# Patient Record
Sex: Female | Born: 1940 | Race: White | Hispanic: No | Marital: Married | Smoking: Former smoker
Health system: Southern US, Community
[De-identification: ages and names within clinical notes are randomized; demographics above are authoritative.]

## PROBLEM LIST (undated history)

## (undated) DIAGNOSIS — I701 Atherosclerosis of renal artery: Secondary | ICD-10-CM

## (undated) DIAGNOSIS — Z923 Personal history of irradiation: Secondary | ICD-10-CM

## (undated) DIAGNOSIS — J9611 Chronic respiratory failure with hypoxia: Secondary | ICD-10-CM

## (undated) DIAGNOSIS — R569 Unspecified convulsions: Secondary | ICD-10-CM

## (undated) DIAGNOSIS — N950 Postmenopausal bleeding: Secondary | ICD-10-CM

## (undated) DIAGNOSIS — Z9981 Dependence on supplemental oxygen: Secondary | ICD-10-CM

## (undated) DIAGNOSIS — Z720 Tobacco use: Secondary | ICD-10-CM

## (undated) DIAGNOSIS — C50919 Malignant neoplasm of unspecified site of unspecified female breast: Secondary | ICD-10-CM

## (undated) DIAGNOSIS — I700 Atherosclerosis of aorta: Secondary | ICD-10-CM

## (undated) DIAGNOSIS — R64 Cachexia: Secondary | ICD-10-CM

## (undated) DIAGNOSIS — I2510 Atherosclerotic heart disease of native coronary artery without angina pectoris: Secondary | ICD-10-CM

## (undated) DIAGNOSIS — J449 Chronic obstructive pulmonary disease, unspecified: Secondary | ICD-10-CM

## (undated) HISTORY — DX: Chronic obstructive pulmonary disease, unspecified: J44.9

## (undated) HISTORY — DX: Dependence on supplemental oxygen: Z99.81

## (undated) HISTORY — DX: Atherosclerosis of aorta: I70.0

## (undated) HISTORY — PX: APPENDECTOMY: SHX54

## (undated) HISTORY — PX: BREAST BIOPSY: SHX20

## (undated) HISTORY — PX: OVARIAN CYST SURGERY: SHX726

## (undated) HISTORY — PX: BREAST LUMPECTOMY: SHX2

## (undated) HISTORY — PX: OTHER SURGICAL HISTORY: SHX169

---

## 2002-09-27 DIAGNOSIS — C50919 Malignant neoplasm of unspecified site of unspecified female breast: Secondary | ICD-10-CM

## 2002-09-27 HISTORY — PX: BREAST BIOPSY: SHX20

## 2002-09-27 HISTORY — DX: Malignant neoplasm of unspecified site of unspecified female breast: C50.919

## 2004-06-27 ENCOUNTER — Ambulatory Visit: Payer: Self-pay | Admitting: Internal Medicine

## 2004-10-02 ENCOUNTER — Ambulatory Visit: Payer: Self-pay | Admitting: Internal Medicine

## 2004-12-22 ENCOUNTER — Ambulatory Visit: Payer: Self-pay | Admitting: Internal Medicine

## 2004-12-26 ENCOUNTER — Ambulatory Visit: Payer: Self-pay | Admitting: Internal Medicine

## 2005-03-01 ENCOUNTER — Ambulatory Visit: Payer: Self-pay | Admitting: Family Medicine

## 2005-06-23 ENCOUNTER — Ambulatory Visit: Payer: Self-pay | Admitting: Internal Medicine

## 2005-06-27 ENCOUNTER — Ambulatory Visit: Payer: Self-pay | Admitting: Internal Medicine

## 2005-07-28 ENCOUNTER — Ambulatory Visit: Payer: Self-pay | Admitting: Internal Medicine

## 2005-08-27 ENCOUNTER — Ambulatory Visit: Payer: Self-pay | Admitting: Internal Medicine

## 2005-09-27 ENCOUNTER — Ambulatory Visit: Payer: Self-pay | Admitting: Internal Medicine

## 2005-10-28 ENCOUNTER — Ambulatory Visit: Payer: Self-pay | Admitting: Internal Medicine

## 2005-11-25 ENCOUNTER — Ambulatory Visit: Payer: Self-pay | Admitting: Internal Medicine

## 2005-12-26 ENCOUNTER — Ambulatory Visit: Payer: Self-pay | Admitting: Internal Medicine

## 2006-03-08 ENCOUNTER — Ambulatory Visit: Payer: Self-pay | Admitting: Internal Medicine

## 2006-03-27 ENCOUNTER — Ambulatory Visit: Payer: Self-pay | Admitting: Internal Medicine

## 2006-09-07 ENCOUNTER — Ambulatory Visit: Payer: Self-pay | Admitting: Internal Medicine

## 2006-09-27 ENCOUNTER — Ambulatory Visit: Payer: Self-pay | Admitting: Internal Medicine

## 2006-12-21 ENCOUNTER — Ambulatory Visit: Payer: Self-pay | Admitting: Internal Medicine

## 2006-12-26 ENCOUNTER — Ambulatory Visit: Payer: Self-pay | Admitting: Internal Medicine

## 2007-02-26 ENCOUNTER — Ambulatory Visit: Payer: Self-pay | Admitting: Internal Medicine

## 2007-03-22 ENCOUNTER — Ambulatory Visit: Payer: Self-pay | Admitting: Internal Medicine

## 2007-03-28 ENCOUNTER — Ambulatory Visit: Payer: Self-pay | Admitting: Internal Medicine

## 2007-07-18 ENCOUNTER — Ambulatory Visit: Payer: Self-pay | Admitting: Family Medicine

## 2007-07-26 ENCOUNTER — Ambulatory Visit: Payer: Self-pay | Admitting: General Surgery

## 2007-09-28 ENCOUNTER — Ambulatory Visit: Payer: Self-pay | Admitting: Internal Medicine

## 2007-10-04 ENCOUNTER — Ambulatory Visit: Payer: Self-pay | Admitting: Internal Medicine

## 2007-10-29 ENCOUNTER — Ambulatory Visit: Payer: Self-pay | Admitting: Internal Medicine

## 2007-11-26 ENCOUNTER — Ambulatory Visit: Payer: Self-pay | Admitting: Internal Medicine

## 2008-01-26 ENCOUNTER — Ambulatory Visit: Payer: Self-pay | Admitting: Internal Medicine

## 2008-01-31 ENCOUNTER — Ambulatory Visit: Payer: Self-pay | Admitting: General Surgery

## 2008-03-27 ENCOUNTER — Ambulatory Visit: Payer: Self-pay | Admitting: Internal Medicine

## 2008-04-02 ENCOUNTER — Ambulatory Visit: Payer: Self-pay | Admitting: Internal Medicine

## 2008-04-27 ENCOUNTER — Ambulatory Visit: Payer: Self-pay | Admitting: Internal Medicine

## 2010-12-25 ENCOUNTER — Ambulatory Visit: Payer: Self-pay | Admitting: Family Medicine

## 2011-01-19 ENCOUNTER — Ambulatory Visit: Payer: Self-pay | Admitting: Family Medicine

## 2011-01-21 ENCOUNTER — Ambulatory Visit: Payer: Self-pay | Admitting: Family Medicine

## 2011-02-01 ENCOUNTER — Ambulatory Visit: Payer: Self-pay | Admitting: Family Medicine

## 2012-02-18 ENCOUNTER — Ambulatory Visit: Payer: Self-pay | Admitting: Family Medicine

## 2012-09-29 ENCOUNTER — Ambulatory Visit: Payer: Self-pay | Admitting: Family Medicine

## 2014-02-25 ENCOUNTER — Ambulatory Visit: Payer: Self-pay | Admitting: Family Medicine

## 2014-11-15 DIAGNOSIS — Z23 Encounter for immunization: Secondary | ICD-10-CM | POA: Diagnosis not present

## 2014-11-15 DIAGNOSIS — M4850XA Collapsed vertebra, not elsewhere classified, site unspecified, initial encounter for fracture: Secondary | ICD-10-CM | POA: Diagnosis not present

## 2014-11-15 DIAGNOSIS — M41125 Adolescent idiopathic scoliosis, thoracolumbar region: Secondary | ICD-10-CM | POA: Diagnosis not present

## 2014-11-15 DIAGNOSIS — E785 Hyperlipidemia, unspecified: Secondary | ICD-10-CM | POA: Diagnosis not present

## 2014-11-15 DIAGNOSIS — J432 Centrilobular emphysema: Secondary | ICD-10-CM | POA: Diagnosis not present

## 2014-11-15 DIAGNOSIS — Z0000 Encounter for general adult medical examination without abnormal findings: Secondary | ICD-10-CM | POA: Diagnosis not present

## 2014-12-03 ENCOUNTER — Ambulatory Visit: Payer: Self-pay | Admitting: Family Medicine

## 2014-12-03 DIAGNOSIS — M810 Age-related osteoporosis without current pathological fracture: Secondary | ICD-10-CM | POA: Diagnosis not present

## 2015-05-11 ENCOUNTER — Other Ambulatory Visit: Payer: Self-pay | Admitting: Family Medicine

## 2015-05-11 DIAGNOSIS — J441 Chronic obstructive pulmonary disease with (acute) exacerbation: Secondary | ICD-10-CM

## 2015-05-12 ENCOUNTER — Other Ambulatory Visit: Payer: Self-pay

## 2015-05-12 DIAGNOSIS — J449 Chronic obstructive pulmonary disease, unspecified: Secondary | ICD-10-CM

## 2015-05-12 MED ORDER — ALBUTEROL SULFATE HFA 108 (90 BASE) MCG/ACT IN AERS: 2.0000 | INHALATION_SPRAY | Freq: Four times a day (QID) | RESPIRATORY_TRACT | Status: DC | PRN

## 2015-06-19 ENCOUNTER — Encounter: Payer: Self-pay | Admitting: Family Medicine

## 2015-06-19 ENCOUNTER — Ambulatory Visit (INDEPENDENT_AMBULATORY_CARE_PROVIDER_SITE_OTHER): Payer: Medicare Other | Admitting: Family Medicine

## 2015-06-19 VITALS — BP 100/60 | HR 98 | Ht 62.0 in | Wt 80.0 lb

## 2015-06-19 DIAGNOSIS — J449 Chronic obstructive pulmonary disease, unspecified: Secondary | ICD-10-CM

## 2015-06-19 DIAGNOSIS — R310 Gross hematuria: Secondary | ICD-10-CM | POA: Diagnosis not present

## 2015-06-19 DIAGNOSIS — N3090 Cystitis, unspecified without hematuria: Secondary | ICD-10-CM

## 2015-06-19 DIAGNOSIS — J441 Chronic obstructive pulmonary disease with (acute) exacerbation: Secondary | ICD-10-CM | POA: Diagnosis not present

## 2015-06-19 LAB — POCT URINALYSIS DIPSTICK
BILIRUBIN UA: NEGATIVE
GLUCOSE UA: NEGATIVE
KETONES UA: NEGATIVE
NITRITE UA: NEGATIVE
PH UA: 6
Protein, UA: NEGATIVE
Spec Grav, UA: 1.01
Urobilinogen, UA: 0.2

## 2015-06-19 MED ORDER — SULFAMETHOXAZOLE-TRIMETHOPRIM 800-160 MG PO TABS: 1.0000 | ORAL_TABLET | Freq: Two times a day (BID) | ORAL | Status: DC

## 2015-06-19 MED ORDER — ALBUTEROL SULFATE HFA 108 (90 BASE) MCG/ACT IN AERS: 1.0000 | INHALATION_SPRAY | RESPIRATORY_TRACT | Status: DC | PRN

## 2015-06-19 NOTE — Progress Notes (Signed)
Name: Gabriella Wiggins   MRN: [id number]    DOB: [birth year]   Date:06/19/2015       Progress Note  Subjective  Chief Complaint  Chief Complaint  Patient presents with  . Urinary Tract Infection    blood in urine- started 2 days ago    Urinary Tract Infection  Associated symptoms include frequency and hematuria. Pertinent negatives include no chills, discharge, hesitancy, nausea, possible pregnancy or urgency.  Urinary Frequency  This is a new problem. The current episode started in the past 7 days. The problem occurs intermittently. The problem has been gradually improving. Quality: nodysuria. Associated symptoms include frequency and hematuria. Pertinent negatives include no chills, discharge, hesitancy, nausea, possible pregnancy or urgency. She has tried acetaminophen for the symptoms. The treatment provided mild relief.    No problem-specific assessment & plan notes found for this encounter.   Past Medical History  Diagnosis Date  . COPD (chronic obstructive pulmonary disease)   . Cancer 2003    breast    Past Surgical History  Procedure Laterality Date  . Ovarian cyst surgery      Family History  Problem Relation Age of Onset  . Heart disease Father   . Cancer Sister   . Diabetes Brother     Social History   Social History  . Marital Status: Married    Spouse Name: N/A  . Number of Children: N/A  . Years of Education: N/A   Occupational History  . Not on file.   Social History Main Topics  . Smoking status: Current Every Day Smoker  . Smokeless tobacco: Not on file  . Alcohol Use: No  . Drug Use: No  . Sexual Activity: Yes   Other Topics Concern  . Not on file   Social History Narrative  . No narrative on file    Allergies  Allergen Reactions  . Penicillins      Review of Systems  Constitutional: Negative for fever, chills, weight loss and malaise/fatigue.  HENT: Negative for ear discharge, ear pain and sore throat.   Eyes: Negative for blurred  vision.  Respiratory: Negative for cough, sputum production, shortness of breath and wheezing.   Cardiovascular: Negative for chest pain, palpitations and leg swelling.  Gastrointestinal: Negative for heartburn, nausea, abdominal pain, diarrhea, constipation, blood in stool and melena.  Genitourinary: Positive for frequency and hematuria. Negative for dysuria, hesitancy and urgency.  Musculoskeletal: Negative for myalgias, back pain, joint pain and neck pain.  Skin: Negative for rash.  Neurological: Negative for dizziness, tingling, sensory change, focal weakness and headaches.  Endo/Heme/Allergies: Negative for environmental allergies and polydipsia. Does not bruise/bleed easily.  Psychiatric/Behavioral: Negative for depression and suicidal ideas. The patient is not nervous/anxious and does not have insomnia.      Objective  Filed Vitals:   06/19/15 1104  BP: 100/60  Pulse: 98  Height: 5\' 2"  (1.575 m)  Weight: 80 lb (36.288 kg)    Physical Exam  Constitutional: She is well-developed, well-nourished, and in no distress. No distress.  HENT:  Head: Normocephalic and atraumatic.  Right Ear: External ear normal.  Left Ear: External ear normal.  Nose: Nose normal.  Mouth/Throat: Oropharynx is clear and moist.  Eyes: Conjunctivae and EOM are normal. Pupils are equal, round, and reactive to light. Right eye exhibits no discharge. Left eye exhibits no discharge.  Neck: Normal range of motion. Neck supple. No JVD present. No thyromegaly present.  Cardiovascular: Normal rate, regular rhythm, normal heart sounds and intact distal  pulses.  Exam reveals no gallop and no friction rub.   No murmur heard. Pulmonary/Chest: Effort normal and breath sounds normal.  Abdominal: Soft. Bowel sounds are normal. She exhibits no mass. There is no tenderness. There is no guarding.  Musculoskeletal: Normal range of motion. She exhibits no edema.  Lymphadenopathy:    She has no cervical adenopathy.   Neurological: She is alert. She has normal reflexes.  Skin: Skin is warm and dry. She is not diaphoretic.  Psychiatric: Mood and affect normal.      Assessment & Plan  Problem List Items Addressed This Visit    None    Visit Diagnoses    Cystitis    -  Primary    Relevant Medications    sulfamethoxazole-trimethoprim (BACTRIM DS,SEPTRA DS) 800-160 MG per tablet    Other Relevant Orders    POCT Urinalysis Dipstick (Completed)    Gross hematuria        Relevant Medications    sulfamethoxazole-trimethoprim (BACTRIM DS,SEPTRA DS) 800-160 MG per tablet    Other Relevant Orders    POCT Urinalysis Dipstick (Completed)    Ambulatory referral to Urology    Chronic obstructive pulmonary disease, unspecified COPD, unspecified chronic bronchitis type        Relevant Medications    albuterol (PROAIR HFA) 108 (90 BASE) MCG/ACT inhaler    Chronic obstructive pulmonary disease with acute exacerbation        Relevant Medications    albuterol (PROAIR HFA) 108 (90 BASE) MCG/ACT inhaler         Dr. Deanna Jones [facility]  06/19/2015

## 2015-06-26 ENCOUNTER — Ambulatory Visit (INDEPENDENT_AMBULATORY_CARE_PROVIDER_SITE_OTHER): Payer: Medicare Other

## 2015-06-26 DIAGNOSIS — R319 Hematuria, unspecified: Secondary | ICD-10-CM

## 2015-06-26 LAB — POCT URINALYSIS DIPSTICK
Bilirubin, UA: NEGATIVE
Glucose, UA: NEGATIVE
Ketones, UA: NEGATIVE
Leukocytes, UA: NEGATIVE
NITRITE UA: NEGATIVE
PH UA: 6
PROTEIN UA: NEGATIVE
SPEC GRAV UA: 1.01
UROBILINOGEN UA: 0.2

## 2015-06-26 NOTE — Progress Notes (Signed)
Pt dropped off urine to be rechecked- will proceed with appointment with Dr Bernardo Heater

## 2015-07-24 DIAGNOSIS — R310 Gross hematuria: Secondary | ICD-10-CM | POA: Diagnosis not present

## 2015-07-24 DIAGNOSIS — R319 Hematuria, unspecified: Secondary | ICD-10-CM | POA: Diagnosis not present

## 2015-07-24 HISTORY — DX: Gross hematuria: R31.0

## 2015-08-05 DIAGNOSIS — R310 Gross hematuria: Secondary | ICD-10-CM | POA: Diagnosis not present

## 2015-08-28 DIAGNOSIS — R310 Gross hematuria: Secondary | ICD-10-CM | POA: Diagnosis not present

## 2015-10-06 ENCOUNTER — Ambulatory Visit: Payer: Medicare Other | Admitting: Family Medicine

## 2015-10-08 ENCOUNTER — Encounter: Payer: Self-pay | Admitting: Family Medicine

## 2015-10-08 ENCOUNTER — Ambulatory Visit (INDEPENDENT_AMBULATORY_CARE_PROVIDER_SITE_OTHER): Payer: PPO | Admitting: Family Medicine

## 2015-10-08 VITALS — BP 120/80 | HR 72 | Ht 62.0 in | Wt 83.0 lb

## 2015-10-08 DIAGNOSIS — J449 Chronic obstructive pulmonary disease, unspecified: Secondary | ICD-10-CM | POA: Diagnosis not present

## 2015-10-08 DIAGNOSIS — Z789 Other specified health status: Secondary | ICD-10-CM

## 2015-10-08 DIAGNOSIS — Z13220 Encounter for screening for lipoid disorders: Secondary | ICD-10-CM | POA: Diagnosis not present

## 2015-10-08 MED ORDER — ALBUTEROL SULFATE HFA 108 (90 BASE) MCG/ACT IN AERS: 2.0000 | INHALATION_SPRAY | Freq: Four times a day (QID) | RESPIRATORY_TRACT | Status: DC | PRN

## 2015-10-08 MED ORDER — FLUTICASONE-SALMETEROL 250-50 MCG/DOSE IN AEPB: 1.0000 | INHALATION_SPRAY | Freq: Two times a day (BID) | RESPIRATORY_TRACT | Status: DC

## 2015-10-08 MED ORDER — IPRATROPIUM-ALBUTEROL 20-100 MCG/ACT IN AERS: 1.0000 | INHALATION_SPRAY | Freq: Four times a day (QID) | RESPIRATORY_TRACT | Status: DC

## 2015-10-08 NOTE — Progress Notes (Signed)
Name: Gabriella Wiggins   MRN: [id number]    DOB: 1941/03/25   Date:10/08/2015       Progress Note  Subjective  Chief Complaint  Chief Complaint  Patient presents with  . COPD    Shortness of Breath Pertinent negatives include no abdominal pain, chest pain, ear pain, fever, headaches, hemoptysis, leg swelling, neck pain, rash, sore throat, sputum production or wheezing.    No problem-specific assessment & plan notes found for this encounter.   Past Medical History  Diagnosis Date  . COPD (chronic obstructive pulmonary disease) ([place])   . Cancer [facility]) 2003    breast    Past Surgical History  Procedure Laterality Date  . Ovarian cyst surgery      Family History  Problem Relation Age of Onset  . Heart disease Father   . Cancer Sister   . Diabetes Brother     Social History   Social History  . Marital Status: Married    Spouse Name: N/A  . Number of Children: N/A  . Years of Education: N/A   Occupational History  . Not on file.   Social History Main Topics  . Smoking status: Current Every Day Smoker  . Smokeless tobacco: Not on file  . Alcohol Use: No  . Drug Use: No  . Sexual Activity: Yes   Other Topics Concern  . Not on file   Social History Narrative    Allergies  Allergen Reactions  . Penicillins      Review of Systems  Constitutional: Negative for fever, chills, weight loss and malaise/fatigue.  HENT: Negative for ear discharge, ear pain and sore throat.   Eyes: Negative for blurred vision.  Respiratory: Positive for shortness of breath. Negative for cough, hemoptysis, sputum production and wheezing.   Cardiovascular: Negative for chest pain, palpitations and leg swelling.  Gastrointestinal: Negative for heartburn, nausea, abdominal pain, diarrhea, constipation, blood in stool and melena.  Genitourinary: Negative for dysuria, urgency, frequency and hematuria.  Musculoskeletal: Negative for myalgias, back pain, joint pain and neck pain.  Skin:  Negative for rash.  Neurological: Negative for dizziness, tingling, sensory change, focal weakness and headaches.  Endo/Heme/Allergies: Negative for environmental allergies and polydipsia. Does not bruise/bleed easily.  Psychiatric/Behavioral: Negative for depression and suicidal ideas. The patient is not nervous/anxious and does not have insomnia.      Objective  Filed Vitals:   10/08/15 1109  BP: 120/80  Pulse: 72  Height: 5\' 2"  (1.575 m)  Weight: 83 lb (37.649 kg)    Physical Exam  Constitutional: She is well-developed, well-nourished, and in no distress. No distress.  HENT:  Head: Normocephalic and atraumatic.  Right Ear: External ear normal.  Left Ear: External ear normal.  Nose: Nose normal.  Mouth/Throat: Oropharynx is clear and moist.  Eyes: Conjunctivae and EOM are normal. Pupils are equal, round, and reactive to light. Right eye exhibits no discharge. Left eye exhibits no discharge.  Neck: Normal range of motion. Neck supple. No JVD present. No thyromegaly present.  Cardiovascular: Normal rate, regular rhythm, normal heart sounds and intact distal pulses.  Exam reveals no gallop and no friction rub.   No murmur heard. Pulmonary/Chest: Effort normal and breath sounds normal. No respiratory distress. She has no wheezes. She has no rales. She exhibits no tenderness.  Abdominal: Soft. Bowel sounds are normal. She exhibits no mass. There is no tenderness. There is no guarding.  Musculoskeletal: Normal range of motion. She exhibits no edema.  Lymphadenopathy:    She  has no cervical adenopathy.  Neurological: She is alert. She has normal reflexes.  Skin: Skin is warm and dry. She is not diaphoretic.  Psychiatric: Mood and affect normal.  Nursing note and vitals reviewed.     Assessment & Plan  Problem List Items Addressed This Visit    None    Visit Diagnoses    Chronic obstructive pulmonary disease, unspecified COPD type ([place])    -  Primary    Relevant Medications     Ipratropium-Albuterol (COMBIVENT RESPIMAT) 20-100 MCG/ACT AERS respimat    albuterol (PROVENTIL HFA;VENTOLIN HFA) 108 (90 Base) MCG/ACT inhaler    Fluticasone-Salmeterol (ADVAIR DISKUS) 250-50 MCG/DOSE AEPB    Chronic obstructive pulmonary disease, unspecified COPD, unspecified chronic bronchitis type        Relevant Medications    Ipratropium-Albuterol (COMBIVENT RESPIMAT) 20-100 MCG/ACT AERS respimat    albuterol (PROVENTIL HFA;VENTOLIN HFA) 108 (90 Base) MCG/ACT inhaler    Fluticasone-Salmeterol (ADVAIR DISKUS) 250-50 MCG/DOSE AEPB    Lipid screening        Relevant Orders    Lipid Profile    Health maintenance alteration        Relevant Orders    Renal Function Panel    Lipid Profile         Dr. Otilio Miu [facility]  10/08/2015

## 2015-10-09 LAB — RENAL FUNCTION PANEL
ALBUMIN: 4.5 g/dL (ref 3.5–4.8)
BUN/Creatinine Ratio: 21 (ref 11–26)
BUN: 15 mg/dL (ref 8–27)
CHLORIDE: 94 mmol/L — AB (ref 96–106)
CO2: 29 mmol/L (ref 18–29)
Calcium: 10 mg/dL (ref 8.7–10.3)
Creatinine, Ser: 0.72 mg/dL (ref 0.57–1.00)
GFR, EST AFRICAN AMERICAN: 97 mL/min/{1.73_m2} (ref >59)
GFR, EST NON AFRICAN AMERICAN: 84 mL/min/{1.73_m2} (ref >59)
Glucose: 73 mg/dL (ref 65–99)
Phosphorus: 3.5 mg/dL (ref 2.5–4.5)
Potassium: 4.5 mmol/L (ref 3.5–5.2)
Sodium: 141 mmol/L (ref 134–144)

## 2015-10-09 LAB — LIPID PANEL
Chol/HDL Ratio: 3.4 ratio units (ref 0.0–4.4)
Cholesterol, Total: 244 mg/dL — ABNORMAL HIGH (ref 100–199)
HDL: 71 mg/dL (ref >39)
LDL Calculated: 153 mg/dL — ABNORMAL HIGH (ref 0–99)
TRIGLYCERIDES: 99 mg/dL (ref 0–149)
VLDL CHOLESTEROL CAL: 20 mg/dL (ref 5–40)

## 2016-04-06 DIAGNOSIS — H524 Presbyopia: Secondary | ICD-10-CM | POA: Diagnosis not present

## 2016-04-06 DIAGNOSIS — H353121 Nonexudative age-related macular degeneration, left eye, early dry stage: Secondary | ICD-10-CM | POA: Diagnosis not present

## 2016-04-06 DIAGNOSIS — H25813 Combined forms of age-related cataract, bilateral: Secondary | ICD-10-CM | POA: Diagnosis not present

## 2016-04-08 ENCOUNTER — Encounter: Payer: Self-pay | Admitting: Family Medicine

## 2016-04-08 ENCOUNTER — Ambulatory Visit (INDEPENDENT_AMBULATORY_CARE_PROVIDER_SITE_OTHER): Payer: PPO | Admitting: Family Medicine

## 2016-04-08 VITALS — BP 124/70 | HR 84 | Ht 62.0 in | Wt 85.0 lb

## 2016-04-08 DIAGNOSIS — Z1239 Encounter for other screening for malignant neoplasm of breast: Secondary | ICD-10-CM | POA: Diagnosis not present

## 2016-04-08 DIAGNOSIS — J449 Chronic obstructive pulmonary disease, unspecified: Secondary | ICD-10-CM

## 2016-04-08 DIAGNOSIS — J432 Centrilobular emphysema: Secondary | ICD-10-CM

## 2016-04-08 MED ORDER — FLUTICASONE-SALMETEROL 250-50 MCG/DOSE IN AEPB: 1.0000 | INHALATION_SPRAY | Freq: Two times a day (BID) | RESPIRATORY_TRACT | Status: DC

## 2016-04-08 MED ORDER — IPRATROPIUM-ALBUTEROL 20-100 MCG/ACT IN AERS: 1.0000 | INHALATION_SPRAY | Freq: Four times a day (QID) | RESPIRATORY_TRACT | Status: DC

## 2016-04-08 MED ORDER — ALBUTEROL SULFATE HFA 108 (90 BASE) MCG/ACT IN AERS: 2.0000 | INHALATION_SPRAY | Freq: Four times a day (QID) | RESPIRATORY_TRACT | Status: DC | PRN

## 2016-04-08 NOTE — Progress Notes (Signed)
Name: Gabriella Wiggins   MRN: [id number]    DOB: 1941/07/13   Date:04/08/2016       Progress Note  Subjective  Chief Complaint  Chief Complaint  Patient presents with  . COPD    Shortness of Breath This is a chronic problem. The current episode started more than 1 year ago. The problem occurs intermittently. The problem has been waxing and waning. Associated symptoms include coryza. Pertinent negatives include no abdominal pain, chest pain, claudication, ear pain, fever, headaches, hemoptysis, leg pain, leg swelling, neck pain, orthopnea, PND, rash, rhinorrhea, sore throat, sputum production, swollen glands, syncope, vomiting or wheezing. The symptoms are aggravated by weather changes. The patient has no known risk factors for DVT/PE. She has tried beta agonist inhalers and steroid inhalers for the symptoms. The treatment provided moderate relief. Her past medical history is significant for asthma and COPD. There is no history of allergies, aspirin allergies, bronchiolitis, CAD, chronic lung disease, DVT, a heart failure, PE, pneumonia or a recent surgery.    No problem-specific assessment & plan notes found for this encounter.   Past Medical History  Diagnosis Date  . COPD (chronic obstructive pulmonary disease) ([place])   . Cancer [facility]) 2003    breast    Past Surgical History  Procedure Laterality Date  . Ovarian cyst surgery      Family History  Problem Relation Age of Onset  . Heart disease Father   . Cancer Sister   . Diabetes Brother     Social History   Social History  . Marital Status: Married    Spouse Name: N/A  . Number of Children: N/A  . Years of Education: N/A   Occupational History  . Not on file.   Social History Main Topics  . Smoking status: Current Every Day Smoker  . Smokeless tobacco: Not on file  . Alcohol Use: No  . Drug Use: No  . Sexual Activity: Yes   Other Topics Concern  . Not on file   Social History Narrative    Allergies  Allergen  Reactions  . Penicillins      Review of Systems  Constitutional: Negative for fever, chills, weight loss and malaise/fatigue.  HENT: Negative for ear discharge, ear pain, rhinorrhea and sore throat.   Eyes: Negative for blurred vision.  Respiratory: Positive for shortness of breath. Negative for cough, hemoptysis, sputum production and wheezing.   Cardiovascular: Negative for chest pain, palpitations, orthopnea, claudication, leg swelling, syncope and PND.  Gastrointestinal: Negative for heartburn, nausea, vomiting, abdominal pain, diarrhea, constipation, blood in stool and melena.  Genitourinary: Negative for dysuria, urgency, frequency and hematuria.  Musculoskeletal: Negative for myalgias, back pain, joint pain and neck pain.  Skin: Negative for rash.  Neurological: Negative for dizziness, tingling, sensory change, focal weakness and headaches.  Endo/Heme/Allergies: Negative for environmental allergies and polydipsia. Does not bruise/bleed easily.  Psychiatric/Behavioral: Negative for depression and suicidal ideas. The patient is not nervous/anxious and does not have insomnia.      Objective  Filed Vitals:   04/08/16 1013  BP: 124/70  Pulse: 84  Height: 5\' 2"  (1.575 m)  Weight: 85 lb (38.556 kg)    Physical Exam  Constitutional: She is well-developed, well-nourished, and in no distress. No distress.  HENT:  Head: Normocephalic and atraumatic.  Right Ear: Tympanic membrane, external ear and ear canal normal.  Left Ear: Tympanic membrane, external ear and ear canal normal.  Nose: Nose normal.  Mouth/Throat: Oropharynx is clear and moist. No posterior  oropharyngeal edema or posterior oropharyngeal erythema.  Eyes: Conjunctivae and EOM are normal. Pupils are equal, round, and reactive to light. Right eye exhibits no discharge. Left eye exhibits no discharge.  Neck: Normal range of motion. Neck supple. No JVD present. No thyromegaly present.  Cardiovascular: Normal rate,  regular rhythm, normal heart sounds and intact distal pulses.  Exam reveals no gallop and no friction rub.   No murmur heard. Pulmonary/Chest: Effort normal and breath sounds normal. No respiratory distress. She has no wheezes. She has no rales. She exhibits no tenderness. Right breast exhibits no inverted nipple, no mass, no nipple discharge, no skin change and no tenderness. Left breast exhibits no inverted nipple, no mass, no nipple discharge, no skin change and no tenderness. Breasts are symmetrical.  Abdominal: Soft. Bowel sounds are normal. She exhibits no mass. There is no hepatosplenomegaly. There is no tenderness. There is no guarding.  Musculoskeletal: Normal range of motion. She exhibits no edema.  Lymphadenopathy:    She has no cervical adenopathy.  Neurological: She is alert. She has normal reflexes.  Skin: Skin is warm and dry. She is not diaphoretic.  Psychiatric: Mood and affect normal.  Nursing note and vitals reviewed.     Assessment & Plan  Problem List Items Addressed This Visit    None    Visit Diagnoses    Centrilobular emphysema (HCC)    -  Primary    Relevant Medications    albuterol (PROVENTIL HFA;VENTOLIN HFA) 108 (90 Base) MCG/ACT inhaler    Fluticasone-Salmeterol (ADVAIR DISKUS) 250-50 MCG/DOSE AEPB    Ipratropium-Albuterol (COMBIVENT RESPIMAT) 20-100 MCG/ACT AERS respimat    Chronic obstructive pulmonary disease, unspecified COPD, unspecified chronic bronchitis type        Relevant Medications    albuterol (PROVENTIL HFA;VENTOLIN HFA) 108 (90 Base) MCG/ACT inhaler    Fluticasone-Salmeterol (ADVAIR DISKUS) 250-50 MCG/DOSE AEPB    Ipratropium-Albuterol (COMBIVENT RESPIMAT) 20-100 MCG/ACT AERS respimat    Breast cancer screening        Relevant Orders    MM Digital Screening         Dr. Deanna Jones [facility]  04/08/2016

## 2016-04-16 DIAGNOSIS — H3530 Unspecified macular degeneration: Secondary | ICD-10-CM | POA: Diagnosis not present

## 2016-04-16 DIAGNOSIS — H25812 Combined forms of age-related cataract, left eye: Secondary | ICD-10-CM | POA: Diagnosis not present

## 2016-04-16 DIAGNOSIS — H25811 Combined forms of age-related cataract, right eye: Secondary | ICD-10-CM | POA: Diagnosis not present

## 2016-04-22 ENCOUNTER — Ambulatory Visit
Admission: RE | Admit: 2016-04-22 | Discharge: 2016-04-22 | Disposition: A | Discharge status: Home / Self Care | Payer: PPO | Source: Ambulatory Visit | Attending: Family Medicine | Admitting: Family Medicine

## 2016-04-22 DIAGNOSIS — Z1239 Encounter for other screening for malignant neoplasm of breast: Secondary | ICD-10-CM | POA: Diagnosis not present

## 2016-04-22 DIAGNOSIS — Z1231 Encounter for screening mammogram for malignant neoplasm of breast: Secondary | ICD-10-CM | POA: Diagnosis not present

## 2016-04-22 HISTORY — DX: Malignant neoplasm of unspecified site of unspecified female breast: C50.919

## 2016-04-29 DIAGNOSIS — H25812 Combined forms of age-related cataract, left eye: Secondary | ICD-10-CM | POA: Diagnosis not present

## 2016-04-29 DIAGNOSIS — H2512 Age-related nuclear cataract, left eye: Secondary | ICD-10-CM | POA: Diagnosis not present

## 2016-07-01 DIAGNOSIS — H25811 Combined forms of age-related cataract, right eye: Secondary | ICD-10-CM | POA: Diagnosis not present

## 2016-07-01 DIAGNOSIS — H2511 Age-related nuclear cataract, right eye: Secondary | ICD-10-CM | POA: Diagnosis not present

## 2016-07-12 DIAGNOSIS — H25811 Combined forms of age-related cataract, right eye: Secondary | ICD-10-CM | POA: Diagnosis not present

## 2016-07-12 DIAGNOSIS — Z961 Presence of intraocular lens: Secondary | ICD-10-CM | POA: Diagnosis not present

## 2016-09-09 ENCOUNTER — Ambulatory Visit (INDEPENDENT_AMBULATORY_CARE_PROVIDER_SITE_OTHER): Payer: PPO | Admitting: Family Medicine

## 2016-09-09 ENCOUNTER — Encounter: Payer: Self-pay | Admitting: Family Medicine

## 2016-09-09 DIAGNOSIS — J432 Centrilobular emphysema: Secondary | ICD-10-CM

## 2016-09-09 MED ORDER — IPRATROPIUM-ALBUTEROL 20-100 MCG/ACT IN AERS: 1.0000 | INHALATION_SPRAY | Freq: Four times a day (QID) | RESPIRATORY_TRACT | 6 refills | Status: DC

## 2016-09-09 MED ORDER — FLUTICASONE-SALMETEROL 250-50 MCG/DOSE IN AEPB: 1.0000 | INHALATION_SPRAY | Freq: Two times a day (BID) | RESPIRATORY_TRACT | 6 refills | Status: DC

## 2016-09-09 MED ORDER — ALBUTEROL SULFATE HFA 108 (90 BASE) MCG/ACT IN AERS: 2.0000 | INHALATION_SPRAY | Freq: Four times a day (QID) | RESPIRATORY_TRACT | 6 refills | Status: DC | PRN

## 2016-09-09 NOTE — Progress Notes (Signed)
Name: Gabriella Wiggins   MRN: [id number]    DOB: 05/26/1941   Date:09/09/2016       Progress Note  Subjective  Chief Complaint  Chief Complaint  Patient presents with  . COPD    Patient presents for copd evaluation and medication refill.    No problem-specific Assessment & Plan notes found for this encounter.   Past Medical History:  Diagnosis Date  . Breast cancer ([place])    left lumpectomy  . Cancer [facility]) 2003   breast  . COPD (chronic obstructive pulmonary disease) ([place])     Past Surgical History:  Procedure Laterality Date  . BREAST BIOPSY Right    milk duct removed -neg  . BREAST BIOPSY Left 2004   lumpectomy/rad  . OVARIAN CYST SURGERY      Family History  Problem Relation Age of Onset  . Heart disease Father   . Cancer Sister   . Breast cancer Sister     2 sisters  . Diabetes Brother   . Breast cancer Maternal Aunt   . Breast cancer Paternal Aunt   . Breast cancer Cousin     Social History   Social History  . Marital status: Married    Spouse name: N/A  . Number of children: N/A  . Years of education: N/A   Occupational History  . Not on file.   Social History Main Topics  . Smoking status: Current Every Day Smoker  . Smokeless tobacco: Not on file  . Alcohol use No  . Drug use: No  . Sexual activity: Yes   Other Topics Concern  . Not on file   Social History Narrative  . No narrative on file    Allergies  Allergen Reactions  . Penicillins      Review of Systems  Constitutional: Negative for chills, fever, malaise/fatigue and weight loss.  HENT: Negative for ear discharge, ear pain and sore throat.   Eyes: Negative for blurred vision.  Respiratory: Negative for cough, sputum production, shortness of breath and wheezing.   Cardiovascular: Negative for chest pain, palpitations and leg swelling.  Gastrointestinal: Negative for abdominal pain, blood in stool, constipation, diarrhea, heartburn, melena and nausea.  Genitourinary:  Negative for dysuria, frequency, hematuria and urgency.  Musculoskeletal: Negative for back pain, joint pain, myalgias and neck pain.  Skin: Negative for rash.  Neurological: Negative for dizziness, tingling, sensory change, focal weakness and headaches.  Endo/Heme/Allergies: Negative for environmental allergies and polydipsia. Does not bruise/bleed easily.  Psychiatric/Behavioral: Negative for depression and suicidal ideas. The patient is not nervous/anxious and does not have insomnia.      Objective  Vitals:   09/09/16 1004  BP: 130/86  Pulse: 88  Weight: 83 lb (37.6 kg)  Height: 5\' 2"  (1.575 m)    Physical Exam  Constitutional: She is well-developed, well-nourished, and in no distress. No distress.  HENT:  Head: Normocephalic and atraumatic.  Right Ear: External ear normal.  Left Ear: External ear normal.  Nose: Nose normal.  Mouth/Throat: Oropharynx is clear and moist.  Eyes: Conjunctivae and EOM are normal. Pupils are equal, round, and reactive to light. Right eye exhibits no discharge. Left eye exhibits no discharge.  Neck: Normal range of motion. Neck supple. No JVD present. No thyromegaly present.  Cardiovascular: Normal rate, regular rhythm, normal heart sounds and intact distal pulses.  Exam reveals no gallop and no friction rub.   No murmur heard. Pulmonary/Chest: Effort normal and breath sounds normal. No respiratory distress. She has no  wheezes. She has no rales. She exhibits no tenderness.  Abdominal: Soft. Bowel sounds are normal. She exhibits no mass. There is no tenderness. There is no guarding.  Musculoskeletal: Normal range of motion. She exhibits no edema.  Lymphadenopathy:    She has no cervical adenopathy.  Neurological: She is alert.  Skin: Skin is warm and dry. She is not diaphoretic.  Psychiatric: Mood and affect normal.  Nursing note and vitals reviewed.     Assessment & Plan  Problem List Items Addressed This Visit      Respiratory    Centrilobular emphysema (HCC)   Relevant Medications   albuterol (PROVENTIL HFA;VENTOLIN HFA) 108 (90 Base) MCG/ACT inhaler   Fluticasone-Salmeterol (ADVAIR DISKUS) 250-50 MCG/DOSE AEPB   Ipratropium-Albuterol (COMBIVENT RESPIMAT) 20-100 MCG/ACT AERS respimat        Dr. Macon [facility]  09/09/16

## 2017-03-28 ENCOUNTER — Other Ambulatory Visit: Payer: Self-pay | Admitting: Family Medicine

## 2017-03-28 DIAGNOSIS — Z1231 Encounter for screening mammogram for malignant neoplasm of breast: Secondary | ICD-10-CM

## 2017-04-04 ENCOUNTER — Ambulatory Visit
Admission: RE | Admit: 2017-04-04 | Discharge: 2017-04-04 | Disposition: A | Discharge status: Home / Self Care | Payer: PPO | Source: Ambulatory Visit | Attending: Family Medicine | Admitting: Family Medicine

## 2017-04-04 ENCOUNTER — Ambulatory Visit (INDEPENDENT_AMBULATORY_CARE_PROVIDER_SITE_OTHER): Payer: PPO | Admitting: Family Medicine

## 2017-04-04 ENCOUNTER — Encounter: Payer: Self-pay | Admitting: Family Medicine

## 2017-04-04 VITALS — BP 120/84 | HR 80 | Ht 62.0 in | Wt 85.0 lb

## 2017-04-04 DIAGNOSIS — R05 Cough: Secondary | ICD-10-CM | POA: Diagnosis not present

## 2017-04-04 DIAGNOSIS — F17210 Nicotine dependence, cigarettes, uncomplicated: Secondary | ICD-10-CM

## 2017-04-04 DIAGNOSIS — J432 Centrilobular emphysema: Secondary | ICD-10-CM | POA: Insufficient documentation

## 2017-04-04 DIAGNOSIS — W57XXXA Bitten or stung by nonvenomous insect and other nonvenomous arthropods, initial encounter: Secondary | ICD-10-CM | POA: Diagnosis not present

## 2017-04-04 DIAGNOSIS — J40 Bronchitis, not specified as acute or chronic: Secondary | ICD-10-CM | POA: Diagnosis not present

## 2017-04-04 DIAGNOSIS — Z23 Encounter for immunization: Secondary | ICD-10-CM

## 2017-04-04 DIAGNOSIS — I700 Atherosclerosis of aorta: Secondary | ICD-10-CM

## 2017-04-04 MED ORDER — BUPROPION HCL 75 MG PO TABS: 75.0000 mg | ORAL_TABLET | Freq: Two times a day (BID) | ORAL | 6 refills | Status: DC

## 2017-04-04 MED ORDER — ALBUTEROL SULFATE HFA 108 (90 BASE) MCG/ACT IN AERS: 2.0000 | INHALATION_SPRAY | Freq: Four times a day (QID) | RESPIRATORY_TRACT | 6 refills | Status: DC | PRN

## 2017-04-04 MED ORDER — IPRATROPIUM-ALBUTEROL 20-100 MCG/ACT IN AERS: 1.0000 | INHALATION_SPRAY | Freq: Four times a day (QID) | RESPIRATORY_TRACT | 6 refills | Status: DC

## 2017-04-04 NOTE — Progress Notes (Signed)
Name: Gabriella Wiggins   MRN: [id number]    DOB: Apr 24, 1941   Date:04/04/2017       Progress Note  Subjective  Chief Complaint  Chief Complaint  Patient presents with  . COPD  . Nicotine Dependence    would like to try Wellbutrin    Nicotine Dependence  Presents for initial visit. Symptoms include cravings. Symptoms are negative for insomnia and sore throat. Preferred tobacco types include cigarettes. Preferred cigarette types include filtered. Her urge triggers include company of smokers. She smokes < 1/2 a pack of cigarettes per day. She started smoking when she was 28-47 years old. Past treatments include nicotine patch. Compliance with prior treatments has been good. Gabriella Wiggins is ready to quit. Gabriella Wiggins has tried to quit 5 or more times.  Shortness of Breath  This is a chronic problem. The current episode started more than 1 year ago. The problem occurs daily. The problem has been gradually worsening. Pertinent negatives include no abdominal pain, chest pain, ear pain, fever, headaches, leg swelling, neck pain, rash, sore throat, sputum production or wheezing. Risk factors include smoking. She has tried beta agonist inhalers and steroid inhalers for the symptoms. The treatment provided moderate relief. Her past medical history is significant for COPD.  Cough  This is a chronic problem. The current episode started more than 1 month ago. The problem has been waxing and waning. Episode frequency: primarily every morning. The cough is non-productive. Associated symptoms include shortness of breath. Pertinent negatives include no chest pain, chills, ear pain, fever, headaches, heartburn, myalgias, rash, sore throat, weight loss or wheezing. She has tried a beta-agonist inhaler and steroid inhaler for the symptoms. The treatment provided mild relief. Her past medical history is significant for COPD. There is no history of environmental allergies.  Rash  This is a new problem. The current episode started in the  past 7 days. The problem is unchanged. The affected locations include the left shoulder. The rash is characterized by redness and itchiness. Associated symptoms include shortness of breath. Pertinent negatives include no cough, diarrhea, fever, joint pain or sore throat.    No problem-specific Assessment & Plan notes found for this encounter.   Past Medical History:  Diagnosis Date  . Breast cancer ([place])    left lumpectomy  . Cancer [facility]) 2003   breast  . COPD (chronic obstructive pulmonary disease) ([place])     Past Surgical History:  Procedure Laterality Date  . BREAST BIOPSY Right    milk duct removed -neg  . BREAST BIOPSY Left 2004   lumpectomy/rad  . OVARIAN CYST SURGERY      Family History  Problem Relation Age of Onset  . Heart disease Father   . Cancer Sister   . Breast cancer Sister        2 sisters  . Diabetes Brother   . Breast cancer Maternal Aunt   . Breast cancer Paternal Aunt   . Breast cancer Cousin     Social History   Social History  . Marital status: Married    Spouse name: N/A  . Number of children: N/A  . Years of education: N/A   Occupational History  . Not on file.   Social History Main Topics  . Smoking status: Current Every Day Smoker  . Smokeless tobacco: Never Used     Comment: chantix didn't help and patches- she is allergic to the adhesive  . Alcohol use No  . Drug use: No  . Sexual activity: Yes  Other Topics Concern  . Not on file   Social History Narrative  . No narrative on file    Allergies  Allergen Reactions  . Penicillins     Outpatient Medications Prior to Visit  Medication Sig Dispense Refill  . albuterol (PROVENTIL HFA;VENTOLIN HFA) 108 (90 Base) MCG/ACT inhaler Inhale 2 puffs into the lungs every 6 (six) hours as needed for wheezing or shortness of breath. 1 Inhaler 6  . Ipratropium-Albuterol (COMBIVENT RESPIMAT) 20-100 MCG/ACT AERS respimat Inhale 1 puff into the lungs every 6 (six) hours. 1 Inhaler 6  .  Fluticasone-Salmeterol (ADVAIR DISKUS) 250-50 MCG/DOSE AEPB Inhale 1 puff into the lungs 2 (two) times daily. (Patient not taking: Reported on 04/04/2017) 1 each 6   No facility-administered medications prior to visit.     Review of Systems  Constitutional: Negative for chills, fever, malaise/fatigue and weight loss.  HENT: Negative for ear discharge, ear pain and sore throat.   Eyes: Negative for blurred vision.  Respiratory: Positive for shortness of breath. Negative for cough, sputum production and wheezing.   Cardiovascular: Negative for chest pain, palpitations and leg swelling.  Gastrointestinal: Negative for abdominal pain, blood in stool, constipation, diarrhea, heartburn, melena and nausea.  Genitourinary: Negative for dysuria, frequency, hematuria and urgency.  Musculoskeletal: Negative for back pain, joint pain, myalgias and neck pain.  Skin: Negative for rash.  Neurological: Negative for dizziness, tingling, sensory change, focal weakness and headaches.  Endo/Heme/Allergies: Negative for environmental allergies and polydipsia. Does not bruise/bleed easily.  Psychiatric/Behavioral: Negative for depression and suicidal ideas. The patient is not nervous/anxious and does not have insomnia.      Objective  Vitals:   04/04/17 1103  BP: 120/84  Pulse: 80  Weight: 85 lb (38.6 kg)  Height: 5\' 2"  (1.575 m)    Physical Exam  Constitutional: She is well-developed, well-nourished, and in no distress. No distress.  HENT:  Head: Normocephalic and atraumatic.  Right Ear: External ear normal.  Left Ear: External ear normal.  Nose: Nose normal.  Mouth/Throat: Oropharynx is clear and moist.  Eyes: Conjunctivae and EOM are normal. Pupils are equal, round, and reactive to light. Right eye exhibits no discharge. Left eye exhibits no discharge.  Neck: Normal range of motion. Neck supple. No JVD present. No thyromegaly present.  Cardiovascular: Normal rate, regular rhythm, normal heart  sounds and intact distal pulses.  Exam reveals no gallop and no friction rub.   No murmur heard. Pulmonary/Chest: Effort normal. She has decreased breath sounds. She has no wheezes. She has no rhonchi. She has no rales. She exhibits no tenderness.  Abdominal: Soft. Bowel sounds are normal. She exhibits no mass. There is no tenderness. There is no guarding.  Musculoskeletal: Normal range of motion. She exhibits no edema.  Lymphadenopathy:    She has no cervical adenopathy.  Neurological: She is alert. She has normal reflexes.  Skin: Skin is warm and dry. She is not diaphoretic. There is erythema.  Psychiatric: Mood and affect normal.  Nursing note and vitals reviewed.     Assessment & Plan  Problem List Items Addressed This Visit      Respiratory   Centrilobular emphysema ([place]) - Primary   Relevant Medications   Ipratropium-Albuterol (COMBIVENT RESPIMAT) 20-100 MCG/ACT AERS respimat   albuterol (PROVENTIL HFA;VENTOLIN HFA) 108 (90 Base) MCG/ACT inhaler   Other Relevant Orders   DG Chest 2 View    Other Visit Diagnoses    Bronchitis       Relevant Orders  DG Chest 2 View   Insect bite, initial encounter       Relevant Orders   Tdap vaccine greater than or equal to 7yo IM (Completed)   Need for diphtheria-tetanus-pertussis (Tdap) vaccine       Relevant Orders   Tdap vaccine greater than or equal to 7yo IM (Completed)   Cigarette nicotine dependence without complication       Relevant Medications   buPROPion (WELLBUTRIN) 75 MG tablet      Meds ordered this encounter  Medications  . Ipratropium-Albuterol (COMBIVENT RESPIMAT) 20-100 MCG/ACT AERS respimat    Sig: Inhale 1 puff into the lungs every 6 (six) hours.    Dispense:  1 Inhaler    Refill:  6  . albuterol (PROVENTIL HFA;VENTOLIN HFA) 108 (90 Base) MCG/ACT inhaler    Sig: Inhale 2 puffs into the lungs every 6 (six) hours as needed for wheezing or shortness of breath.    Dispense:  1 Inhaler    Refill:  6    sched  appt for Sept  . buPROPion (WELLBUTRIN) 75 MG tablet    Sig: Take 1 tablet (75 mg total) by mouth 2 (two) times daily.    Dispense:  60 tablet    Refill:  6      Dr. Otilio Miu [facility]  04/04/17

## 2017-04-08 ENCOUNTER — Ambulatory Visit (INDEPENDENT_AMBULATORY_CARE_PROVIDER_SITE_OTHER): Payer: PPO | Admitting: Family Medicine

## 2017-04-08 ENCOUNTER — Ambulatory Visit
Admission: RE | Admit: 2017-04-08 | Discharge: 2017-04-08 | Disposition: A | Discharge status: Home / Self Care | Payer: PPO | Source: Ambulatory Visit | Attending: Family Medicine | Admitting: Family Medicine

## 2017-04-08 ENCOUNTER — Encounter: Payer: Self-pay | Admitting: Family Medicine

## 2017-04-08 VITALS — BP 120/70 | HR 100 | Ht 62.0 in | Wt 85.0 lb

## 2017-04-08 DIAGNOSIS — G5621 Lesion of ulnar nerve, right upper limb: Secondary | ICD-10-CM | POA: Insufficient documentation

## 2017-04-08 DIAGNOSIS — M5032 Other cervical disc degeneration, mid-cervical region, unspecified level: Secondary | ICD-10-CM | POA: Diagnosis not present

## 2017-04-08 DIAGNOSIS — M5030 Other cervical disc degeneration, unspecified cervical region: Secondary | ICD-10-CM | POA: Diagnosis not present

## 2017-04-08 DIAGNOSIS — M129 Arthropathy, unspecified: Secondary | ICD-10-CM | POA: Insufficient documentation

## 2017-04-08 NOTE — Progress Notes (Signed)
Name: Gabriella Wiggins   MRN: [id number]    DOB: December 20, 1940   Date:04/08/2017       Progress Note  Subjective  Chief Complaint  Chief Complaint  Patient presents with  . arm numbness    R) arm x 2 days- forearm/ can feel it when she pinches on one side but not as good as on the other    Neurologic Problem  The patient's primary symptoms include focal sensory loss. The patient's pertinent negatives include no altered mental status, clumsiness, focal weakness, loss of balance, memory loss, near-syncope, slurred speech, syncope, visual change or weakness. This is a new problem. The current episode started yesterday. The neurological problem developed suddenly. The problem is unchanged. There was right-sided and upper extremity (ulna nerve) focality noted. Pertinent negatives include no abdominal pain, auditory change, aura, back pain, bladder incontinence, bowel incontinence, chest pain, confusion, diaphoresis, dizziness, fatigue, fever, headaches, light-headedness, nausea, neck pain, palpitations, shortness of breath, vertigo or vomiting. Past treatments include nothing. The treatment provided moderate relief. There is no history of a CVA, dementia, head trauma or mood changes.    No problem-specific Assessment & Plan notes found for this encounter.   Past Medical History:  Diagnosis Date  . Breast cancer ([place])    left lumpectomy  . Cancer [facility]) 2003   breast  . COPD (chronic obstructive pulmonary disease) ([place])     Past Surgical History:  Procedure Laterality Date  . BREAST BIOPSY Right    milk duct removed -neg  . BREAST BIOPSY Left 2004   lumpectomy/rad  . OVARIAN CYST SURGERY      Family History  Problem Relation Age of Onset  . Heart disease Father   . Cancer Sister   . Breast cancer Sister        2 sisters  . Diabetes Brother   . Breast cancer Maternal Aunt   . Breast cancer Paternal Aunt   . Breast cancer Cousin     Social History   Social History  . Marital status:  Married    Spouse name: N/A  . Number of children: N/A  . Years of education: N/A   Occupational History  . Not on file.   Social History Main Topics  . Smoking status: Current Every Day Smoker  . Smokeless tobacco: Never Used     Comment: chantix didn't help and patches- she is allergic to the adhesive  . Alcohol use No  . Drug use: No  . Sexual activity: Yes   Other Topics Concern  . Not on file   Social History Narrative  . No narrative on file    Allergies  Allergen Reactions  . Penicillins     Outpatient Medications Prior to Visit  Medication Sig Dispense Refill  . albuterol (PROVENTIL HFA;VENTOLIN HFA) 108 (90 Base) MCG/ACT inhaler Inhale 2 puffs into the lungs every 6 (six) hours as needed for wheezing or shortness of breath. 1 Inhaler 6  . buPROPion (WELLBUTRIN) 75 MG tablet Take 1 tablet (75 mg total) by mouth 2 (two) times daily. 60 tablet 6  . Ipratropium-Albuterol (COMBIVENT RESPIMAT) 20-100 MCG/ACT AERS respimat Inhale 1 puff into the lungs every 6 (six) hours. 1 Inhaler 6  . Fluticasone-Salmeterol (ADVAIR DISKUS) 250-50 MCG/DOSE AEPB Inhale 1 puff into the lungs 2 (two) times daily. (Patient not taking: Reported on 04/04/2017) 1 each 6   No facility-administered medications prior to visit.     Review of Systems  Constitutional: Negative for chills, diaphoresis, fatigue, fever,  malaise/fatigue and weight loss.  HENT: Negative for ear discharge, ear pain and sore throat.   Eyes: Negative for blurred vision.  Respiratory: Negative for cough, sputum production, shortness of breath and wheezing.   Cardiovascular: Negative for chest pain, palpitations, leg swelling and near-syncope.  Gastrointestinal: Negative for abdominal pain, blood in stool, bowel incontinence, constipation, diarrhea, heartburn, melena, nausea and vomiting.  Genitourinary: Negative for bladder incontinence, dysuria, frequency, hematuria and urgency.  Musculoskeletal: Negative for back pain,  joint pain, myalgias and neck pain.  Skin: Negative for rash.  Neurological: Negative for dizziness, vertigo, tingling, sensory change, focal weakness, syncope, weakness, light-headedness, headaches and loss of balance.  Endo/Heme/Allergies: Negative for environmental allergies and polydipsia. Does not bruise/bleed easily.  Psychiatric/Behavioral: Negative for confusion, depression, memory loss and suicidal ideas. The patient is not nervous/anxious and does not have insomnia.      Objective  Vitals:   04/08/17 1336  BP: 120/70  Pulse: 100  Weight: 85 lb (38.6 kg)  Height: 5\' 2"  (1.575 m)    Physical Exam  Constitutional: She is well-developed, well-nourished, and in no distress. No distress.  HENT:  Head: Normocephalic and atraumatic.  Right Ear: External ear normal.  Left Ear: External ear normal.  Nose: Nose normal.  Mouth/Throat: Oropharynx is clear and moist.  Eyes: Pupils are equal, round, and reactive to light. Conjunctivae and EOM are normal. Right eye exhibits no discharge. Left eye exhibits no discharge.  Neck: Normal range of motion. Neck supple. No JVD present. No thyromegaly present.  Cardiovascular: Normal rate, regular rhythm, normal heart sounds and intact distal pulses.  Exam reveals no gallop and no friction rub.   No murmur heard. Pulmonary/Chest: Effort normal and breath sounds normal. She has no wheezes. She has no rales.  Abdominal: Soft. Bowel sounds are normal. She exhibits no mass. There is no tenderness. There is no guarding.  Musculoskeletal: Normal range of motion. She exhibits no edema.  Lymphadenopathy:    She has no cervical adenopathy.  Neurological: She is alert. She has normal strength, normal reflexes and intact cranial nerves. A sensory deficit is present.  Decrease right ulna distribution  Skin: Skin is warm and dry. She is not diaphoretic.  Psychiatric: Mood and affect normal.  Nursing note and vitals reviewed.     Assessment &  Plan  Problem List Items Addressed This Visit    None    Visit Diagnoses    Ulnar neuropathy of right upper extremity    -  Primary   pyridoxine 50mg  bid   Relevant Orders   DG Cervical Spine Complete      No orders of the defined types were placed in this encounter.     Dr. [facility]  04/08/17

## 2017-04-12 ENCOUNTER — Other Ambulatory Visit: Payer: Self-pay

## 2017-05-02 ENCOUNTER — Other Ambulatory Visit: Payer: Self-pay

## 2017-05-05 ENCOUNTER — Ambulatory Visit
Admission: RE | Admit: 2017-05-05 | Discharge: 2017-05-05 | Disposition: A | Discharge status: Home / Self Care | Payer: PPO | Source: Ambulatory Visit | Attending: Family Medicine | Admitting: Family Medicine

## 2017-05-05 DIAGNOSIS — Z1231 Encounter for screening mammogram for malignant neoplasm of breast: Secondary | ICD-10-CM

## 2017-05-05 HISTORY — DX: Personal history of irradiation: Z92.3

## 2017-05-11 ENCOUNTER — Ambulatory Visit (INDEPENDENT_AMBULATORY_CARE_PROVIDER_SITE_OTHER): Payer: PPO | Admitting: Family Medicine

## 2017-05-11 ENCOUNTER — Encounter: Payer: Self-pay | Admitting: Family Medicine

## 2017-05-11 VITALS — BP 130/64 | HR 86 | Ht 62.0 in | Wt 83.0 lb

## 2017-05-11 DIAGNOSIS — R634 Abnormal weight loss: Secondary | ICD-10-CM | POA: Diagnosis not present

## 2017-05-11 DIAGNOSIS — N3000 Acute cystitis without hematuria: Secondary | ICD-10-CM

## 2017-05-11 DIAGNOSIS — G5621 Lesion of ulnar nerve, right upper limb: Secondary | ICD-10-CM

## 2017-05-11 DIAGNOSIS — R195 Other fecal abnormalities: Secondary | ICD-10-CM | POA: Diagnosis not present

## 2017-05-11 DIAGNOSIS — Z1211 Encounter for screening for malignant neoplasm of colon: Secondary | ICD-10-CM | POA: Diagnosis not present

## 2017-05-11 DIAGNOSIS — J432 Centrilobular emphysema: Secondary | ICD-10-CM | POA: Diagnosis not present

## 2017-05-11 DIAGNOSIS — N950 Postmenopausal bleeding: Secondary | ICD-10-CM

## 2017-05-11 DIAGNOSIS — N3090 Cystitis, unspecified without hematuria: Secondary | ICD-10-CM

## 2017-05-11 DIAGNOSIS — M5090 Cervical disc disorder, unspecified, unspecified cervical region: Secondary | ICD-10-CM | POA: Diagnosis not present

## 2017-05-11 LAB — POCT URINALYSIS DIPSTICK
Bilirubin, UA: NEGATIVE
Glucose, UA: NEGATIVE
KETONES UA: NEGATIVE
Nitrite, UA: NEGATIVE
PH UA: 5 (ref 5.0–8.0)
PROTEIN UA: NEGATIVE
Spec Grav, UA: 1.01 (ref 1.010–1.025)
Urobilinogen, UA: 0.2 E.U./dL

## 2017-05-11 LAB — HEMOCCULT GUIAC POC 1CARD (OFFICE): Fecal Occult Blood, POC: POSITIVE — AB

## 2017-05-11 MED ORDER — SULFAMETHOXAZOLE-TRIMETHOPRIM 800-160 MG PO TABS: 1.0000 | ORAL_TABLET | Freq: Two times a day (BID) | ORAL | 0 refills | Status: DC

## 2017-05-11 NOTE — Addendum Note (Signed)
Addended by: Otilio Miu C on: 05/11/2017 11:11 AM   Modules accepted: Orders

## 2017-05-11 NOTE — Progress Notes (Signed)
Name: Gabriella Wiggins   MRN: [id number]    DOB: [birth year]   Date:05/11/2017       Progress Note  Subjective  Chief Complaint  Chief Complaint  Patient presents with  . Follow-up    numbness in R) forearm-"seems to be getting a little better, I think"    Neurologic Problem  The patient's pertinent negatives include no altered mental status, clumsiness, focal sensory loss, focal weakness, loss of balance, memory loss, near-syncope, slurred speech, syncope, visual change or weakness. Primary symptoms comment: resolution of paresthesia. The neurological problem developed gradually. The problem has been gradually improving since onset. Pertinent negatives include no abdominal pain, auditory change, aura, back pain, bladder incontinence, chest pain, confusion, diaphoresis, dizziness, fatigue, fever, headaches, light-headedness, nausea, neck pain, palpitations, shortness of breath or vertigo. There is no history of a bleeding disorder, a clotting disorder, a CVA, dementia, head trauma, mood changes or seizures. (Partial oophorectomy)  Vaginal Bleeding  The patient's primary symptoms include vaginal bleeding. The patient's pertinent negatives include no genital itching, genital lesions, genital odor, genital rash, missed menses, pelvic pain or vaginal discharge. Primary symptoms comment: resolution of paresthesia. This is a new problem. The current episode started in the past 7 days. The problem has been waxing and waning. The patient is experiencing no pain. Pertinent negatives include no abdominal pain, back pain, chills, constipation, diarrhea, dysuria, fever, frequency, headaches, hematuria, joint pain, nausea, rash, sore throat or urgency. Vaginal discharge characteristics: spotting. She has not been passing clots. She has not been passing tissue. Nothing aggravates the symptoms. She has tried nothing for the symptoms. The treatment provided no relief. She uses tubal ligation for contraception. Her past  medical history is significant for a gynecological surgery. There is no history of an abdominal surgery, endometriosis, menorrhagia, metrorrhagia or PID. (Partial oophorectomy)    No problem-specific Assessment & Plan notes found for this encounter.   Past Medical History:  Diagnosis Date  . Breast cancer ([place]) 2004   left lumpectomy  . Cancer [facility]) 2003   breast  . COPD (chronic obstructive pulmonary disease) ([place])   . Personal history of radiation therapy     Past Surgical History:  Procedure Laterality Date  . BREAST BIOPSY Right    milk duct removed -neg  . BREAST BIOPSY Left 2004   lumpectomy/rad  . BREAST LUMPECTOMY Left   . OVARIAN CYST SURGERY      Family History  Problem Relation Age of Onset  . Heart disease Father   . Cancer Sister   . Breast cancer Sister        2 sisters  . Diabetes Brother   . Breast cancer Maternal Aunt   . Breast cancer Paternal Aunt   . Breast cancer Cousin        cousins-both sides    Social History   Social History  . Marital status: Married    Spouse name: N/A  . Number of children: N/A  . Years of education: N/A   Occupational History  . Not on file.   Social History Main Topics  . Smoking status: Current Every Day Smoker  . Smokeless tobacco: Never Used     Comment: chantix didn't help and patches- she is allergic to the adhesive  . Alcohol use No  . Drug use: No  . Sexual activity: Yes   Other Topics Concern  . Not on file   Social History Narrative  . No narrative on file    Allergies  Allergen  Reactions  . Penicillins     Outpatient Medications Prior to Visit  Medication Sig Dispense Refill  . albuterol (PROVENTIL HFA;VENTOLIN HFA) 108 (90 Base) MCG/ACT inhaler Inhale 2 puffs into the lungs every 6 (six) hours as needed for wheezing or shortness of breath. 1 Inhaler 6  . buPROPion (WELLBUTRIN) 75 MG tablet Take 1 tablet (75 mg total) by mouth 2 (two) times daily. 60 tablet 6  . Ipratropium-Albuterol  (COMBIVENT RESPIMAT) 20-100 MCG/ACT AERS respimat Inhale 1 puff into the lungs every 6 (six) hours. 1 Inhaler 6   No facility-administered medications prior to visit.     Review of Systems  Constitutional: Positive for weight loss. Negative for chills, diaphoresis, fatigue, fever and malaise/fatigue.  HENT: Negative for ear discharge, ear pain and sore throat.   Eyes: Negative for blurred vision.  Respiratory: Negative for cough, sputum production, shortness of breath and wheezing.   Cardiovascular: Negative for chest pain, palpitations, leg swelling and near-syncope.  Gastrointestinal: Negative for abdominal pain, blood in stool, constipation, diarrhea, heartburn, melena and nausea.  Genitourinary: Positive for vaginal bleeding. Negative for bladder incontinence, dysuria, frequency, hematuria, menorrhagia, missed menses, pelvic pain, urgency and vaginal discharge.       Vag spotting x 7 days  Musculoskeletal: Negative for back pain, joint pain, myalgias and neck pain.  Skin: Negative for rash.  Neurological: Negative for dizziness, vertigo, tingling, sensory change, focal weakness, syncope, weakness, light-headedness, headaches and loss of balance.  Endo/Heme/Allergies: Negative for environmental allergies and polydipsia. Does not bruise/bleed easily.  Psychiatric/Behavioral: Negative for confusion, depression, memory loss and suicidal ideas. The patient is not nervous/anxious and does not have insomnia.      Objective  Vitals:   05/11/17 0928  BP: 130/64  Pulse: 86  Weight: 83 lb (37.6 kg)  Height: 5\' 2"  (1.575 m)    Physical Exam  Constitutional: She is well-developed, well-nourished, and in no distress. No distress.  HENT:  Head: Normocephalic and atraumatic.  Right Ear: External ear normal.  Left Ear: External ear normal.  Nose: Nose normal.  Mouth/Throat: Oropharynx is clear and moist.  Eyes: Pupils are equal, round, and reactive to light. Conjunctivae and EOM are  normal. Right eye exhibits no discharge. Left eye exhibits no discharge.  Neck: Normal range of motion. Neck supple. No JVD present. No thyromegaly present.  Cardiovascular: Normal rate, regular rhythm, normal heart sounds and intact distal pulses.  Exam reveals no gallop and no friction rub.   No murmur heard. Pulmonary/Chest: Effort normal and breath sounds normal. She has no wheezes. She has no rales.  Abdominal: Soft. Normal aorta and bowel sounds are normal. She exhibits mass. She exhibits no distension and no pulsatile liver. There is no hepatosplenomegaly. There is no tenderness. There is no guarding and no CVA tenderness.  Genitourinary: Rectal exam shows external hemorrhoid and guaiac positive stool. Rectal exam shows no mass.  Musculoskeletal: Normal range of motion. She exhibits no edema.  Lymphadenopathy:    She has no cervical adenopathy.  Neurological: She is alert. She has normal sensation, normal strength, normal reflexes and intact cranial nerves.  Skin: Skin is warm and dry. She is not diaphoretic.  Psychiatric: Mood and affect normal.  Nursing note and vitals reviewed.     Assessment & Plan  Problem List Items Addressed This Visit      Respiratory   Centrilobular emphysema ([place])    Other Visit Diagnoses    Ulnar neuropathy at elbow of right upper extremity    -  Primary   resolved   Weight loss       Relevant Orders   POCT occult blood stool (Completed)   CBC with Differential/Platelet   Renal Function Panel   Hepatic Function Panel (6)   US Abdomen Complete   US Transvaginal Non-OB   Postmenopausal bleeding       Relevant Orders   POCT occult blood stool (Completed)   POCT urinalysis dipstick (Completed)   CBC with Differential/Platelet   Ambulatory referral to Gynecology   US Abdomen Complete   US Transvaginal Non-OB   Encounter for Hemoccult screening       Relevant Orders   CBC with Differential/Platelet   Cervical disc disease       Cystitis        Occult blood in stools       Relevant Orders   US Abdomen Complete   US Transvaginal Non-OB      No orders of the defined types were placed in this encounter.     Dr. [facility]  05/11/17

## 2017-05-12 ENCOUNTER — Other Ambulatory Visit: Payer: Self-pay | Admitting: Family Medicine

## 2017-05-12 DIAGNOSIS — R195 Other fecal abnormalities: Secondary | ICD-10-CM

## 2017-05-12 DIAGNOSIS — N950 Postmenopausal bleeding: Secondary | ICD-10-CM

## 2017-05-12 DIAGNOSIS — R634 Abnormal weight loss: Secondary | ICD-10-CM

## 2017-05-12 LAB — RENAL FUNCTION PANEL
Albumin: 5 g/dL — ABNORMAL HIGH (ref 3.5–4.8)
BUN / CREAT RATIO: 21 (ref 12–28)
BUN: 17 mg/dL (ref 8–27)
CALCIUM: 10.2 mg/dL (ref 8.7–10.3)
CO2: 29 mmol/L (ref 20–29)
CREATININE: 0.8 mg/dL (ref 0.57–1.00)
Chloride: 95 mmol/L — ABNORMAL LOW (ref 96–106)
GFR calc Af Amer: 84 mL/min/{1.73_m2} (ref >59)
GFR calc non Af Amer: 73 mL/min/{1.73_m2} (ref >59)
GLUCOSE: 79 mg/dL (ref 65–99)
PHOSPHORUS: 3.2 mg/dL (ref 2.5–4.5)
POTASSIUM: 4.4 mmol/L (ref 3.5–5.2)
SODIUM: 140 mmol/L (ref 134–144)

## 2017-05-12 LAB — CBC WITH DIFFERENTIAL/PLATELET
BASOS ABS: 0.1 10*3/uL (ref 0.0–0.2)
BASOS: 1 %
EOS (ABSOLUTE): 0.1 10*3/uL (ref 0.0–0.4)
Eos: 2 %
Hematocrit: 46.9 % — ABNORMAL HIGH (ref 34.0–46.6)
Hemoglobin: 15.8 g/dL (ref 11.1–15.9)
IMMATURE GRANS (ABS): 0 10*3/uL (ref 0.0–0.1)
IMMATURE GRANULOCYTES: 0 %
LYMPHS: 25 %
Lymphocytes Absolute: 1.4 10*3/uL (ref 0.7–3.1)
MCH: 33.5 pg — ABNORMAL HIGH (ref 26.6–33.0)
MCHC: 33.7 g/dL (ref 31.5–35.7)
MCV: 99 fL — ABNORMAL HIGH (ref 79–97)
MONOS ABS: 0.4 10*3/uL (ref 0.1–0.9)
Monocytes: 8 %
NEUTROS PCT: 64 %
Neutrophils Absolute: 3.6 10*3/uL (ref 1.4–7.0)
PLATELETS: 276 10*3/uL (ref 150–379)
RBC: 4.72 x10E6/uL (ref 3.77–5.28)
RDW: 12.9 % (ref 12.3–15.4)
WBC: 5.7 10*3/uL (ref 3.4–10.8)

## 2017-05-12 LAB — HEPATIC FUNCTION PANEL (6)
ALT: 19 IU/L (ref 0–32)
AST: 30 IU/L (ref 0–40)
Alkaline Phosphatase: 139 IU/L — ABNORMAL HIGH (ref 39–117)
BILIRUBIN, DIRECT: 0.17 mg/dL (ref 0.00–0.40)
Bilirubin Total: 0.8 mg/dL (ref 0.0–1.2)

## 2017-05-13 ENCOUNTER — Ambulatory Visit
Admission: RE | Admit: 2017-05-13 | Discharge: 2017-05-13 | Disposition: A | Discharge status: Home / Self Care | Payer: PPO | Source: Ambulatory Visit | Attending: Family Medicine | Admitting: Family Medicine

## 2017-05-13 DIAGNOSIS — Z124 Encounter for screening for malignant neoplasm of cervix: Secondary | ICD-10-CM | POA: Diagnosis not present

## 2017-05-13 DIAGNOSIS — R195 Other fecal abnormalities: Secondary | ICD-10-CM | POA: Diagnosis present

## 2017-05-13 DIAGNOSIS — N950 Postmenopausal bleeding: Secondary | ICD-10-CM | POA: Diagnosis not present

## 2017-05-13 DIAGNOSIS — R634 Abnormal weight loss: Secondary | ICD-10-CM | POA: Diagnosis present

## 2017-05-13 DIAGNOSIS — N83201 Unspecified ovarian cyst, right side: Secondary | ICD-10-CM | POA: Diagnosis not present

## 2017-05-13 DIAGNOSIS — N858 Other specified noninflammatory disorders of uterus: Secondary | ICD-10-CM | POA: Diagnosis not present

## 2017-05-16 ENCOUNTER — Ambulatory Visit
Admission: RE | Admit: 2017-05-16 | Discharge: 2017-05-16 | Disposition: A | Discharge status: Home / Self Care | Payer: PPO | Source: Ambulatory Visit | Attending: Family Medicine | Admitting: Family Medicine

## 2017-05-16 ENCOUNTER — Other Ambulatory Visit: Payer: Self-pay

## 2017-05-16 DIAGNOSIS — N950 Postmenopausal bleeding: Secondary | ICD-10-CM | POA: Diagnosis not present

## 2017-05-16 DIAGNOSIS — R634 Abnormal weight loss: Secondary | ICD-10-CM

## 2017-05-16 DIAGNOSIS — R195 Other fecal abnormalities: Secondary | ICD-10-CM | POA: Insufficient documentation

## 2017-05-16 DIAGNOSIS — R938 Abnormal findings on diagnostic imaging of other specified body structures: Secondary | ICD-10-CM | POA: Diagnosis not present

## 2017-05-17 DIAGNOSIS — N950 Postmenopausal bleeding: Secondary | ICD-10-CM | POA: Diagnosis not present

## 2017-05-17 DIAGNOSIS — Z803 Family history of malignant neoplasm of breast: Secondary | ICD-10-CM | POA: Diagnosis not present

## 2017-05-23 ENCOUNTER — Ambulatory Visit: Payer: PPO

## 2017-05-25 ENCOUNTER — Ambulatory Visit: Payer: PPO | Admitting: Family Medicine

## 2017-05-31 ENCOUNTER — Other Ambulatory Visit: Payer: Self-pay | Admitting: Family Medicine

## 2017-05-31 DIAGNOSIS — J432 Centrilobular emphysema: Secondary | ICD-10-CM

## 2017-06-08 DIAGNOSIS — Z1379 Encounter for other screening for genetic and chromosomal anomalies: Secondary | ICD-10-CM | POA: Insufficient documentation

## 2017-06-08 HISTORY — DX: Encounter for other screening for genetic and chromosomal anomalies: Z13.79

## 2017-06-08 NOTE — Progress Notes (Signed)
[place]  [phone] [phone]) [phone]  ID: Lynelle Doctor OB: 1941/05/11  MR#: [id number]  ZDG#:[id number]  Patient Care Team: Juline Patch, MD as PCP - General (Family Medicine)  CHIEF COMPLAINT: Genetic counseling and testing.  INTERVAL HISTORY: Patient is a 76 year old female with a personal history of breast cancer at the age of 71. She also has 2 sisters both diagnosed with breast cancer as well as a maternal aunt and maternal cousin with breast cancer. Finally, she reports one of her sisters was tested positive for mutation, but she does not know which one. She does not report any malignancy in her mother. Currently, she is anxious but otherwise feels well. No neurologic complaints. She denies any recent fevers or illnesses. She has a good appetite and denies weight loss. She has no chest pain or shortness of breath. She denies any nausea, vomiting, constipation, or diarrhea. She has no urinary complaints. Patient feels at her baseline and offers no further specific complaints today.  REVIEW OF SYSTEMS:   Review of Systems  Constitutional: Negative.  Negative for fever, malaise/fatigue and weight loss.  Respiratory: Negative.  Negative for cough and shortness of breath.   Cardiovascular: Negative.  Negative for chest pain and leg swelling.  Gastrointestinal: Negative.  Negative for abdominal pain.  Genitourinary: Negative.   Musculoskeletal: Negative.   Skin: Negative.  Negative for rash.  Neurological: Negative.  Negative for weakness.  Psychiatric/Behavioral: The patient is nervous/anxious.     As per HPI. Otherwise, a complete review of systems is negative.  PAST MEDICAL HISTORY: Past Medical History:  Diagnosis Date  . Breast cancer ([place]) 2004   left lumpectomy  . COPD (chronic obstructive pulmonary disease) ([place])   . Personal history of radiation therapy   . Post-menopause bleeding     PAST SURGICAL HISTORY: Past Surgical History:   Procedure Laterality Date  . APPENDECTOMY    . bilateral tubal    . BREAST BIOPSY Right    milk duct removed -neg  . BREAST BIOPSY Left 2004   lumpectomy/rad  . BREAST LUMPECTOMY Left   . OVARIAN CYST SURGERY      FAMILY HISTORY: Family History  Problem Relation Age of Onset  . Heart disease Father   . Breast cancer Sister   . Diabetes Brother   . Breast cancer Maternal Aunt   . Breast cancer Paternal Aunt   . Breast cancer Cousin        cousins-both sides  . Cirrhosis Mother   . Breast cancer Sister   . Diabetes Brother   . Parkinson's disease Brother     ADVANCED DIRECTIVES (Y/N):  N  HEALTH MAINTENANCE: Social History  Substance Use Topics  . Smoking status: Current Every Day Smoker    Packs/day: 0.50  . Smokeless tobacco: Never Used     Comment: chantix didn't help and patches- she is allergic to the adhesive  . Alcohol use No     Colonoscopy:  PAP:  Bone density:  Lipid panel:  Allergies  Allergen Reactions  . Penicillins Hives    Has patient had a PCN reaction causing immediate rash, facial/tongue/throat swelling, SOB or lightheadedness with hypotension: No Has patient had a PCN reaction causing severe rash involving mucus membranes or skin necrosis: No Has patient had a PCN reaction that required hospitalization: No Has patient had a PCN reaction occurring within the last 10 years: No If all of the above answers are "NO", then may proceed with Cephalosporin use.   Marland Kitchen  Shrimp [Shellfish Allergy] Swelling    Current Outpatient Prescriptions  Medication Sig Dispense Refill  . albuterol (PROVENTIL HFA;VENTOLIN HFA) 108 (90 Base) MCG/ACT inhaler Inhale 2 puffs into the lungs every 6 (six) hours as needed for wheezing or shortness of breath.    Marland Kitchen buPROPion (WELLBUTRIN) 75 MG tablet Take 1 tablet (75 mg total) by mouth 2 (two) times daily. 60 tablet 6  . carboxymethylcellulose (REFRESH PLUS) 0.5 % SOLN Place 1 drop into both eyes 2 (two) times daily as  needed (dry eyes).    . cholecalciferol (VITAMIN D) 1000 units tablet Take 1,000 Units by mouth daily.    . Ipratropium-Albuterol (COMBIVENT RESPIMAT) 20-100 MCG/ACT AERS respimat Inhale 1 puff into the lungs every 6 (six) hours. (Patient taking differently: Inhale 1 puff into the lungs 2 (two) times daily as needed for wheezing or shortness of breath. ) 1 Inhaler 6  . pyridOXINE (VITAMIN B-6) 100 MG tablet Take 100 mg by mouth daily.     No current facility-administered medications for this visit.     OBJECTIVE: Vitals:   06/10/17 1112  BP: (!) 159/85  Pulse: 94  Resp: 18  Temp: (!) 97.4 F (36.3 C)     Body mass index is 15.65 kg/m.    ECOG FS:0 - Asymptomatic  General: Well-developed, well-nourished, no acute distress. Eyes: Pink conjunctiva, anicteric sclera. Musculoskeletal: No edema, cyanosis, or clubbing. Neuro: Alert, answering all questions appropriately. Cranial nerves grossly intact. Skin: No rashes or petechiae noted. Psych: Normal affect.  LAB RESULTS:  Lab Results  Component Value Date   NA 138 06/09/2017   K 3.9 06/09/2017   CL 99 (L) 06/09/2017   CO2 29 06/09/2017   GLUCOSE 76 06/09/2017   BUN 19 06/09/2017   CREATININE 0.58 06/09/2017   CALCIUM 9.7 06/09/2017   ALBUMIN 5.0 (H) 05/11/2017   AST 30 05/11/2017   ALT 19 05/11/2017   ALKPHOS 139 (H) 05/11/2017   BILITOT 0.8 05/11/2017   GFRNONAA >60 06/09/2017   GFRAA >60 06/09/2017    Lab Results  Component Value Date   WBC 4.6 06/09/2017   NEUTROABS 3.6 05/11/2017   HGB 15.3 06/09/2017   HCT 44.8 06/09/2017   MCV 100.0 06/09/2017   PLT 234 06/09/2017     STUDIES: US Abdomen Complete  Result Date: 05/16/2017 CLINICAL DATA:  Weight loss, occult blood in stools. History of breast malignancy. Postmenopausal bleeding. EXAM: ABDOMEN ULTRASOUND COMPLETE COMPARISON:  Abdominal ultrasound of July 06, 2005 FINDINGS: Gallbladder: No gallstones or wall thickening visualized. No sonographic Murphy sign  noted by sonographer. Common bile duct: Diameter: 1.6 mm Liver: No focal lesion identified. Within normal limits in parenchymal echogenicity. Portal vein is patent on color Doppler imaging with normal direction of blood flow towards the liver. IVC: No abnormality visualized. Pancreas: Visualized portion unremarkable. Spleen: Size and appearance within normal limits. Right Kidney: Length: 11.1 cm. The renal cortical echotexture is mildly increased and approximately equal to that of the adjacent liver. No mass or hydronephrosis is visualized. Left Kidney: Length: 10.9 cm. The renal cortical echotexture is mildly increased similar to that on the right. No mass or hydronephrosis is visualized. Abdominal aorta: There is no aneurysm.  Mural plaque is observed. Other findings: No ascites is demonstrated. IMPRESSION: No gallstones or sonographic evidence of acute cholecystitis. If there are clinical concerns of gallbladder dysfunction, a nuclear medicine hepatobiliary scan with gallbladder ejection fraction determination may be useful. Normal appearance of the liver and spleen and visualized portions of the  pancreas. Mildly increased renal cortical echotexture suggests medical renal disease. Electronically Signed   By: David  Martinique M.D.   On: 05/16/2017 10:44   US Transvaginal Non-ob  Result Date: 05/13/2017 CLINICAL DATA:  Post menopausal bleeding for 2 weeks. Weight loss. Occult blood in stools. Patient reports endometrial biopsy 4 hours prior. EXAM: TRANSABDOMINAL AND TRANSVAGINAL ULTRASOUND OF PELVIS TECHNIQUE: Both transabdominal and transvaginal ultrasound examinations of the pelvis were performed. Transabdominal technique was performed for global imaging of the pelvis including uterus, ovaries, adnexal regions, and pelvic cul-de-sac. It was necessary to proceed with endovaginal exam following the transabdominal exam to visualize the uterus, endometrium, ovaries and adnexa. COMPARISON:  None FINDINGS: Uterus  Measurements: 4.9 x 2.5 x 3.7 cm. No fibroids or other mass visualized. Endometrium Thickness: 5 mm containing fluid in the endometrial canal. Endometrial thickness without fluid measures approximately 2 mm. No discrete endometrial polyp or mass. Right ovary Measurements: 2.0 x 1.3 x 0.9 cm. Simple cyst measures 1.3 x 1.1 x 1.1 cm. No internal septations or nodules. Blood flow is noted to the ovary. No adnexal mass. Left ovary Not visualized due to bowel gas.  No evidence of adnexal mass. Other findings No abnormal free fluid. IMPRESSION: 1. Fluid in the endometrial canal which is likely sequela of biopsy earlier this day. Endometrial thickness is normal excluding the intervening fluid. 2. Right ovarian cyst measuring 1.3 cm. This is almost certainly benign, but follow up ultrasound is recommended in 1 year according to the Society of Radiologists in [place] Statement (D Clovis Riley et al. Management of Asymptomatic Ovarian and Other Adnexal Cysts Imaged at [place]: Society of Radiologists in [place] Statement 2010. Radiology 256 (Sept 2010): 943-954.). 3. Left ovary not visualized.  No adnexal masses. Electronically Signed   By: Jeb Levering M.D.   On: 05/13/2017 16:51   US Pelvis Complete  Result Date: 05/13/2017 CLINICAL DATA:  Post menopausal bleeding for 2 weeks. Weight loss. Occult blood in stools. Patient reports endometrial biopsy 4 hours prior. EXAM: TRANSABDOMINAL AND TRANSVAGINAL ULTRASOUND OF PELVIS TECHNIQUE: Both transabdominal and transvaginal ultrasound examinations of the pelvis were performed. Transabdominal technique was performed for global imaging of the pelvis including uterus, ovaries, adnexal regions, and pelvic cul-de-sac. It was necessary to proceed with endovaginal exam following the transabdominal exam to visualize the uterus, endometrium, ovaries and adnexa. COMPARISON:  None FINDINGS: Uterus Measurements: 4.9 x 2.5 x 3.7 cm. No fibroids  or other mass visualized. Endometrium Thickness: 5 mm containing fluid in the endometrial canal. Endometrial thickness without fluid measures approximately 2 mm. No discrete endometrial polyp or mass. Right ovary Measurements: 2.0 x 1.3 x 0.9 cm. Simple cyst measures 1.3 x 1.1 x 1.1 cm. No internal septations or nodules. Blood flow is noted to the ovary. No adnexal mass. Left ovary Not visualized due to bowel gas.  No evidence of adnexal mass. Other findings No abnormal free fluid. IMPRESSION: 1. Fluid in the endometrial canal which is likely sequela of biopsy earlier this day. Endometrial thickness is normal excluding the intervening fluid. 2. Right ovarian cyst measuring 1.3 cm. This is almost certainly benign, but follow up ultrasound is recommended in 1 year according to the Society of Radiologists in [place] Statement (D Clovis Riley et al. Management of Asymptomatic Ovarian and Other Adnexal Cysts Imaged at [place]: Society of Radiologists in [place] Statement 2010. Radiology 256 (Sept 2010): 943-954.). 3. Left ovary not visualized.  No adnexal masses. Electronically Signed   By: Threasa Beards  Ehinger M.D.   On: 05/13/2017 16:51    ASSESSMENT: Genetic counseling and testing  PLAN:    1. Genetic counseling and testing: Given patient's personal and family history of breast cancer, she would definitely qualify for genetic testing today. She also reports her sister tested positive for a mutation, but she is unclear which one. She is attempting to obtain these results and will forward them to the clinic in the near future. If genetic testing is negative, no further follow-up is necessary and patient was educated on preventative measures she can take which includes continuing her yearly mammograms. If positive, patient will return to clinic to discuss the results. She has 1 son and 1 daughter that would also need to be tested if she returns positive.  Approximately 60  minutes was spent in discussion of which greater than 50% was consultation.  Patient expressed understanding and was in agreement with this plan. She also understands that She can call clinic at any time with any questions, concerns, or complaints.    Lloyd Huger, MD   06/10/2017 4:26 PM

## 2017-06-08 NOTE — H&P (Addendum)
Ms. Ports is a 76 y.o. female here for fractional d+c and hysteroscopy , possible myosure . H/O PMB   Pt with endometrial biopsy  Neg  Recent  u/sshows 2 small endometrial polyps . Size 4.6mm + 4.8 mm   Past Medical History:  has a past medical history of Breast cancer , unspecified (CMS-HCC); Breast cancer , unspecified (CMS-HCC); Breast cancer in situ; and COPD (chronic obstructive pulmonary disease) , unspecified (CMS-HCC).  Past Surgical History:  has a past surgical history that includes Breast surgery (Left); ovarian cyst surgery; abdominal ovarian cystectomies; and Tubal ligation. Family History: family history includes Breast cancer in her maternal aunt, maternal aunt, sister, and sister; Diabetes in her brother; Heart disease in her father. Social History:  reports that she has been smoking.  She has been smoking about 0.25 packs per day. She has never used smokeless tobacco. She reports that she does not drink alcohol. OB/GYN History:          OB History    Gravida Para Term Preterm AB Living   2 2       2    SAB TAB Ectopic Molar Multiple Live Births             2      Allergies: is allergic to penicillin. Medications:  Current Outpatient Prescriptions:  .  albuterol sulfate 90 mcg/actuation AePB, Inhale into the lungs., Disp: , Rfl:  .  buPROPion (WELLBUTRIN) 75 MG tablet, Take 75 mg by mouth 2 (two) times daily., Disp: , Rfl:   Review of Systems: General:                      No fatigue or weight loss Eyes:                           No vision changes Ears:                            No hearing difficulty Respiratory:                No cough or shortness of breath Pulmonary:                  No asthma or shortness of breath Cardiovascular:           No chest pain, palpitations, dyspnea on exertion Gastrointestinal:          No abdominal bloating, chronic diarrhea, constipations, masses, pain or hematochezia Genitourinary:             No hematuria, dysuria,  abnormal vaginal discharge, pelvic pain, Menometrorrhagia Lymphatic:                   No swollen lymph nodes Musculoskeletal:         No muscle weakness Neurologic:                  No extremity weakness, syncope, seizure disorder Psychiatric:                  No history of depression, delusions or suicidal/homicidal ideation    Exam:      Vitals:   06/09/17  1535  BP: 125/63  Pulse: 106    Body mass index is 15 kg/m.  WDWN white/  female in NAD   Lungs: CTA  CV : RRR without murmur    Pvic :  vag - no blood  Cx : no lesions  UTX : NSSC  Adnexa no mass   Impression:   The primary encounter diagnosis was PMB (postmenopausal bleeding). A diagnosis of Family history of breast cancer was also pertinent to this visit.    Plan:   Fractional D+C  And hysteroscopic evaluation and possible myosure removal .

## 2017-06-09 ENCOUNTER — Ambulatory Visit
Admission: RE | Admit: 2017-06-09 | Discharge: 2017-06-09 | Disposition: A | Discharge status: Home / Self Care | Payer: PPO | Source: Ambulatory Visit | Attending: Obstetrics and Gynecology | Admitting: Obstetrics and Gynecology

## 2017-06-09 ENCOUNTER — Other Ambulatory Visit: Payer: Self-pay | Admitting: Family Medicine

## 2017-06-09 DIAGNOSIS — Z01812 Encounter for preprocedural laboratory examination: Secondary | ICD-10-CM | POA: Diagnosis not present

## 2017-06-09 DIAGNOSIS — J449 Chronic obstructive pulmonary disease, unspecified: Secondary | ICD-10-CM | POA: Diagnosis not present

## 2017-06-09 DIAGNOSIS — R9431 Abnormal electrocardiogram [ECG] [EKG]: Secondary | ICD-10-CM | POA: Insufficient documentation

## 2017-06-09 DIAGNOSIS — Z01810 Encounter for preprocedural cardiovascular examination: Secondary | ICD-10-CM | POA: Diagnosis not present

## 2017-06-09 HISTORY — DX: Postmenopausal bleeding: N95.0

## 2017-06-09 LAB — CBC
HCT: 44.8 % (ref 35.0–47.0)
Hemoglobin: 15.3 g/dL (ref 12.0–16.0)
MCH: 34.1 pg — ABNORMAL HIGH (ref 26.0–34.0)
MCHC: 34.1 g/dL (ref 32.0–36.0)
MCV: 100 fL (ref 80.0–100.0)
PLATELETS: 234 10*3/uL (ref 150–440)
RBC: 4.48 MIL/uL (ref 3.80–5.20)
RDW: 13.1 % (ref 11.5–14.5)
WBC: 4.6 10*3/uL (ref 3.6–11.0)

## 2017-06-09 LAB — BASIC METABOLIC PANEL
Anion gap: 10 (ref 5–15)
BUN: 19 mg/dL (ref 6–20)
CO2: 29 mmol/L (ref 22–32)
CREATININE: 0.58 mg/dL (ref 0.44–1.00)
Calcium: 9.7 mg/dL (ref 8.9–10.3)
Chloride: 99 mmol/L — ABNORMAL LOW (ref 101–111)
GFR calc Af Amer: >60 mL/min (ref >60)
Glucose, Bld: 76 mg/dL (ref 65–99)
Potassium: 3.9 mmol/L (ref 3.5–5.1)
SODIUM: 138 mmol/L (ref 135–145)

## 2017-06-09 LAB — TYPE AND SCREEN
ABO/RH(D): B NEG
ANTIBODY SCREEN: NEGATIVE

## 2017-06-09 NOTE — Pre-Procedure Instructions (Signed)
Instructed in incentive spirometry.

## 2017-06-09 NOTE — Patient Instructions (Signed)
Your procedure is scheduled on: 06/20/17 Mon Report to Same Day Surgery 2nd floor medical mall [facility] Entrance-take elevator on left to 2nd floor.  Check in with surgery information desk.) To find out your arrival time please call [phone] between 1PM - 3PM on 06/17/17 Fri  Remember: Instructions that are not followed completely may result in serious medical risk, up to and including death, or upon the discretion of your surgeon and anesthesiologist your surgery may need to be rescheduled.    _x___ 1. Do not eat food after midnight the night before your procedure. You may drink clear liquids up to 2 hours before you are scheduled to arrive at the hospital for your procedure.  Do not drink clear liquids within 2 hours of your scheduled arrival to the hospital.  Clear liquids include  --Water or Apple juice without pulp  --Clear carbohydrate beverage such as ClearFast or Gatorade  --Black Coffee or Clear Tea (No milk, no creamers, do not add anything to                  the coffee or Tea Type 1 and type 2 diabetics should only drink water.  No gum chewing or hard candies.     __x__ 2. No Alcohol for 24 hours before or after surgery.   __x__3. No Smoking for 24 prior to surgery.   ____  4. Bring all medications with you on the day of surgery if instructed.    __x__ 5. Notify your doctor if there is any change in your medical condition     (cold, fever, infections).     Do not wear jewelry, make-up, hairpins, clips or nail polish.  Do not wear lotions, powders, or perfumes. You may wear deodorant.  Do not shave 48 hours prior to surgery. Men may shave face and neck.  Do not bring valuables to the hospital.    [facility] is not responsible for any belongings or valuables.               Contacts, dentures or bridgework may not be worn into surgery.  Leave your suitcase in the car. After surgery it may be brought to your room.  For patients admitted to the hospital, discharge  time is determined by your                       treatment team.   Patients discharged the day of surgery will not be allowed to drive home.  You will need someone to drive you home and stay with you the night of your procedure.    Please read over the following fact sheets that you were given:   [facility] Preparing for Surgery and or MRSA Information   _x___ Take anti-hypertensive listed below, cardiac, seizure, asthma,     anti-reflux and psychiatric medicines. These include:  1. albuterol (PROVENTIL   2.Ipratropium-Albuterol (COMBIVENT RESPIMAT  3.  4.  5.  6.  ____Fleets enema or Magnesium Citrate as directed.   _x___ Use CHG Soap or sage wipes as directed on instruction sheet   ____ Use inhalers on the day of surgery and bring to hospital day of surgery  ____ Stop Metformin and Janumet 2 days prior to surgery.    ____ Take 1/2 of usual insulin dose the night before surgery and none on the morning     surgery.   _x___ Follow recommendations from Cardiologist, Pulmonologist or PCP regarding  stopping Aspirin, Coumadin, Plavix ,Eliquis, Effient, or Pradaxa, and Pletal.  X____Stop Anti-inflammatories such as Advil, Aleve, Ibuprofen, Motrin, Naproxen, Naprosyn, Goodies powders or aspirin products. OK to take Tylenol and                          Celebrex.   _x___ Stop supplements until after surgery.  But may continue Vitamin D, Vitamin B,       and multivitamin.   ____ Bring C-Pap to the hospital.

## 2017-06-09 NOTE — Pre-Procedure Instructions (Signed)
EKG/REQUEST FOR CLEARANCE BY DR Modena Jansky AND FAXED TO DR Kendal Hymen. SPOKE WITH DAISY ALSO FAXED TO DR Ouida Sills. SPOKE WITH ANGIE

## 2017-06-10 ENCOUNTER — Inpatient Hospital Stay: Payer: PPO | Attending: Oncology | Admitting: Oncology

## 2017-06-10 ENCOUNTER — Encounter: Payer: Self-pay | Admitting: Oncology

## 2017-06-10 DIAGNOSIS — Z853 Personal history of malignant neoplasm of breast: Secondary | ICD-10-CM | POA: Diagnosis not present

## 2017-06-10 DIAGNOSIS — R634 Abnormal weight loss: Secondary | ICD-10-CM | POA: Insufficient documentation

## 2017-06-10 DIAGNOSIS — F17210 Nicotine dependence, cigarettes, uncomplicated: Secondary | ICD-10-CM | POA: Diagnosis not present

## 2017-06-10 DIAGNOSIS — Z803 Family history of malignant neoplasm of breast: Secondary | ICD-10-CM | POA: Diagnosis not present

## 2017-06-10 DIAGNOSIS — Z1379 Encounter for other screening for genetic and chromosomal anomalies: Secondary | ICD-10-CM | POA: Diagnosis not present

## 2017-06-10 DIAGNOSIS — Z923 Personal history of irradiation: Secondary | ICD-10-CM | POA: Diagnosis not present

## 2017-06-10 DIAGNOSIS — N83201 Unspecified ovarian cyst, right side: Secondary | ICD-10-CM | POA: Insufficient documentation

## 2017-06-10 DIAGNOSIS — Z79899 Other long term (current) drug therapy: Secondary | ICD-10-CM | POA: Diagnosis not present

## 2017-06-10 DIAGNOSIS — N950 Postmenopausal bleeding: Secondary | ICD-10-CM

## 2017-06-10 DIAGNOSIS — F419 Anxiety disorder, unspecified: Secondary | ICD-10-CM | POA: Insufficient documentation

## 2017-06-10 DIAGNOSIS — J449 Chronic obstructive pulmonary disease, unspecified: Secondary | ICD-10-CM | POA: Diagnosis not present

## 2017-06-17 ENCOUNTER — Other Ambulatory Visit: Payer: Self-pay

## 2017-06-17 NOTE — Pre-Procedure Instructions (Signed)
Refaxed clearance request  

## 2017-06-17 NOTE — Pre-Procedure Instructions (Signed)
Called Dr Schermerhorn's office regarding attempts to obtain medical clearance from Dr Keturah Barre. Ronnald Ramp.  "Let me see what I can do." per office staff.

## 2017-06-17 NOTE — Pre-Procedure Instructions (Signed)
Called Dr Ronnald Ramp office regarding clearance, left message with nurse line.

## 2017-06-19 MED ORDER — CEFOXITIN SODIUM-DEXTROSE 2-2.2 GM-% IV SOLR (PREMIX)
2.0000 g | INTRAVENOUS | Status: AC
  Administered 2017-06-20: 2 g via INTRAVENOUS

## 2017-06-20 ENCOUNTER — Ambulatory Visit: Payer: PPO | Admitting: Certified Registered"

## 2017-06-20 ENCOUNTER — Ambulatory Visit
Admission: RE | Admit: 2017-06-20 | Discharge: 2017-06-20 | Disposition: A | Discharge status: Home / Self Care | Payer: PPO | Source: Ambulatory Visit | Attending: Obstetrics and Gynecology | Admitting: Obstetrics and Gynecology

## 2017-06-20 ENCOUNTER — Encounter: Payer: Self-pay | Admitting: Anesthesiology

## 2017-06-20 ENCOUNTER — Encounter
Admission: RE | Disposition: A | Discharge status: Home / Self Care | Payer: Self-pay | Source: Ambulatory Visit | Attending: Obstetrics and Gynecology

## 2017-06-20 DIAGNOSIS — N840 Polyp of corpus uteri: Secondary | ICD-10-CM | POA: Insufficient documentation

## 2017-06-20 DIAGNOSIS — Z8249 Family history of ischemic heart disease and other diseases of the circulatory system: Secondary | ICD-10-CM | POA: Insufficient documentation

## 2017-06-20 DIAGNOSIS — F17200 Nicotine dependence, unspecified, uncomplicated: Secondary | ICD-10-CM | POA: Insufficient documentation

## 2017-06-20 DIAGNOSIS — N72 Inflammatory disease of cervix uteri: Secondary | ICD-10-CM | POA: Diagnosis not present

## 2017-06-20 DIAGNOSIS — Z853 Personal history of malignant neoplasm of breast: Secondary | ICD-10-CM | POA: Diagnosis not present

## 2017-06-20 DIAGNOSIS — J449 Chronic obstructive pulmonary disease, unspecified: Secondary | ICD-10-CM | POA: Diagnosis not present

## 2017-06-20 DIAGNOSIS — N841 Polyp of cervix uteri: Secondary | ICD-10-CM | POA: Insufficient documentation

## 2017-06-20 DIAGNOSIS — Z833 Family history of diabetes mellitus: Secondary | ICD-10-CM | POA: Insufficient documentation

## 2017-06-20 DIAGNOSIS — N950 Postmenopausal bleeding: Secondary | ICD-10-CM | POA: Insufficient documentation

## 2017-06-20 DIAGNOSIS — Z803 Family history of malignant neoplasm of breast: Secondary | ICD-10-CM | POA: Diagnosis not present

## 2017-06-20 DIAGNOSIS — Z880 Allergy status to penicillin: Secondary | ICD-10-CM | POA: Diagnosis not present

## 2017-06-20 DIAGNOSIS — Z923 Personal history of irradiation: Secondary | ICD-10-CM | POA: Diagnosis not present

## 2017-06-20 DIAGNOSIS — D250 Submucous leiomyoma of uterus: Secondary | ICD-10-CM | POA: Diagnosis not present

## 2017-06-20 HISTORY — PX: DILATATION & CURETTAGE/HYSTEROSCOPY WITH MYOSURE: SHX6511

## 2017-06-20 HISTORY — PX: HYSTEROSCOPY WITH D & C: SHX1775

## 2017-06-20 LAB — ABO/RH: ABO/RH(D): B NEG

## 2017-06-20 SURGERY — DILATATION AND CURETTAGE /HYSTEROSCOPY
Anesthesia: General

## 2017-06-20 MED ORDER — OXYCODONE HCL 5 MG/5ML PO SOLN: 5.0000 mg | Freq: Once | ORAL | Status: DC | PRN

## 2017-06-20 MED ORDER — OXYCODONE HCL 5 MG PO TABS: 5.0000 mg | ORAL_TABLET | Freq: Once | ORAL | Status: DC | PRN

## 2017-06-20 MED ORDER — PROPOFOL 10 MG/ML IV BOLUS
INTRAVENOUS | Status: DC | PRN
  Administered 2017-06-20: 100 mg via INTRAVENOUS

## 2017-06-20 MED ORDER — ONDANSETRON HCL 4 MG/2ML IJ SOLN
INTRAMUSCULAR | Status: AC
  Filled 2017-06-20: qty 2

## 2017-06-20 MED ORDER — CEFOXITIN SODIUM-DEXTROSE 2-2.2 GM-% IV SOLR (PREMIX)
INTRAVENOUS | Status: AC
  Filled 2017-06-20: qty 50

## 2017-06-20 MED ORDER — LACTATED RINGERS IV SOLN: INTRAVENOUS | Status: DC

## 2017-06-20 MED ORDER — FAMOTIDINE 20 MG PO TABS
ORAL_TABLET | ORAL | Status: AC
  Administered 2017-06-20: 20 mg via ORAL
  Filled 2017-06-20: qty 1

## 2017-06-20 MED ORDER — DEXAMETHASONE SODIUM PHOSPHATE 10 MG/ML IJ SOLN
INTRAMUSCULAR | Status: AC
  Filled 2017-06-20: qty 1

## 2017-06-20 MED ORDER — GLYCOPYRROLATE 0.2 MG/ML IJ SOLN
INTRAMUSCULAR | Status: DC | PRN
  Administered 2017-06-20: 0.2 mg via INTRAVENOUS

## 2017-06-20 MED ORDER — FAMOTIDINE 20 MG PO TABS
20.0000 mg | ORAL_TABLET | Freq: Once | ORAL | Status: AC
  Administered 2017-06-20: 20 mg via ORAL

## 2017-06-20 MED ORDER — GLYCOPYRROLATE 0.2 MG/ML IJ SOLN
INTRAMUSCULAR | Status: AC
  Filled 2017-06-20: qty 1

## 2017-06-20 MED ORDER — ONDANSETRON HCL 4 MG/2ML IJ SOLN
INTRAMUSCULAR | Status: DC | PRN
  Administered 2017-06-20: 4 mg via INTRAVENOUS

## 2017-06-20 MED ORDER — PROMETHAZINE HCL 25 MG/ML IJ SOLN: 6.2500 mg | INTRAMUSCULAR | Status: DC | PRN

## 2017-06-20 MED ORDER — LACTATED RINGERS IV SOLN
INTRAVENOUS | Status: DC
  Administered 2017-06-20: 13:00:00 via INTRAVENOUS

## 2017-06-20 MED ORDER — FENTANYL CITRATE (PF) 100 MCG/2ML IJ SOLN
INTRAMUSCULAR | Status: DC | PRN
  Administered 2017-06-20 (×2): 50 ug via INTRAVENOUS

## 2017-06-20 MED ORDER — FENTANYL CITRATE (PF) 100 MCG/2ML IJ SOLN
INTRAMUSCULAR | Status: AC
  Filled 2017-06-20: qty 2

## 2017-06-20 MED ORDER — DEXAMETHASONE SODIUM PHOSPHATE 10 MG/ML IJ SOLN
INTRAMUSCULAR | Status: DC | PRN
  Administered 2017-06-20: 10 mg via INTRAVENOUS

## 2017-06-20 MED ORDER — FENTANYL CITRATE (PF) 100 MCG/2ML IJ SOLN: 25.0000 ug | INTRAMUSCULAR | Status: DC | PRN

## 2017-06-20 SURGICAL SUPPLY — 21 items
ABLATOR ENDOMETRIAL MYOSURE (ABLATOR) IMPLANT
CANISTER SUC SOCK COL 7IN (MISCELLANEOUS) ×3 IMPLANT
CANISTER SUCT 3000ML PPV (MISCELLANEOUS) ×3 IMPLANT
CATH ROBINSON RED A/P 16FR (CATHETERS) ×3 IMPLANT
DEVICE MYOSURE LITE (MISCELLANEOUS) ×3 IMPLANT
GLOVE BIO SURGEON STRL SZ8 (GLOVE) ×3 IMPLANT
GOWN STRL REUS W/ TWL LRG LVL3 (GOWN DISPOSABLE) ×1 IMPLANT
GOWN STRL REUS W/ TWL XL LVL3 (GOWN DISPOSABLE) ×1 IMPLANT
GOWN STRL REUS W/TWL LRG LVL3 (GOWN DISPOSABLE) ×2
GOWN STRL REUS W/TWL XL LVL3 (GOWN DISPOSABLE) ×2
IV LACTATED RINGERS 1000ML (IV SOLUTION) ×3 IMPLANT
KIT RM TURNOVER CYSTO AR (KITS) ×3 IMPLANT
PACK DNC HYST (MISCELLANEOUS) ×3 IMPLANT
PAD OB MATERNITY 4.3X12.25 (PERSONAL CARE ITEMS) ×3 IMPLANT
PAD PREP 24X41 OB/GYN DISP (PERSONAL CARE ITEMS) ×3 IMPLANT
SOL .9 NS 3000ML IRR  AL (IV SOLUTION)
SOL .9 NS 3000ML IRR UROMATIC (IV SOLUTION) IMPLANT
TOWEL OR 17X26 4PK STRL BLUE (TOWEL DISPOSABLE) ×3 IMPLANT
TUBING CONNECTING 10 (TUBING) ×2 IMPLANT
TUBING CONNECTING 10' (TUBING) ×1
TUBING HYSTEROSCOPY DOLPHIN (MISCELLANEOUS) IMPLANT

## 2017-06-20 NOTE — Discharge Instructions (Addendum)
AMBULATORY SURGERY  °DISCHARGE INSTRUCTIONS ° ° °1) The drugs that you were given will stay in your system until tomorrow so for the next 24 hours you should not: ° °A) Drive an automobile °B) Make any legal decisions °C) Drink any alcoholic beverage ° ° °2) You may resume regular meals tomorrow.  Today it is better to start with liquids and gradually work up to solid foods. ° °You may eat anything you prefer, but it is better to start with liquids, then soup and crackers, and gradually work up to solid foods. ° ° °3) Please notify your doctor immediately if you have any unusual bleeding, trouble breathing, redness and pain at the surgery site, drainage, fever, or pain not relieved by medication. ° ° ° °4) Additional Instructions: ° ° ° ° ° ° ° °Please contact your physician with any problems or Same Day Surgery at [phone], Monday through Friday 6 am to 4 pm, or [facility] Main number at [phone]. °Dilation and Curettage or Vacuum Curettage, Care After °These instructions give you information about caring for yourself after your procedure. Your doctor may also give you more specific instructions. Call your doctor if you have any problems or questions after your procedure. °Follow these instructions at home: °Activity °· Do not drive or use heavy machinery while taking prescription pain medicine. °· For 24 hours after your procedure, avoid driving. °· Take short walks often, followed by rest periods. Ask your doctor what activities are safe for you. After one or two days, you may be able to return to your normal activities. °· Do not lift anything that is heavier than 10 lb (4.5 kg) until your doctor approves. °· For at least 2 weeks, or as long as told by your doctor: °? Do not douche. °? Do not use tampons. °? Do not have sex. °General instructions °· Take over-the-counter and prescription medicines only as told by your doctor. This is very important if you take blood thinning medicine. °· Do  not take baths, swim, or use a hot tub until your doctor approves. Take showers instead of baths. °· Wear compression stockings as told by your doctor. °· It is up to you to get the results of your procedure. Ask your doctor when your results will be ready. °· Keep all follow-up visits as told by your doctor. This is important. °Contact a doctor if: °· You have very bad cramps that get worse or do not get better with medicine. °· You have very bad pain in your belly (abdomen). °· You cannot drink fluids without throwing up (vomiting). °· You get pain in a different part of the area between your belly and thighs (pelvis). °· You have bad-smelling discharge from your vagina. °· You have a rash. °Get help right away if: °· You are bleeding a lot from your vagina. A lot of bleeding means soaking more than one sanitary pad in an hour, for 2 hours in a row. °· You have clumps of blood (blood clots) coming from your vagina. °· You have a fever or chills. °· Your belly feels very tender or hard. °· You have chest pain. °· You have trouble breathing. °· You cough up blood. °· You feel dizzy. °· You feel light-headed. °· You pass out (faint). °· You have pain in your neck or shoulder area. °Summary °· Take short walks often, followed by rest periods. Ask your doctor what activities are safe for you. After one or two days, you   may be able to return to your normal activities. °· Do not lift anything that is heavier than 10 lb (4.5 kg) until your doctor approves. °· Do not take baths, swim, or use a hot tub until your doctor approves. Take showers instead of baths. °· Contact your doctor if you have any symptoms of infection, like bad-smelling discharge from your vagina. °This information is not intended to replace advice given to you by your health care provider. Make sure you discuss any questions you have with your health care provider. °Document Released: 06/22/2008 Document Revised: 05/31/2016 Document Reviewed:  05/31/2016 °Elsevier Interactive Patient Education © 2017 [employer] ° °

## 2017-06-20 NOTE — Progress Notes (Signed)
Ready for surgery . All questions answered . LAbs nl NPO . Proceed

## 2017-06-20 NOTE — Anesthesia Preprocedure Evaluation (Addendum)
Anesthesia Evaluation  Patient identified by MRN, date of birth, ID band Patient awake    Reviewed: Allergy & Precautions, H&P , NPO status , Patient's Chart, lab work & pertinent test results  History of Anesthesia Complications (+) PONV and history of anesthetic complications  Airway Mallampati: III  TM Distance: <3 FB Neck ROM: limited    Dental  (+) Poor Dentition, Chipped, Missing, Caps   Pulmonary shortness of breath and with exertion, COPD,  COPD inhaler, Current Smoker,           Cardiovascular Exercise Tolerance: Good (-) angina(-) Past MI negative cardio ROS       Neuro/Psych negative neurological ROS  negative psych ROS   GI/Hepatic negative GI ROS, Neg liver ROS, neg GERD  ,  Endo/Other  negative endocrine ROS  Renal/GU      Musculoskeletal   Abdominal   Peds  Hematology negative hematology ROS (+)   Anesthesia Other Findings Past Medical History: 2004: Breast cancer ([place])     Comment:  left lumpectomy No date: COPD (chronic obstructive pulmonary disease) ([facility]) No date: Personal history of radiation therapy No date: Post-menopause bleeding  Past Surgical History: No date: APPENDECTOMY No date: bilateral tubal No date: BREAST BIOPSY; Right     Comment:  milk duct removed -neg 2004: BREAST BIOPSY; Left     Comment:  lumpectomy/rad No date: BREAST LUMPECTOMY; Left No date: OVARIAN CYST SURGERY  BMI    Body Mass Index:  15.55 kg/m      Reproductive/Obstetrics negative OB ROS                            Anesthesia Physical Anesthesia Plan  ASA: III  Anesthesia Plan: General LMA   Post-op Pain Management:    Induction: Intravenous  PONV Risk Score and Plan: 4 or greater and Ondansetron, Dexamethasone and Treatment may vary due to age or medical condition  Airway Management Planned: LMA  Additional Equipment:   Intra-op Plan:   Post-operative Plan:  Extubation in OR  Informed Consent: I have reviewed the patients History and Physical, chart, labs and discussed the procedure including the risks, benefits and alternatives for the proposed anesthesia with the patient or authorized representative who has indicated his/her understanding and acceptance.   Dental Advisory Given  Plan Discussed with: Anesthesiologist, CRNA and Surgeon  Anesthesia Plan Comments: (Patient consented for risks of anesthesia including but not limited to:  - adverse reactions to medications - damage to teeth, lips or other oral mucosa - sore throat or hoarseness - Damage to heart, brain, lungs or loss of life  Patient voiced understanding.)        Anesthesia Quick Evaluation

## 2017-06-20 NOTE — Anesthesia Procedure Notes (Signed)
Procedure Name: LMA Insertion Performed by: Aliviah Spain Pre-anesthesia Checklist: Patient identified, Patient being monitored, Timeout performed, Emergency Drugs available and Suction available Patient Re-evaluated:Patient Re-evaluated prior to induction Oxygen Delivery Method: Circle system utilized Preoxygenation: Pre-oxygenation with 100% oxygen Induction Type: IV induction Ventilation: Mask ventilation without difficulty LMA: LMA inserted LMA Size: 3.5 Tube type: Oral Number of attempts: 1 Placement Confirmation: positive ETCO2 and breath sounds checked- equal and bilateral Tube secured with: Tape Dental Injury: Teeth and Oropharynx as per pre-operative assessment        

## 2017-06-20 NOTE — Anesthesia Post-op Follow-up Note (Signed)
Anesthesia QCDR form completed.        

## 2017-06-20 NOTE — Transfer of Care (Signed)
Immediate Anesthesia Transfer of Care Note  Patient: Gabriella Wiggins  Procedure(s) Performed: Procedure(s): DILATATION AND CURETTAGE /HYSTEROSCOPY (N/A) DILATATION & CURETTAGE/HYSTEROSCOPY WITH MYOSURE (N/A)  Patient Location: PACU  Anesthesia Type:General  Level of Consciousness: sedated  Airway & Oxygen Therapy: Patient Spontanous Breathing and Patient connected to face mask oxygen  Post-op Assessment: Report given to RN and Post -op Vital signs reviewed and stable  Post vital signs: Reviewed  Last Vitals:  Vitals:   06/20/17 1645 06/20/17 1657  BP: (!) 99/57 (!) 99/57  Pulse: 88 88  Resp: 11 11  Temp: 36.4 C   SpO2: 100% 100%    Last Pain:  Vitals:   06/20/17 1310  TempSrc: Oral         Complications: No apparent anesthesia complications

## 2017-06-20 NOTE — Brief Op Note (Signed)
06/20/2017  4:47 PM  PATIENT:  Gabriella Wiggins Doctor  76 y.o. female  PRE-OPERATIVE DIAGNOSIS:  Post menopausal bleeding  + Endometrial Polyp  POST-OPERATIVE DIAGNOSIS:  Cervical polyp  , postmenopausal bleeding   Endometrial Polyp  PROCEDURE:  Procedure(s): DILATATION AND CURETTAGE /HYSTEROSCOPY (N/A) DILATATION & CURETTAGE/HYSTEROSCOPY WITH MYOSURE (N/A) Resection of endometrial polyp  SURGEON:  Surgeon(s) and Role:    * Wesam Gearhart, Gwen Her, MD - Primary  PHYSICIAN ASSISTANT: Kyra Leyland, pa student   ASSISTANTS: none   ANESTHESIA:   general  EBL:  Total I/O In: 500 [I.V.:500] Out: 80 [Urine:75; Blood:5]  BLOOD ADMINISTERED:none  DRAINS: none   LOCAL MEDICATIONS USED:  NONE  SPECIMEN:  Source of Specimen:  ecc, endometrial curettings , endometrial polyp , cervical polyp  DISPOSITION OF SPECIMEN:  PATHOLOGY  COUNTS:  YES  TOURNIQUET:  * No tourniquets in log *  DICTATION: .Other Dictation: Dictation Number verbal  PLAN OF CARE: Discharge to home after PACU  PATIENT DISPOSITION:  PACU - hemodynamically stable.   Delay start of Pharmacological VTE agent (>24hrs) due to surgical blood loss or risk of bleeding: not applicable

## 2017-06-20 NOTE — Anesthesia Postprocedure Evaluation (Signed)
Anesthesia Post Note  Patient: Gabriella Wiggins  Procedure(s) Performed: Procedure(s) (LRB): DILATATION AND CURETTAGE /HYSTEROSCOPY (N/A) DILATATION & CURETTAGE/HYSTEROSCOPY WITH MYOSURE (N/A)  Patient location during evaluation: PACU Anesthesia Type: General Level of consciousness: awake and alert Pain management: pain level controlled Vital Signs Assessment: post-procedure vital signs reviewed and stable Respiratory status: spontaneous breathing, nonlabored ventilation, respiratory function stable and patient connected to nasal cannula oxygen Cardiovascular status: blood pressure returned to baseline and stable Postop Assessment: no apparent nausea or vomiting Anesthetic complications: no     Last Vitals:  Vitals:   06/20/17 1704 06/20/17 1727  BP:  120/63  Pulse: 81 85  Resp: 10 18  Temp:  36.7 C  SpO2: 100% 94%    Last Pain:  Vitals:   06/20/17 1727  TempSrc:   PainSc: 0-No pain                 Precious Haws Shlomo Seres

## 2017-06-21 ENCOUNTER — Encounter: Payer: Self-pay | Admitting: Obstetrics and Gynecology

## 2017-06-21 NOTE — Op Note (Signed)
NAMESHARLISA, Gabriella Wiggins                 ACCOUNT NO.:  [id number]  MEDICAL RECORD NO.:  [id number]  LOCATION:                                 FACILITY:  PHYSICIAN:  Laverta Baltimore, MD     DATE OF BIRTH:  DATE OF PROCEDURE:  06/20/2017 DATE OF DISCHARGE:                              OPERATIVE REPORT   PREOPERATIVE DIAGNOSIS: 1. Postmenopausal bleeding. 2. Endometrial polyp.  POSTOPERATIVE DIAGNOSIS: 1. Postmenopausal bleeding. 2. Endometrial polyp. 3. Cervical nodule.  PROCEDURE PERFORMED: 1. Fractional dilation and curettage. 2. Cervical biopsy. 3. MyoSure resection of endometrial polyp.  SURGEON:  Laverta Baltimore, MD  ANESTHESIA:  General endotracheal anesthesia.  FIRST ASSISTANT:  Kyra Leyland, PA student.  INDICATION:  A 76 year old gravida 2, para 2, with postmenopausal bleeding.  The patient was known to have 2 separate endometrial polyps on saline infusion, sonohysterography measuring 4 x 4 mm each.  DESCRIPTION OF PROCEDURE:  After adequate general endotracheal anesthesia, the patient was placed in the dorsal supine position, legs in the Cheswick stirrups.  Abdomen, perineum, and vagina were prepped and draped in normal sterile fashion.  Time-out was performed.  The patient did receive 2 g IV cefoxitin prior to commencement of the case.  A weighted speculum was placed in the posterior vaginal vault.  The bladder was drained with a Red Robinson catheter yielding 75 mL clear urine.  Cervix was grasped with a single-tooth tenaculum and an endocervical curettage was performed.  Uterus sounded to 7 cm.  The uterus was then sounded to #16 Hanks dilator.  Hysteroscope was advanced into the endometrial cavity with lactated Ringer's as distending medium. Initial evaluation showed atrophic uterus.  There was redundant tissue obstructing the view of the right ostia of the fallopian tube, thought to be potential polyp.  MyoSure was brought up and this area was dissected.   Good hemostasis was noted.  Endometrial curettage was then performed with scant additional tissue.  There was noted to be a 7 x 5 mm cervical nodule on the anterior cervical lip, which was biopsied and silver nitrate was used as paste to control bleeding.  The procedure was terminated.  There were no complications.  ESTIMATED BLOOD LOSS:  Minimal.  INTRAOPERATIVE FLUIDS:  500 mL.  URINE OUTPUT:  75 mL.  The patient was taken to recovery room in good condition.    ______________________________ Laverta Baltimore, MD   ______________________________ Laverta Baltimore, MD    TS/MEDQ  D:  06/20/2017  T:  06/20/2017  Job:  [id number]

## 2017-06-22 LAB — SURGICAL PATHOLOGY

## 2017-06-27 ENCOUNTER — Ambulatory Visit (INDEPENDENT_AMBULATORY_CARE_PROVIDER_SITE_OTHER): Payer: PPO | Admitting: Gastroenterology

## 2017-06-27 ENCOUNTER — Encounter: Payer: Self-pay | Admitting: Gastroenterology

## 2017-06-27 ENCOUNTER — Other Ambulatory Visit: Payer: Self-pay | Admitting: Family Medicine

## 2017-06-27 VITALS — BP 132/65 | HR 88 | Temp 98.4°F | Ht 62.0 in | Wt 85.0 lb

## 2017-06-27 DIAGNOSIS — R195 Other fecal abnormalities: Secondary | ICD-10-CM

## 2017-06-27 DIAGNOSIS — N950 Postmenopausal bleeding: Secondary | ICD-10-CM

## 2017-06-27 DIAGNOSIS — R634 Abnormal weight loss: Secondary | ICD-10-CM

## 2017-06-27 DIAGNOSIS — J432 Centrilobular emphysema: Secondary | ICD-10-CM

## 2017-06-27 HISTORY — DX: Postmenopausal bleeding: N95.0

## 2017-06-27 NOTE — Progress Notes (Signed)
Gastroenterology Consultation  Referring Provider:     Juline Patch, MD Primary Care Physician:  Juline Patch, MD Primary Gastroenterologist:  Dr. Allen Norris     Reason for Consultation:     Heme positive stools and weight loss        HPI:   Gabriella Wiggins is a 76 y.o. y/o female referred for consultation & management of Heme positive stools and weight loss by Dr. Ronnald Ramp, Iven Finn, MD.  This patient comes in today with a report of blood in her stools and weight loss.  The patient has not seen the blood but had a Hemoccult-positive test as reported by the patient.  The patient also states that she was approximately 94 pounds last year and now she states she is 85 pounds.  The patient has never had a colonoscopy or upper endoscopy.  She states that when she lived in [place] she had friends who died as a result of findings during colonoscopy.  The patient recently went through a D&C and states that she is recovering from that at this time.  There is no report of any dysphagia or early satiety or gross passing of blood per rectum.  The patient states that her father had colon cancer but she thinks it was outside the colon in the appendix.  Past Medical History:  Diagnosis Date  . Breast cancer ([facility]) 2004   left lumpectomy  . COPD (chronic obstructive pulmonary disease) ([place])   . Personal history of radiation therapy   . Post-menopause bleeding     Past Surgical History:  Procedure Laterality Date  . APPENDECTOMY    . bilateral tubal    . BREAST BIOPSY Right    milk duct removed -neg  . BREAST BIOPSY Left 2004   lumpectomy/rad  . BREAST LUMPECTOMY Left   . DILATATION & CURETTAGE/HYSTEROSCOPY WITH MYOSURE N/A 06/20/2017   Procedure: DILATATION & CURETTAGE/HYSTEROSCOPY WITH MYOSURE;  Surgeon: Schermerhorn, Gwen Her, MD;  Location: [facility] ORS;  Service: Gynecology;  Laterality: N/A;  . HYSTEROSCOPY W/D&C N/A 06/20/2017   Procedure: DILATATION AND CURETTAGE /HYSTEROSCOPY;  Surgeon: Schermerhorn,  Gwen Her, MD;  Location: [facility] ORS;  Service: Gynecology;  Laterality: N/A;  . OVARIAN CYST SURGERY      Prior to Admission medications   Medication Sig Start Date End Date Taking? Authorizing Provider  albuterol (PROVENTIL HFA;VENTOLIN HFA) 108 (90 Base) MCG/ACT inhaler Inhale 2 puffs into the lungs every 6 (six) hours as needed for wheezing or shortness of breath.   Yes [provider]  albuterol (PROVENTIL HFA;VENTOLIN HFA) 108 (90 Base) MCG/ACT inhaler 1-2 puffs q 6 hr prn  06/27/17  Yes Juline Patch, MD  buPROPion (WELLBUTRIN) 75 MG tablet Take 1 tablet (75 mg total) by mouth 2 (two) times daily. 04/04/17  Yes Juline Patch, MD  carboxymethylcellulose (REFRESH PLUS) 0.5 % SOLN Place 1 drop into both eyes 2 (two) times daily as needed (dry eyes).   Yes [provider]  cholecalciferol (VITAMIN D) 1000 units tablet Take 1,000 Units by mouth daily.   Yes [provider]  Ipratropium-Albuterol (COMBIVENT RESPIMAT) 20-100 MCG/ACT AERS respimat Inhale 1 puff into the lungs every 6 (six) hours. Patient taking differently: Inhale 1 puff into the lungs 2 (two) times daily as needed for wheezing or shortness of breath.  04/04/17  Yes Juline Patch, MD  pyridOXINE (VITAMIN B-6) 100 MG tablet Take 100 mg by mouth daily.   Yes [provider]  Family History  Problem Relation Age of Onset  . Heart disease Father   . Breast cancer Sister   . Diabetes Brother   . Breast cancer Maternal Aunt   . Breast cancer Paternal Aunt   . Breast cancer Cousin        cousins-both sides  . Cirrhosis Mother   . Breast cancer Sister   . Diabetes Brother   . Parkinson's disease Brother      Social History  Substance Use Topics  . Smoking status: Current Every Day Smoker    Packs/day: 0.50  . Smokeless tobacco: Never Used     Comment: chantix didn't help and patches- she is allergic to the adhesive  . Alcohol use No    Allergies as of 06/27/2017 - Review Complete  06/27/2017  Allergen Reaction Noted  . Penicillins Hives 06/19/2015  . Shrimp [shellfish allergy] Swelling 06/07/2017    Review of Systems:    All systems reviewed and negative except where noted in HPI.   Physical Exam:  BP 132/65   Pulse 88   Temp 98.4 F (36.9 C) (Oral)   Ht 5\' 2"  (1.575 m)   Wt 85 lb (38.6 kg)   BMI 15.55 kg/m  No LMP recorded. Patient is postmenopausal. Psych:  Alert and cooperative. Normal mood and affect. General:   Alert,  Well-developed, Emaciated, pleasant and cooperative in NAD Head:  Normocephalic and atraumatic. Eyes:  Sclera clear, no icterus.   Conjunctiva pink. Ears:  Normal auditory acuity. Nose:  No deformity, discharge, or lesions. Mouth:  No deformity or lesions,oropharynx pink & moist. Neck:  Supple; no masses or thyromegaly. Lungs:  Respirations even and unlabored.  Clear throughout to auscultation.   No wheezes, crackles, or rhonchi. No acute distress. Heart:  Regular rate and rhythm; no murmurs, clicks, rubs, or gallops. Abdomen:  Normal bowel sounds.  No bruits.  Soft, non-tender and non-distended without masses, hepatosplenomegaly or hernias noted.  No guarding or rebound tenderness.  Negative Carnett sign.   Rectal:  Deferred.  Msk:  Symmetrical without gross deformities.  Good, equal movement & strength bilaterally. Pulses:  Normal pulses noted. Extremities:  No clubbing or edema.  No cyanosis. Neurologic:  Alert and oriented x3;  grossly normal neurologically. Skin:  Intact without significant lesions or rashes.  No jaundice. Lymph Nodes:  No significant cervical adenopathy. Psych:  Alert and cooperative. Normal mood and affect.  Imaging Studies: No results found.  Assessment and Plan:   [name] is a 76 y.o. y/o female who is sent to me for evaluation due to heme positive stools and weight loss.  The patient is apprehensive to undergo any procedures this time because of her recent D&C. The patient also has never had a  colonoscopy due to fear of having a colonoscopy.  I have explained to the patient about the concern for GI malignancy with her weight loss and heme positive stools.  I have recommended the patient undergo an EGD and colonoscopy.  The patient states that she does not want to do anything until she recovers and until she gets back from a trip to [place].  She states that she will contact our office in December to set this up if she decides to have the procedures done.  Again I have explained to the patient the risk of colon cancer and further weight loss.  The patient states she understands the consequences of not doing the procedure but insists on calling us back in December.  Nthony Lefferts  Allen Norris, MD. Marval Regal   Note: This dictation was prepared with Dragon dictation along with smaller phrase technology. Any transcriptional errors that result from this process are unintentional.

## 2017-07-06 ENCOUNTER — Telehealth: Payer: Self-pay | Admitting: Family Medicine

## 2017-07-06 NOTE — Telephone Encounter (Signed)
Called pt to sched for Annual Wellness Visit with Nurse Health Advisor. C/b #  [phone]  Kathryn Brown ° °

## 2017-07-20 ENCOUNTER — Ambulatory Visit: Payer: PPO | Admitting: Family Medicine

## 2017-07-21 ENCOUNTER — Ambulatory Visit (INDEPENDENT_AMBULATORY_CARE_PROVIDER_SITE_OTHER): Payer: PPO | Admitting: Family Medicine

## 2017-07-21 ENCOUNTER — Encounter: Payer: Self-pay | Admitting: Family Medicine

## 2017-07-21 VITALS — BP 110/70 | HR 80 | Ht 62.0 in | Wt 85.0 lb

## 2017-07-21 DIAGNOSIS — N3090 Cystitis, unspecified without hematuria: Secondary | ICD-10-CM

## 2017-07-21 DIAGNOSIS — Z23 Encounter for immunization: Secondary | ICD-10-CM | POA: Diagnosis not present

## 2017-07-21 DIAGNOSIS — Z1211 Encounter for screening for malignant neoplasm of colon: Secondary | ICD-10-CM | POA: Diagnosis not present

## 2017-07-21 DIAGNOSIS — J432 Centrilobular emphysema: Secondary | ICD-10-CM | POA: Diagnosis not present

## 2017-07-21 LAB — POCT URINALYSIS DIPSTICK
Bilirubin, UA: NEGATIVE
GLUCOSE UA: NEGATIVE
Ketones, UA: NEGATIVE
NITRITE UA: NEGATIVE
PROTEIN UA: NEGATIVE
Spec Grav, UA: <=1.005 — AB (ref 1.010–1.025)
UROBILINOGEN UA: 0.2 U/dL
pH, UA: 6 (ref 5.0–8.0)

## 2017-07-21 LAB — HEMOCCULT GUIAC POC 1CARD (OFFICE): FECAL OCCULT BLD: NEGATIVE

## 2017-07-21 MED ORDER — TIOTROPIUM BROMIDE-OLODATEROL 2.5-2.5 MCG/ACT IN AERS: 1.0000 | INHALATION_SPRAY | Freq: Two times a day (BID) | RESPIRATORY_TRACT | 11 refills | Status: DC

## 2017-07-21 MED ORDER — PROAIR HFA 108 (90 BASE) MCG/ACT IN AERS: 1.0000 | INHALATION_SPRAY | Freq: Four times a day (QID) | RESPIRATORY_TRACT | 11 refills | Status: DC | PRN

## 2017-07-21 MED ORDER — SULFAMETHOXAZOLE-TRIMETHOPRIM 800-160 MG PO TABS: 1.0000 | ORAL_TABLET | Freq: Two times a day (BID) | ORAL | 0 refills | Status: DC

## 2017-07-21 NOTE — Progress Notes (Signed)
Name: Gabriella Wiggins   MRN: [id number]    DOB: 09-25-1941   Date:07/21/2017       Progress Note  Subjective  Chief Complaint  Chief Complaint  Patient presents with  . COPD  . recheck urine    check to make sure blood is not showing up still    Breathing Problem  She complains of cough, difficulty breathing, shortness of breath and wheezing. There is no chest tightness, frequent throat clearing, hemoptysis, hoarse voice or sputum production. This is a chronic problem. The current episode started more than 1 year ago. The problem occurs daily. The problem has been waxing and waning. The cough is productive of sputum. Associated symptoms include dyspnea on exertion. Pertinent negatives include no appetite change, chest pain, ear congestion, ear pain, fever, headaches, heartburn, malaise/fatigue, myalgias, nasal congestion, orthopnea, sneezing, sore throat, sweats or weight loss. Her symptoms are alleviated by beta-agonist and ipratropium. She reports moderate improvement on treatment. Her symptoms are not alleviated by beta-agonist and ipratropium. Her past medical history is significant for COPD. There is no history of asthma, bronchiectasis, bronchitis, emphysema or pneumonia.    No problem-specific Assessment & Plan notes found for this encounter.   Past Medical History:  Diagnosis Date  . Breast cancer ([place]) 2004   left lumpectomy  . COPD (chronic obstructive pulmonary disease) ([place])   . Personal history of radiation therapy   . Post-menopause bleeding     Past Surgical History:  Procedure Laterality Date  . APPENDECTOMY    . bilateral tubal    . BREAST BIOPSY Right    milk duct removed -neg  . BREAST BIOPSY Left 2004   lumpectomy/rad  . BREAST LUMPECTOMY Left   . DILATATION & CURETTAGE/HYSTEROSCOPY WITH MYOSURE N/A 06/20/2017   Procedure: DILATATION & CURETTAGE/HYSTEROSCOPY WITH MYOSURE;  Surgeon: Schermerhorn, Gwen Her, MD;  Location: [facility] ORS;  Service: Gynecology;   Laterality: N/A;  . HYSTEROSCOPY W/D&C N/A 06/20/2017   Procedure: DILATATION AND CURETTAGE /HYSTEROSCOPY;  Surgeon: Schermerhorn, Gwen Her, MD;  Location: [facility] OR[facility];  Service: Gynecology;  Laterality: N/A;  . OVARIAN CYST SURGERY      Family History  Problem Relation Age of Onset  . Heart disease Father   . Breast cancer Sister   . Diabetes Brother   . Breast cancer Maternal Aunt   . Breast cancer Paternal Aunt   . Breast cancer Cousin        cousins-both sides  . Cirrhosis Mother   . Breast cancer Sister   . Diabetes Brother   . Parkinson's disease Brother     Social History   Social History  . Marital status: Married    Spouse name: N/A  . Number of children: N/A  . Years of education: N/A   Occupational History  . Not on file.   Social History Main Topics  . Smoking status: Current Every Day Smoker    Packs/day: 0.50  . Smokeless tobacco: Never Used     Comment: chantix didn't help and patches- she is allergic to the adhesive  . Alcohol use No  . Drug use: No  . Sexual activity: Yes   Other Topics Concern  . Not on file   Social History Narrative  . No narrative on file    Allergies  Allergen Reactions  . Penicillins Hives    Has patient had a PCN reaction causing immediate rash, facial/tongue/throat swelling, SOB or lightheadedness with hypotension: No Has patient had a PCN reaction causing severe rash involving  mucus membranes or skin necrosis: No Has patient had a PCN reaction that required hospitalization: No Has patient had a PCN reaction occurring within the last 10 years: No If all of the above answers are "NO", then may proceed with Cephalosporin use.   Marland Kitchen Shrimp [Shellfish Allergy] Swelling    Outpatient Medications Prior to Visit  Medication Sig Dispense Refill  . buPROPion (WELLBUTRIN) 75 MG tablet Take 1 tablet (75 mg total) by mouth 2 (two) times daily. 60 tablet 6  . carboxymethylcellulose (REFRESH PLUS) 0.5 % SOLN Place 1 drop into both  eyes 2 (two) times daily as needed (dry eyes).    . cholecalciferol (VITAMIN D) 1000 units tablet Take 1,000 Units by mouth daily.    Marland Kitchen pyridOXINE (VITAMIN B-6) 100 MG tablet Take 100 mg by mouth daily.    Marland Kitchen albuterol (PROVENTIL HFA;VENTOLIN HFA) 108 (90 Base) MCG/ACT inhaler Inhale 2 puffs into the lungs every 6 (six) hours as needed for wheezing or shortness of breath.    Marland Kitchen albuterol (PROVENTIL HFA;VENTOLIN HFA) 108 (90 Base) MCG/ACT inhaler 1-2 puffs q 6 hr prn  1 Inhaler 0  . Ipratropium-Albuterol (COMBIVENT RESPIMAT) 20-100 MCG/ACT AERS respimat Inhale 1 puff into the lungs every 6 (six) hours. (Patient taking differently: Inhale 1 puff into the lungs 2 (two) times daily as needed for wheezing or shortness of breath. ) 1 Inhaler 6   No facility-administered medications prior to visit.     Review of Systems  Constitutional: Negative for appetite change, chills, fever, malaise/fatigue and weight loss.  HENT: Negative for ear discharge, ear pain, hoarse voice, sneezing and sore throat.   Eyes: Negative for blurred vision.  Respiratory: Positive for cough, shortness of breath and wheezing. Negative for hemoptysis and sputum production.   Cardiovascular: Positive for dyspnea on exertion. Negative for chest pain, palpitations and leg swelling.  Gastrointestinal: Negative for abdominal pain, blood in stool, constipation, diarrhea, heartburn, melena and nausea.  Genitourinary: Negative for dysuria, frequency, hematuria and urgency.  Musculoskeletal: Negative for back pain, joint pain, myalgias and neck pain.  Skin: Negative for rash.  Neurological: Negative for dizziness, tingling, sensory change, focal weakness and headaches.  Endo/Heme/Allergies: Negative for environmental allergies and polydipsia. Does not bruise/bleed easily.  Psychiatric/Behavioral: Negative for depression and suicidal ideas. The patient is not nervous/anxious and does not have insomnia.      Objective  Vitals:    07/21/17 1110  BP: 110/70  Pulse: 80  Weight: 85 lb (38.6 kg)  Height: 5\' 2"  (1.575 m)    Physical Exam  Constitutional: She is well-developed, well-nourished, and in no distress. No distress.  HENT:  Head: Normocephalic and atraumatic.  Right Ear: External ear normal.  Left Ear: External ear normal.  Nose: Nose normal.  Mouth/Throat: Oropharynx is clear and moist.  Eyes: Pupils are equal, round, and reactive to light. Conjunctivae and EOM are normal. Right eye exhibits no discharge. Left eye exhibits no discharge.  Neck: Normal range of motion. Neck supple. No JVD present. No thyromegaly present.  Cardiovascular: Normal rate, regular rhythm, normal heart sounds and intact distal pulses.  Exam reveals no gallop and no friction rub.   No murmur heard. Pulmonary/Chest: Effort normal and breath sounds normal. She has no wheezes. She has no rales.  Abdominal: Soft. Bowel sounds are normal. She exhibits no mass. There is no tenderness. There is no guarding.  Genitourinary: Rectal exam shows external hemorrhoid. Rectal exam shows no mass, no tenderness and guaiac negative stool.  Musculoskeletal: Normal  range of motion. She exhibits no edema.  Lymphadenopathy:    She has no cervical adenopathy.  Neurological: She is alert. She has normal reflexes.  Skin: Skin is warm and dry. She is not diaphoretic.  Psychiatric: Mood and affect normal.  Nursing note and vitals reviewed.     Assessment & Plan  Problem List Items Addressed This Visit      Respiratory   Centrilobular emphysema ([place]) - Primary   Relevant Medications   Tiotropium Bromide-Olodaterol (STIOLTO RESPIMAT) 2.5-2.5 MCG/ACT AERS   PROAIR HFA 108 (90 Base) MCG/ACT inhaler    Other Visit Diagnoses    Cystitis       Relevant Medications   sulfamethoxazole-trimethoprim (BACTRIM DS,SEPTRA DS) 800-160 MG tablet   Other Relevant Orders   POCT urinalysis dipstick (Completed)   Need for vaccination against Streptococcus  pneumoniae using pneumococcal conjugate vaccine 13       Relevant Orders   Pneumococcal conjugate vaccine 13-valent IM (Completed)   Colon cancer screening       Relevant Orders   POCT occult blood stool (Completed)      Meds ordered this encounter  Medications  . sulfamethoxazole-trimethoprim (BACTRIM DS,SEPTRA DS) 800-160 MG tablet    Sig: Take 1 tablet by mouth 2 (two) times daily.    Dispense:  20 tablet    Refill:  0  . Tiotropium Bromide-Olodaterol (STIOLTO RESPIMAT) 2.5-2.5 MCG/ACT AERS    Sig: Inhale 1 Inhaler into the lungs 2 (two) times daily.    Dispense:  1 Inhaler    Refill:  11  . PROAIR HFA 108 (90 Base) MCG/ACT inhaler    Sig: Inhale 1-2 puffs into the lungs every 6 (six) hours as needed.    Dispense:  1 Inhaler    Refill:  11      Dr. Mac[facility]  07/21/17

## 2017-09-01 ENCOUNTER — Ambulatory Visit: Payer: PPO

## 2017-09-01 VITALS — Ht 62.0 in | Wt 87.8 lb

## 2017-09-01 DIAGNOSIS — Z0000 Encounter for general adult medical examination without abnormal findings: Secondary | ICD-10-CM

## 2017-09-01 NOTE — Patient Instructions (Signed)
Gabriella Wiggins , Thank you for taking time to come for your Medicare Wellness Visit. I appreciate your ongoing commitment to your health goals. Please review the following plan we discussed and let me know if I can assist you in the future.   Screening recommendations/referrals: Colonoscopy: You are due for colonoscopy. You recently saw Dr. Allen Norris for bleeding in you stool. He strongly recommended a colonoscopy. Please call his office to schedule this appointment. Mammogram: Completed 05/05/17. Repeat mammogram in one year. Bone Density: Completed 12/03/14. Osteoporotic screenings no longer required Recommended yearly ophthalmology/optometry visit for glaucoma screening and checkup Recommended yearly dental visit for hygiene and checkup  Vaccinations: Influenza vaccine: Declined. Does not intend to receive this vaccine now or any time in the near future. Pneumococcal vaccine: Completed PCV13 07/21/17 and PPSV 10/28/12 Tdap vaccine: Completed 04/04/17 Shingles vaccine: Declined. Please call your insurance company to determine your out of pocket expense.  Advanced directives: Please bring a copy of your POA (Power of Attorney) and/or Living Will to your next appointment.   Conditions/risks identified: Recommend to drink at least 6-8 8oz glasses of water per day  Next appointment: Please schedule a follow up appointment with Dr. Ronnald Ramp.  Please schedule your annual wellness exam with your Nurse Health Advisor in one year.  Preventive Care 13 Years and Older, Female Preventive care refers to lifestyle choices and visits with your health care provider that can promote health and wellness. What does preventive care include?  A yearly physical exam. This is also called an annual well check.  Dental exams once or twice a year.  Routine eye exams. Ask your health care provider how often you should have your eyes checked.  Personal lifestyle choices, including:  Daily care of your teeth and  gums.  Regular physical activity.  Eating a healthy diet.  Avoiding tobacco and drug use.  Limiting alcohol use.  Practicing safe sex.  Taking low-dose aspirin every day.  Taking vitamin and mineral supplements as recommended by your health care provider. What happens during an annual well check? The services and screenings done by your health care provider during your annual well check will depend on your age, overall health, lifestyle risk factors, and family history of disease. Counseling  Your health care provider may ask you questions about your:  Alcohol use.  Tobacco use.  Drug use.  Emotional well-being.  Home and relationship well-being.  Sexual activity.  Eating habits.  History of falls.  Memory and ability to understand (cognition).  Work and work Statistician.  Reproductive health. Screening  You may have the following tests or measurements:  Height, weight, and BMI.  Blood pressure.  Lipid and cholesterol levels. These may be checked every 5 years, or more frequently if you are over 52 years old.  Skin check.  Lung cancer screening. You may have this screening every year starting at age 11 if you have a 30-pack-year history of smoking and currently smoke or have quit within the past 15 years.  Fecal occult blood test (FOBT) of the stool. You may have this test every year starting at age 48.  Flexible sigmoidoscopy or colonoscopy. You may have a sigmoidoscopy every 5 years or a colonoscopy every 10 years starting at age 10.  Hepatitis C blood test.  Hepatitis B blood test.  Sexually transmitted disease (STD) testing.  Diabetes screening. This is done by checking your blood sugar (glucose) after you have not eaten for a while (fasting). You may have this done every  1-3 years.  Bone density scan. This is done to screen for osteoporosis. You may have this done starting at age 84.  Mammogram. This may be done every 1-2 years. Talk to your  health care provider about how often you should have regular mammograms. Talk with your health care provider about your test results, treatment options, and if necessary, the need for more tests. Vaccines  Your health care provider may recommend certain vaccines, such as:  Influenza vaccine. This is recommended every year.  Tetanus, diphtheria, and acellular pertussis (Tdap, Td) vaccine. You may need a Td booster every 10 years.  Zoster vaccine. You may need this after age 68.  Pneumococcal 13-valent conjugate (PCV13) vaccine. One dose is recommended after age 23.  Pneumococcal polysaccharide (PPSV23) vaccine. One dose is recommended after age 12. Talk to your health care provider about which screenings and vaccines you need and how often you need them. This information is not intended to replace advice given to you by your health care provider. Make sure you discuss any questions you have with your health care provider. Document Released: 10/10/2015 Document Revised: 06/02/2016 Document Reviewed: 07/15/2015 Elsevier Interactive Patient Education  2017 [place] Prevention in the Home Falls can cause injuries. They can happen to people of all ages. There are many things you can do to make your home safe and to help prevent falls. What can I do on the outside of my home?  Regularly fix the edges of walkways and driveways and fix any cracks.  Remove anything that might make you trip as you walk through a door, such as a raised step or threshold.  Trim any bushes or trees on the path to your home.  Use bright outdoor lighting.  Clear any walking paths of anything that might make someone trip, such as rocks or tools.  Regularly check to see if handrails are loose or broken. Make sure that both sides of any steps have handrails.  Any raised decks and porches should have guardrails on the edges.  Have any leaves, snow, or ice cleared regularly.  Use sand or salt on walking  paths during winter.  Clean up any spills in your garage right away. This includes oil or grease spills. What can I do in the bathroom?  Use night lights.  Install grab bars by the toilet and in the tub and shower. Do not use towel bars as grab bars.  Use non-skid mats or decals in the tub or shower.  If you need to sit down in the shower, use a plastic, non-slip stool.  Keep the floor dry. Clean up any water that spills on the floor as soon as it happens.  Remove soap buildup in the tub or shower regularly.  Attach bath mats securely with double-sided non-slip rug tape.  Do not have throw rugs and other things on the floor that can make you trip. What can I do in the bedroom?  Use night lights.  Make sure that you have a light by your bed that is easy to reach.  Do not use any sheets or blankets that are too big for your bed. They should not hang down onto the floor.  Have a firm chair that has side arms. You can use this for support while you get dressed.  Do not have throw rugs and other things on the floor that can make you trip. What can I do in the kitchen?  Clean up any spills right away.  Avoid walking on wet floors.  Keep items that you use a lot in easy-to-reach places.  If you need to reach something above you, use a strong step stool that has a grab bar.  Keep electrical cords out of the way.  Do not use floor polish or wax that makes floors slippery. If you must use wax, use non-skid floor wax.  Do not have throw rugs and other things on the floor that can make you trip. What can I do with my stairs?  Do not leave any items on the stairs.  Make sure that there are handrails on both sides of the stairs and use them. Fix handrails that are broken or loose. Make sure that handrails are as long as the stairways.  Check any carpeting to make sure that it is firmly attached to the stairs. Fix any carpet that is loose or worn.  Avoid having throw rugs at  the top or bottom of the stairs. If you do have throw rugs, attach them to the floor with carpet tape.  Make sure that you have a light switch at the top of the stairs and the bottom of the stairs. If you do not have them, ask someone to add them for you. What else can I do to help prevent falls?  Wear shoes that:  Do not have high heels.  Have rubber bottoms.  Are comfortable and fit you well.  Are closed at the toe. Do not wear sandals.  If you use a stepladder:  Make sure that it is fully opened. Do not climb a closed stepladder.  Make sure that both sides of the stepladder are locked into place.  Ask someone to hold it for you, if possible.  Clearly mark and make sure that you can see:  Any grab bars or handrails.  First and last steps.  Where the edge of each step is.  Use tools that help you move around (mobility aids) if they are needed. These include:  Canes.  Walkers.  Scooters.  Crutches.  Turn on the lights when you go into a dark area. Replace any light bulbs as soon as they burn out.  Set up your furniture so you have a clear path. Avoid moving your furniture around.  If any of your floors are uneven, fix them.  If there are any pets around you, be aware of where they are.  Review your medicines with your doctor. Some medicines can make you feel dizzy. This can increase your chance of falling. Ask your doctor what other things that you can do to help prevent falls. This information is not intended to replace advice given to you by your health care provider. Make sure you discuss any questions you have with your health care provider. Document Released: 07/10/2009 Document Revised: 02/19/2016 Document Reviewed: 10/18/2014 Elsevier Interactive Patient Education  2017 [employer].

## 2017-09-01 NOTE — Progress Notes (Signed)
Subjective:   Gabriella Wiggins is a 76 y.o. female who presents for Medicare Annual (Subsequent) preventive examination.  Review of Systems:  N/A Cardiac Risk Factors include: advanced age (>33men, >44 women);dyslipidemia;sedentary lifestyle;smoking/ tobacco exposure     Objective:     Vitals: Ht 5\' 2"  (1.575 m)   Wt 87 lb 12.8 oz (39.8 kg)   BMI 16.06 kg/m   Body mass index is 16.06 kg/m.  Advanced Directives 09/01/2017 06/20/2017 06/10/2017 06/09/2017 06/19/2015  Does Patient Have a Medical Advance Directive? Yes Yes Yes Yes Yes  Type of Paramedic of [place];Living will [place];Living will Living will;Healthcare Power of [place];Living will [place];Living will  Does patient want to make changes to medical advance directive? - No - Patient declined - - No - Patient declined  Copy of [place] in Chart? No - copy requested - - - -    Tobacco Social History   Tobacco Use  Smoking Status Current Every Day Smoker  . Packs/day: 0.25  . Years: 50.00  . Pack years: 12.50  . Types: Cigarettes  Smokeless Tobacco Never Used  Tobacco Comment   Wellbutrin to stop smoking     Ready to quit: Yes Counseling given: Yes Comment: Wellbutrin to stop smoking   Clinical Intake:  Pre-visit preparation completed: Yes  Pain : No/denies pain     BMI - recorded: 16.06 Nutritional Status: BMI <19  Underweight Nutritional Risks: None Diabetes: No  Activities of Daily Living: Independent Ambulation: Independent with device- listed below Home Assistive Devices/Equipment: Eyeglasses Medication Administration: Independent Home Management: Independent  Barriers to Care Management & Learning: None  Do you feel unsafe in your current relationship?: No(married) Do you feel physically threatened by others?: No Anyone hurting you at home, work, or school?: No  How often do you  need to have someone help you when you read instructions, pamphlets, or other written materials from your doctor or pharmacy?: 1 - Never What is the last grade level you completed in school?: 12th grade with some college  Interpreter Needed?: No  Information entered by :: Idell Pickles, LPN  Past Medical History:  Diagnosis Date  . Breast cancer ([place]) 2004   left lumpectomy  . COPD (chronic obstructive pulmonary disease) ([place])   . Personal history of radiation therapy   . Post-menopause bleeding    Past Surgical History:  Procedure Laterality Date  . APPENDECTOMY    . bilateral tubal    . BREAST BIOPSY Right    milk duct removed -neg  . BREAST BIOPSY Left 2004   lumpectomy/rad  . BREAST LUMPECTOMY Left   . DILATATION & CURETTAGE/HYSTEROSCOPY WITH MYOSURE N/A 06/20/2017   Procedure: DILATATION & CURETTAGE/HYSTEROSCOPY WITH MYOSURE;  Surgeon: Schermerhorn, Gwen Her, MD;  Location: [facility] ORS;  Service: Gynecology;  Laterality: N/A;  . HYSTEROSCOPY W/D&C N/A 06/20/2017   Procedure: DILATATION AND CURETTAGE /HYSTEROSCOPY;  Surgeon: Schermerhorn, Gwen Her, MD;  Location: [facility] ORS;  Service: Gynecology;  Laterality: N/A;  . OVARIAN CYST SURGERY     Family History  Problem Relation Age of Onset  . Heart disease Father   . Breast cancer Sister   . Diabetes Brother   . Breast cancer Maternal Aunt   . Breast cancer Paternal Aunt   . Breast cancer Cousin        cousins-both sides  . Cirrhosis Mother   . Bladder Cancer Sister   . Diabetes Brother   .  Parkinson's disease Brother   . Healthy Sister   . Heart disease Sister   . Dementia Sister   . Arthritis Sister   . Breast cancer Sister   . Healthy Brother   . Healthy Brother   . Arthritis Brother   . Colon cancer Cousin    Social History   Socioeconomic History  . Marital status: Married    Spouse name: None  . Number of children: 2  . Years of education: None  . Highest education level: None  Social Needs  . Financial  resource strain: Not hard at all  . Food insecurity - worry: Never true  . Food insecurity - inability: Never true  . Transportation needs - medical: No  . Transportation needs - non-medical: No  Occupational History  . Occupation: retired  Tobacco Use  . Smoking status: Current Every Day Smoker    Packs/day: 0.25    Years: 50.00    Pack years: 12.50    Types: Cigarettes  . Smokeless tobacco: Never Used  . Tobacco comment: Wellbutrin to stop smoking  Substance and Sexual Activity  . Alcohol use: No    Alcohol/week: 0.0 oz  . Drug use: No  . Sexual activity: No  Other Topics Concern  . None  Social History Narrative  . None    Outpatient Encounter Medications as of 09/01/2017  Medication Sig  . aspirin EC 81 MG tablet Take 81 mg by mouth daily.  Marland Kitchen buPROPion (WELLBUTRIN) 75 MG tablet Take 1 tablet (75 mg total) by mouth 2 (two) times daily.  . carboxymethylcellulose (REFRESH PLUS) 0.5 % SOLN Place 1 drop into both eyes 2 (two) times daily as needed (dry eyes).  . cholecalciferol (VITAMIN D) 1000 units tablet Take 1,000 Units by mouth daily.  Marland Kitchen PROAIR HFA 108 (90 Base) MCG/ACT inhaler Inhale 1-2 puffs into the lungs every 6 (six) hours as needed.  . pyridOXINE (VITAMIN B-6) 100 MG tablet Take 100 mg by mouth daily.  . Tiotropium Bromide-Olodaterol (STIOLTO RESPIMAT) 2.5-2.5 MCG/ACT AERS Inhale 1 Inhaler into the lungs 2 (two) times daily.  Marland Kitchen sulfamethoxazole-trimethoprim (BACTRIM DS,SEPTRA DS) 800-160 MG tablet Take 1 tablet by mouth 2 (two) times daily. (Patient not taking: Reported on 09/01/2017)   No facility-administered encounter medications on file as of 09/01/2017.     Activities of Daily Living In your present state of health, do you have any difficulty performing the following activities: 09/01/2017 06/09/2017  Hearing? N N  Vision? N N  Difficulty concentrating or making decisions? N N  Walking or climbing stairs? N N  Dressing or bathing? N N  Doing errands,  shopping? N N  Preparing Food and eating ? N -  Using the Toilet? N -  In the past six months, have you accidently leaked urine? N -  Do you have problems with loss of bowel control? N -  Managing your Medications? N -  Managing your Finances? N -  Housekeeping or managing your Housekeeping? N -  Some recent data might be hidden    Timed Get Up and Go performed: Yes. 10 sec. No intervention required  Patient Care Team: Juline Patch, MD as PCP - General (Family Medicine)    Assessment:   Exercise Activities and Dietary recommendations Current Exercise Habits: The patient does not participate in regular exercise at present, Exercise limited by: None identified  Goals    None     Fall Risk Fall Risk  09/01/2017 04/08/2017 04/08/2016 10/08/2015 06/19/2015  Falls in the past year? No No No No No   Is the patient's home free of loose throw rugs in walkways, pet beds, electrical cords, etc?   yes      Grab bars in the bathroom? No      Handrails on the stairs?   yes      Adequate lighting?   yes  Depression Screen PHQ 2/9 Scores 09/01/2017 07/21/2017 04/08/2017 04/08/2016  PHQ - 2 Score 0 0 0 0  PHQ- 9 Score 0 0 - -     Cognitive Function     6CIT Screen 09/01/2017  What Year? 0 points  What month? 0 points  What time? 0 points  Count back from 20 0 points  Months in reverse 0 points  Repeat phrase 0 points  Total Score 0    Immunization History  Administered Date(s) Administered  . Pneumococcal Conjugate-13 07/21/2017  . Pneumococcal Polysaccharide-23 10/28/2012  . Tdap 04/04/2017   Screening Tests Health Maintenance  Topic Date Due  . INFLUENZA VACCINE  07/21/2018 (Originally 04/27/2017)  . MAMMOGRAM  05/05/2018  . COLONOSCOPY  10/07/2025  . TETANUS/TDAP  04/05/2027  . DEXA SCAN  Completed  . PNA vac Low Risk Adult  Completed   Cancer Screenings: Breast: Up to date on Mammogram? Yes  Completed 05/05/17. Repeat mammogram in one year. Up to date of Bone  Density/Dexa? Yes  Completed 12/03/14. Osteoporotic screenings no longer required Colorectal: Due for colonoscopy. Pt had c/o heme + stools. Dr. Allen Norris strongly recommended colonoscopy but pt declined. Pt states she would like to proceed with her colonoscopy but not until after January 2019. States she will call Dr. Dorothey Baseman office to schedule an appointment.   Additional Screenings: HIV Screening: does not qualify Hepatitis C Screening: does not qualify     Plan:    I have personally reviewed and addressed the Medicare Annual Wellness questionnaire and have noted the following in the patient's chart:  A. Medical and social history B. Use of alcohol, tobacco or illicit drugs  C. Current medications and supplements D. Functional ability and status E.  Nutritional status F.  Physical activity G. Advance directives H. List of other physicians I.  Hospitalizations, surgeries, and ER visits in previous 12 months J.  [place] such as hearing and vision if needed, cognitive and depression L. Referrals and appointments - none  In addition, I have reviewed and discussed with patient certain preventive protocols, quality metrics, and best practice recommendations. A written personalized care plan for preventive services as well as general preventive health recommendations were provided to patient.  See attached scanned questionnaire for additional information.   Signed,  Aleatha Borer, LPN Nurse Health Advisor

## 2017-09-22 ENCOUNTER — Ambulatory Visit
Admission: RE | Admit: 2017-09-22 | Discharge: 2017-09-22 | Disposition: A | Discharge status: Home / Self Care | Payer: PPO | Source: Ambulatory Visit | Attending: Family Medicine | Admitting: Family Medicine

## 2017-09-22 ENCOUNTER — Ambulatory Visit: Payer: PPO | Admitting: Family Medicine

## 2017-09-22 ENCOUNTER — Encounter: Payer: Self-pay | Admitting: Family Medicine

## 2017-09-22 VITALS — BP 120/80 | HR 100 | Ht 62.0 in | Wt 84.0 lb

## 2017-09-22 DIAGNOSIS — M160 Bilateral primary osteoarthritis of hip: Secondary | ICD-10-CM | POA: Diagnosis not present

## 2017-09-22 DIAGNOSIS — M25551 Pain in right hip: Secondary | ICD-10-CM | POA: Diagnosis not present

## 2017-09-22 DIAGNOSIS — S299XXA Unspecified injury of thorax, initial encounter: Secondary | ICD-10-CM | POA: Diagnosis not present

## 2017-09-22 DIAGNOSIS — M545 Low back pain, unspecified: Secondary | ICD-10-CM

## 2017-09-22 DIAGNOSIS — S2241XA Multiple fractures of ribs, right side, initial encounter for closed fracture: Secondary | ICD-10-CM | POA: Insufficient documentation

## 2017-09-22 DIAGNOSIS — M549 Dorsalgia, unspecified: Secondary | ICD-10-CM | POA: Diagnosis not present

## 2017-09-22 DIAGNOSIS — M546 Pain in thoracic spine: Secondary | ICD-10-CM | POA: Diagnosis not present

## 2017-09-22 DIAGNOSIS — R0602 Shortness of breath: Secondary | ICD-10-CM | POA: Insufficient documentation

## 2017-09-22 DIAGNOSIS — J432 Centrilobular emphysema: Secondary | ICD-10-CM

## 2017-09-22 DIAGNOSIS — W19XXXA Unspecified fall, initial encounter: Secondary | ICD-10-CM

## 2017-09-22 DIAGNOSIS — R079 Chest pain, unspecified: Secondary | ICD-10-CM | POA: Diagnosis not present

## 2017-09-22 DIAGNOSIS — M419 Scoliosis, unspecified: Secondary | ICD-10-CM | POA: Insufficient documentation

## 2017-09-22 DIAGNOSIS — S79911A Unspecified injury of right hip, initial encounter: Secondary | ICD-10-CM | POA: Diagnosis not present

## 2017-09-22 NOTE — Progress Notes (Signed)
Name: Gabriella Wiggins   MRN: [id number]    DOB: 1940/10/28   Date:09/22/2017       Progress Note  Subjective  Chief Complaint  Chief Complaint  Patient presents with  . Fall    fell off stool 1 week ago- fell on back, hurting in mid back as well as R) hip and R) flank pain. Hurts to roll over in bed or to get up or stand up. Hasn't tried anything for pain    Fall  The accident occurred 5 to 7 days ago. The fall occurred from a stool. She fell from a height of 3 to 5 ft. She landed on hard floor. The point of impact was the right hip (back/ right chest wall). The pain is present in the back and right hip (right chest wall). The pain is at a severity of 5/10. The pain is moderate. The symptoms are aggravated by ambulation. Pertinent negatives include no abdominal pain, bowel incontinence, fever, headaches, hearing loss, hematuria, loss of consciousness, nausea, numbness, tingling, visual change or vomiting. She has tried NSAID for the symptoms. The treatment provided mild relief.  Shortness of Breath  This is a chronic (worsen since fall) problem. The current episode started in the past 7 days. The problem has been gradually worsening. Pertinent negatives include no abdominal pain, chest pain, claudication, coryza, ear pain, fever, headaches, hemoptysis, leg pain, leg swelling, neck pain, PND, rash, rhinorrhea, sore throat, sputum production, swollen glands, syncope, vomiting or wheezing. Exacerbated by: s/p fall. Risk factors include smoking. She has tried ipratropium inhalers and beta agonist inhalers for the symptoms. The treatment provided mild relief. Her past medical history is significant for COPD.    No problem-specific Assessment & Plan notes found for this encounter.   Past Medical History:  Diagnosis Date  . Breast cancer ([place]) 2004   left lumpectomy  . COPD (chronic obstructive pulmonary disease) ([place])   . Personal history of radiation therapy   . Post-menopause bleeding     Past  Surgical History:  Procedure Laterality Date  . APPENDECTOMY    . bilateral tubal    . BREAST BIOPSY Right    milk duct removed -neg  . BREAST BIOPSY Left 2004   lumpectomy/rad  . BREAST LUMPECTOMY Left   . DILATATION & CURETTAGE/HYSTEROSCOPY WITH MYOSURE N/A 06/20/2017   Procedure: DILATATION & CURETTAGE/HYSTEROSCOPY WITH MYOSURE;  Surgeon: Schermerhorn, Gwen Her, MD;  Location: [facility] ORS;  Service: Gynecology;  Laterality: N/A;  . HYSTEROSCOPY W/D&C N/A 06/20/2017   Procedure: DILATATION AND CURETTAGE /HYSTEROSCOPY;  Surgeon: Schermerhorn, Gwen Her, MD;  Location: [facility] ORS;  Service: Gynecology;  Laterality: N/A;  . OVARIAN CYST SURGERY      Family History  Problem Relation Age of Onset  . Heart disease Father   . Breast cancer Sister   . Diabetes Brother   . Breast cancer Maternal Aunt   . Breast cancer Paternal Aunt   . Breast cancer Cousin        cousins-both sides  . Cirrhosis Mother   . Bladder Cancer Sister   . Diabetes Brother   . Parkinson's disease Brother   . Healthy Sister   . Heart disease Sister   . Dementia Sister   . Arthritis Sister   . Breast cancer Sister   . Healthy Brother   . Healthy Brother   . Arthritis Brother   . Colon cancer Cousin     Social History   Socioeconomic History  . Marital status: Married  Spouse name: Not on file  . Number of children: 2  . Years of education: Not on file  . Highest education level: Not on file  Social Needs  . Financial resource strain: Not hard at all  . Food insecurity - worry: Never true  . Food insecurity - inability: Never true  . Transportation needs - medical: No  . Transportation needs - non-medical: No  Occupational History  . Occupation: retired  Tobacco Use  . Smoking status: Current Every Day Smoker    Packs/day: 0.25    Years: 50.00    Pack years: 12.50    Types: Cigarettes  . Smokeless tobacco: Never Used  . Tobacco comment: Wellbutrin to stop smoking  Substance and Sexual Activity   . Alcohol use: No    Alcohol/week: 0.0 oz  . Drug use: No  . Sexual activity: No  Other Topics Concern  . Not on file  Social History Narrative  . Not on file    Allergies  Allergen Reactions  . Penicillins Hives    Has patient had a PCN reaction causing immediate rash, facial/tongue/throat swelling, SOB or lightheadedness with hypotension: No Has patient had a PCN reaction causing severe rash involving mucus membranes or skin necrosis: No Has patient had a PCN reaction that required hospitalization: No Has patient had a PCN reaction occurring within the last 10 years: No If all of the above answers are "NO", then may proceed with Cephalosporin use.   Marland Kitchen Shrimp [Shellfish Allergy] Swelling    Outpatient Medications Prior to Visit  Medication Sig Dispense Refill  . aspirin EC 81 MG tablet Take 81 mg by mouth daily.    Marland Kitchen buPROPion (WELLBUTRIN) 75 MG tablet Take 1 tablet (75 mg total) by mouth 2 (two) times daily. 60 tablet 6  . carboxymethylcellulose (REFRESH PLUS) 0.5 % SOLN Place 1 drop into both eyes 2 (two) times daily as needed (dry eyes).    . cholecalciferol (VITAMIN D) 1000 units tablet Take 1,000 Units by mouth daily.    Marland Kitchen PROAIR HFA 108 (90 Base) MCG/ACT inhaler Inhale 1-2 puffs into the lungs every 6 (six) hours as needed. 1 Inhaler 11  . pyridOXINE (VITAMIN B-6) 100 MG tablet Take 100 mg by mouth daily.    . Tiotropium Bromide-Olodaterol (STIOLTO RESPIMAT) 2.5-2.5 MCG/ACT AERS Inhale 1 Inhaler into the lungs 2 (two) times daily. 1 Inhaler 11  . sulfamethoxazole-trimethoprim (BACTRIM DS,SEPTRA DS) 800-160 MG tablet Take 1 tablet by mouth 2 (two) times daily. (Patient not taking: Reported on 09/01/2017) 20 tablet 0   No facility-administered medications prior to visit.     Review of Systems  Constitutional: Negative for chills, fever, malaise/fatigue and weight loss.  HENT: Negative for ear discharge, ear pain, rhinorrhea and sore throat.   Eyes: Negative for blurred  vision.  Respiratory: Negative for cough, hemoptysis, sputum production, shortness of breath and wheezing.   Cardiovascular: Negative for chest pain, palpitations, claudication, leg swelling, syncope and PND.  Gastrointestinal: Negative for abdominal pain, blood in stool, bowel incontinence, constipation, diarrhea, heartburn, melena, nausea and vomiting.  Genitourinary: Negative for dysuria, frequency, hematuria and urgency.  Musculoskeletal: Negative for back pain, joint pain, myalgias and neck pain.  Skin: Negative for rash.  Neurological: Negative for dizziness, tingling, sensory change, focal weakness, loss of consciousness, numbness and headaches.  Endo/Heme/Allergies: Negative for environmental allergies and polydipsia. Does not bruise/bleed easily.  Psychiatric/Behavioral: Negative for depression and suicidal ideas. The patient is not nervous/anxious and does not have insomnia.  Objective  Vitals:   09/22/17 1014 09/22/17 1015  BP: 120/80   Pulse: 100   SpO2:  94%  Weight: 84 lb (38.1 kg)   Height: 5\' 2"  (1.575 m)     Physical Exam  Constitutional: She is well-developed, well-nourished, and in no distress. No distress.  HENT:  Head: Normocephalic and atraumatic.  Right Ear: External ear normal.  Left Ear: External ear normal.  Nose: Nose normal.  Mouth/Throat: Oropharynx is clear and moist.  Eyes: Conjunctivae and EOM are normal. Pupils are equal, round, and reactive to light. Right eye exhibits no discharge. Left eye exhibits no discharge.  Neck: Normal range of motion. Neck supple. No JVD present. No thyromegaly present.  Cardiovascular: Normal rate, regular rhythm, normal heart sounds and intact distal pulses. Exam reveals no gallop and no friction rub.  No murmur heard. Pulmonary/Chest: Breath sounds normal. She is in respiratory distress. She has no wheezes. She has no rales.  Abdominal: Soft. Bowel sounds are normal. She exhibits no mass. There is no tenderness.  There is no guarding.  Musculoskeletal: Normal range of motion. She exhibits no edema.       Right hip: She exhibits tenderness.       Thoracic back: She exhibits bony tenderness.  Tender trochanteric area  Lymphadenopathy:    She has no cervical adenopathy.  Neurological: She is alert. She has normal reflexes.  Skin: Skin is warm and dry. She is not diaphoretic.  Psychiatric: Mood and affect normal.  Nursing note and vitals reviewed.     Assessment & Plan  Problem List Items Addressed This Visit      Respiratory   Centrilobular emphysema ([place]) - Primary   Relevant Orders   DG Chest 2 View    Other Visit Diagnoses    Fall, initial encounter       Relevant Orders   DG Lumbar Spine Complete   DG Thoracic Spine W/Swimmers   DG HIP UNILAT WITH PELVIS 2-3 VIEWS RIGHT   Thoracolumbar back pain       Relevant Orders   DG Lumbar Spine Complete   DG Thoracic Spine W/Swimmers   Pain of right hip joint       Relevant Orders   DG HIP UNILAT WITH PELVIS 2-3 VIEWS RIGHT   Shortness of breath       Relevant Orders   DG Chest 2 View      No orders of the defined types were placed in this encounter.     Dr. Mac[facility]  09/22/17

## 2017-09-26 ENCOUNTER — Encounter: Payer: Self-pay | Admitting: Family Medicine

## 2017-09-26 ENCOUNTER — Other Ambulatory Visit: Payer: Self-pay

## 2017-09-26 ENCOUNTER — Ambulatory Visit: Payer: PPO | Admitting: Family Medicine

## 2017-09-26 VITALS — BP 124/78 | HR 68 | Ht 62.0 in | Wt 85.8 lb

## 2017-09-26 DIAGNOSIS — W19XXXD Unspecified fall, subsequent encounter: Secondary | ICD-10-CM | POA: Diagnosis not present

## 2017-09-26 DIAGNOSIS — E559 Vitamin D deficiency, unspecified: Secondary | ICD-10-CM

## 2017-09-26 DIAGNOSIS — M810 Age-related osteoporosis without current pathological fracture: Secondary | ICD-10-CM

## 2017-09-26 DIAGNOSIS — M5136 Other intervertebral disc degeneration, lumbar region: Secondary | ICD-10-CM

## 2017-09-26 MED ORDER — VITAMIN D 1000 UNITS PO TABS: 1000.0000 [IU] | ORAL_TABLET | Freq: Every day | ORAL | 3 refills | Status: DC

## 2017-09-26 NOTE — Progress Notes (Signed)
Name: Gabriella Wiggins   MRN: [id number]    DOB: 01-Oct-1940   Date:09/26/2017       Progress Note  Subjective  Chief Complaint  Chief Complaint  Patient presents with  . Fall    Follow up from fall. [place] about 2 weeks ago- fell when husband was outside on the floor. When fell hit back bone. Still very sore in center back, and between both shoulder blades.     Fall  The accident occurred more than 1 week ago. The fall occurred from a stool. She fell from a height of 3 to 5 ft. Point of impact: back. The pain is present in the back. The pain is moderate (improved). The symptoms are aggravated by movement. Pertinent negatives include no abdominal pain, fever, headaches, hematuria, nausea or tingling. She has tried heat for the symptoms. The treatment provided mild relief.    No problem-specific Assessment & Plan notes found for this encounter.   Past Medical History:  Diagnosis Date  . Aortic atherosclerosis ([place])   . Breast cancer ([place]) 2004   left lumpectomy  . COPD (chronic obstructive pulmonary disease) ([place])   . Personal history of radiation therapy   . Post-menopause bleeding     Past Surgical History:  Procedure Laterality Date  . APPENDECTOMY    . bilateral tubal    . BREAST BIOPSY Right    milk duct removed -neg  . BREAST BIOPSY Left 2004   lumpectomy/rad  . BREAST LUMPECTOMY Left   . DILATATION & CURETTAGE/HYSTEROSCOPY WITH MYOSURE N/A 06/20/2017   Procedure: DILATATION & CURETTAGE/HYSTEROSCOPY WITH MYOSURE;  Surgeon: Schermerhorn, Gwen Her, MD;  Location: [facility] ORS;  Service: Gynecology;  Laterality: N/A;  . HYSTEROSCOPY W/D&C N/A 06/20/2017   Procedure: DILATATION AND CURETTAGE /HYSTEROSCOPY;  Surgeon: Schermerhorn, Gwen Her, MD;  Location: [facility] ORS;  Service: Gynecology;  Laterality: N/A;  . OVARIAN CYST SURGERY      Family History  Problem Relation Age of Onset  . Heart disease Father   . Breast cancer Sister   . Diabetes Brother   . Breast cancer Maternal Aunt    . Breast cancer Paternal Aunt   . Breast cancer Cousin        cousins-both sides  . Cirrhosis Mother   . Bladder Cancer Sister   . Diabetes Brother   . Parkinson's disease Brother   . Healthy Sister   . Heart disease Sister   . Dementia Sister   . Arthritis Sister   . Breast cancer Sister   . Healthy Brother   . Healthy Brother   . Arthritis Brother   . Colon cancer Cousin     Social History   Socioeconomic History  . Marital status: Married    Spouse name: Not on file  . Number of children: 2  . Years of education: Not on file  . Highest education level: Not on file  Social Needs  . Financial resource strain: Not hard at all  . Food insecurity - worry: Never true  . Food insecurity - inability: Never true  . Transportation needs - medical: No  . Transportation needs - non-medical: No  Occupational History  . Occupation: retired  Tobacco Use  . Smoking status: Current Every Day Smoker    Packs/day: 0.25    Years: 50.00    Pack years: 12.50    Types: Cigarettes  . Smokeless tobacco: Never Used  . Tobacco comment: Wellbutrin to stop smoking  Substance and Sexual Activity  . Alcohol  use: No    Alcohol/week: 0.0 oz  . Drug use: No  . Sexual activity: No  Other Topics Concern  . Not on file  Social History Narrative  . Not on file    Allergies  Allergen Reactions  . Penicillin G Other (See Comments)  . Penicillins Hives    Has patient had a PCN reaction causing immediate rash, facial/tongue/throat swelling, SOB or lightheadedness with hypotension: No Has patient had a PCN reaction causing severe rash involving mucus membranes or skin necrosis: No Has patient had a PCN reaction that required hospitalization: No Has patient had a PCN reaction occurring within the last 10 years: No If all of the above answers are "NO", then may proceed with Cephalosporin use.   Marland Kitchen Shrimp [Shellfish Allergy] Swelling    Outpatient Medications Prior to Visit  Medication Sig  Dispense Refill  . aspirin EC 81 MG tablet Take 81 mg by mouth daily.    Marland Kitchen buPROPion (WELLBUTRIN) 75 MG tablet Take 1 tablet (75 mg total) by mouth 2 (two) times daily. 60 tablet 6  . carboxymethylcellulose (REFRESH PLUS) 0.5 % SOLN Place 1 drop into both eyes 2 (two) times daily as needed (dry eyes).    Marland Kitchen PROAIR HFA 108 (90 Base) MCG/ACT inhaler Inhale 1-2 puffs into the lungs every 6 (six) hours as needed. 1 Inhaler 11  . pyridOXINE (VITAMIN B-6) 100 MG tablet Take 100 mg by mouth daily.    . Tiotropium Bromide-Olodaterol (STIOLTO RESPIMAT) 2.5-2.5 MCG/ACT AERS Inhale 1 Inhaler into the lungs 2 (two) times daily. 1 Inhaler 11  . cholecalciferol (VITAMIN D) 1000 units tablet Take 1,000 Units by mouth daily.     No facility-administered medications prior to visit.     Review of Systems  Constitutional: Negative for chills, fever, malaise/fatigue and weight loss.  HENT: Negative for ear discharge, ear pain and sore throat.   Eyes: Negative for blurred vision.  Respiratory: Negative for cough, sputum production, shortness of breath and wheezing.   Cardiovascular: Negative for chest pain, palpitations and leg swelling.  Gastrointestinal: Negative for abdominal pain, blood in stool, constipation, diarrhea, heartburn, melena and nausea.  Genitourinary: Negative for dysuria, frequency, hematuria and urgency.  Musculoskeletal: Positive for back pain and myalgias. Negative for joint pain and neck pain.  Skin: Negative for rash.  Neurological: Negative for dizziness, tingling, sensory change, focal weakness and headaches.  Endo/Heme/Allergies: Negative for environmental allergies and polydipsia. Does not bruise/bleed easily.  Psychiatric/Behavioral: Negative for depression and suicidal ideas. The patient is not nervous/anxious and does not have insomnia.      Objective  Vitals:   09/26/17 0922  BP: 124/78  Pulse: 68  Weight: 85 lb 12.8 oz (38.9 kg)  Height: 5\' 2"  (1.575 m)    Physical  Exam  Constitutional: She is well-developed, well-nourished, and in no distress. No distress.  HENT:  Head: Normocephalic and atraumatic.  Right Ear: External ear normal.  Left Ear: External ear normal.  Nose: Nose normal.  Mouth/Throat: Oropharynx is clear and moist.  Eyes: Conjunctivae and EOM are normal. Pupils are equal, round, and reactive to light. Right eye exhibits no discharge. Left eye exhibits no discharge.  Neck: Normal range of motion. Neck supple. No JVD present. No thyromegaly present.  Cardiovascular: Normal rate, regular rhythm, normal heart sounds and intact distal pulses. Exam reveals no gallop and no friction rub.  No murmur heard. Pulmonary/Chest: Effort normal and breath sounds normal. She has no wheezes. She has no rales.  Abdominal:  Soft. Bowel sounds are normal. She exhibits no mass. There is no tenderness. There is no guarding.  Musculoskeletal: Normal range of motion. She exhibits no edema.       Thoracic back: She exhibits spasm.       Lumbar back: She exhibits deformity and spasm.  scoliosis  Lymphadenopathy:    She has no cervical adenopathy.  Neurological: She is alert. She has normal reflexes.  Skin: Skin is warm and dry. She is not diaphoretic.  Psychiatric: Mood and affect normal.  Nursing note and vitals reviewed.     Assessment & Plan  Problem List Items Addressed This Visit    None    Visit Diagnoses    Degenerative disc disease, lumbar    -  Primary   Fall, subsequent encounter       Age-related osteoporosis without current pathological fracture       cont vit D   Relevant Medications   cholecalciferol (VITAMIN D) 1000 units tablet   Vitamin D deficiency       Relevant Medications   cholecalciferol (VITAMIN D) 1000 units tablet      Meds ordered this encounter  Medications  . cholecalciferol (VITAMIN D) 1000 units tablet    Sig: Take 1 tablet (1,000 Units total) by mouth daily.    Dispense:  100 tablet    Refill:  3       Dr. Otilio Miu [facility] [place] [facility]  09/26/17

## 2017-11-02 ENCOUNTER — Other Ambulatory Visit: Payer: Self-pay | Admitting: Family Medicine

## 2017-11-02 DIAGNOSIS — F17210 Nicotine dependence, cigarettes, uncomplicated: Secondary | ICD-10-CM

## 2017-12-30 ENCOUNTER — Other Ambulatory Visit: Payer: Self-pay | Admitting: Family Medicine

## 2017-12-30 DIAGNOSIS — F17210 Nicotine dependence, cigarettes, uncomplicated: Secondary | ICD-10-CM

## 2018-01-21 ENCOUNTER — Other Ambulatory Visit: Payer: Self-pay | Admitting: Family Medicine

## 2018-01-21 DIAGNOSIS — J432 Centrilobular emphysema: Secondary | ICD-10-CM

## 2018-04-27 ENCOUNTER — Other Ambulatory Visit: Payer: Self-pay | Admitting: Family Medicine

## 2018-04-27 DIAGNOSIS — F17210 Nicotine dependence, cigarettes, uncomplicated: Secondary | ICD-10-CM

## 2018-05-31 ENCOUNTER — Other Ambulatory Visit: Payer: Self-pay | Admitting: Family Medicine

## 2018-05-31 DIAGNOSIS — J432 Centrilobular emphysema: Secondary | ICD-10-CM

## 2018-06-06 ENCOUNTER — Ambulatory Visit
Admission: RE | Admit: 2018-06-06 | Discharge: 2018-06-06 | Disposition: A | Discharge status: Home / Self Care | Payer: PPO | Source: Ambulatory Visit | Attending: Family Medicine | Admitting: Family Medicine

## 2018-06-06 ENCOUNTER — Encounter: Payer: Self-pay | Admitting: Family Medicine

## 2018-06-06 ENCOUNTER — Ambulatory Visit (INDEPENDENT_AMBULATORY_CARE_PROVIDER_SITE_OTHER): Payer: PPO | Admitting: Family Medicine

## 2018-06-06 VITALS — BP 100/62 | HR 100 | Ht 62.0 in | Wt 79.0 lb

## 2018-06-06 DIAGNOSIS — Z1231 Encounter for screening mammogram for malignant neoplasm of breast: Secondary | ICD-10-CM | POA: Diagnosis not present

## 2018-06-06 DIAGNOSIS — J432 Centrilobular emphysema: Secondary | ICD-10-CM | POA: Insufficient documentation

## 2018-06-06 DIAGNOSIS — R634 Abnormal weight loss: Secondary | ICD-10-CM | POA: Insufficient documentation

## 2018-06-06 DIAGNOSIS — I700 Atherosclerosis of aorta: Secondary | ICD-10-CM

## 2018-06-06 DIAGNOSIS — R0602 Shortness of breath: Secondary | ICD-10-CM | POA: Diagnosis not present

## 2018-06-06 DIAGNOSIS — R0600 Dyspnea, unspecified: Secondary | ICD-10-CM | POA: Diagnosis present

## 2018-06-06 DIAGNOSIS — F17210 Nicotine dependence, cigarettes, uncomplicated: Secondary | ICD-10-CM

## 2018-06-06 DIAGNOSIS — M810 Age-related osteoporosis without current pathological fracture: Secondary | ICD-10-CM

## 2018-06-06 DIAGNOSIS — E559 Vitamin D deficiency, unspecified: Secondary | ICD-10-CM

## 2018-06-06 HISTORY — DX: Atherosclerosis of aorta: I70.0

## 2018-06-06 MED ORDER — VITAMIN D 1000 UNITS PO TABS: 1000.0000 [IU] | ORAL_TABLET | Freq: Every day | ORAL | 3 refills | Status: DC

## 2018-06-06 MED ORDER — PROAIR HFA 108 (90 BASE) MCG/ACT IN AERS: 1.0000 | INHALATION_SPRAY | Freq: Four times a day (QID) | RESPIRATORY_TRACT | 11 refills | Status: DC | PRN

## 2018-06-06 MED ORDER — IPRATROPIUM-ALBUTEROL 20-100 MCG/ACT IN AERS: 1.0000 | INHALATION_SPRAY | Freq: Two times a day (BID) | RESPIRATORY_TRACT | 11 refills | Status: DC | PRN

## 2018-06-06 MED ORDER — TIOTROPIUM BROMIDE-OLODATEROL 2.5-2.5 MCG/ACT IN AERS: 1.0000 | INHALATION_SPRAY | Freq: Two times a day (BID) | RESPIRATORY_TRACT | 11 refills | Status: DC

## 2018-06-06 MED ORDER — BUPROPION HCL 75 MG PO TABS: 75.0000 mg | ORAL_TABLET | Freq: Two times a day (BID) | ORAL | 11 refills | Status: DC

## 2018-06-06 NOTE — Assessment & Plan Note (Addendum)
Chronic Persistent. Continue combivent

## 2018-06-06 NOTE — Assessment & Plan Note (Signed)
Chronic. 

## 2018-06-06 NOTE — Progress Notes (Signed)
Name: Gabriella Wiggins   MRN: [id number]    DOB: 07/16/1941   Date:06/06/2018       Progress Note  Subjective  Chief Complaint  Chief Complaint  Patient presents with  . COPD    Shortness of Breath  This is a chronic (for copd) problem. The current episode started more than 1 year ago. The problem occurs daily. The problem has been waxing and waning. Pertinent negatives include no abdominal pain, chest pain, claudication, coryza, ear pain, fever, headaches, hemoptysis, leg pain, leg swelling, neck pain, orthopnea, rash, rhinorrhea, sore throat, sputum production, swollen glands, syncope or wheezing. She has tried beta agonist inhalers, ipratropium inhalers and steroid inhalers for the symptoms. The treatment provided moderate relief. Her past medical history is significant for COPD. There is no history of allergies, asthma, CAD, chronic lung disease, DVT, PE or pneumonia.  Thyroid Problem  Presents for follow-up visit. Symptoms include tremors and weight loss. Patient reports no anxiety, cold intolerance, constipation, depressed mood, diaphoresis, diarrhea, dry skin, fatigue, heat intolerance, hoarse voice, leg swelling, palpitations, visual change or weight gain. The symptoms have been worsening (increased weight loss).    Centrilobular emphysema (HCC) Chronic Persistent. Continue combivent  Atherosclerosis of aorta ([place]) Chronic    Past Medical History:  Diagnosis Date  . Aortic atherosclerosis ([place])   . Breast cancer ([place]) 2004   left lumpectomy  . COPD (chronic obstructive pulmonary disease) ([place])   . Personal history of radiation therapy   . Post-menopause bleeding     Past Surgical History:  Procedure Laterality Date  . APPENDECTOMY    . bilateral tubal    . BREAST BIOPSY Right    milk duct removed -neg  . BREAST BIOPSY Left 2004   lumpectomy/rad  . BREAST LUMPECTOMY Left   . DILATATION & CURETTAGE/HYSTEROSCOPY WITH MYOSURE N/A 06/20/2017   Procedure: DILATATION &  CURETTAGE/HYSTEROSCOPY WITH MYOSURE;  Surgeon: Schermerhorn, Gwen Her, MD;  Location: [facility] ORS;  Service: Gynecology;  Laterality: N/A;  . HYSTEROSCOPY W/D&C N/A 06/20/2017   Procedure: DILATATION AND CURETTAGE /HYSTEROSCOPY;  Surgeon: Schermerhorn, Gwen Her, MD;  Location: [facility] ORS;  Service: Gynecology;  Laterality: N/A;  . OVARIAN CYST SURGERY      Family History  Problem Relation Age of Onset  . Heart disease Father   . Breast cancer Sister   . Diabetes Brother   . Breast cancer Maternal Aunt   . Breast cancer Paternal Aunt   . Breast cancer Cousin        cousins-both sides  . Cirrhosis Mother   . Bladder Cancer Sister   . Diabetes Brother   . Parkinson's disease Brother   . Healthy Sister   . Heart disease Sister   . Dementia Sister   . Arthritis Sister   . Breast cancer Sister   . Healthy Brother   . Healthy Brother   . Arthritis Brother   . Colon cancer Cousin     Social History   Socioeconomic History  . Marital status: Married    Spouse name: Not on file  . Number of children: 2  . Years of education: Not on file  . Highest education level: Not on file  Occupational History  . Occupation: retired  Scientific laboratory technician  . Financial resource strain: Not hard at all  . Food insecurity:    Worry: Never true    Inability: Never true  . Transportation needs:    Medical: No    Non-medical: No  Tobacco Use  .  Smoking status: Current Every Day Smoker    Packs/day: 0.25    Years: 50.00    Pack years: 12.50    Types: Cigarettes  . Smokeless tobacco: Never Used  . Tobacco comment: "down to 1 a day"  Substance and Sexual Activity  . Alcohol use: No    Alcohol/week: 0.0 standard drinks  . Drug use: No  . Sexual activity: Never  Lifestyle  . Physical activity:    Days per week: 0 days    Minutes per session: 0 min  . Stress: Not at all  Relationships  . Social connections:    Talks on phone: More than three times a week    Gets together: Three times a week     Attends religious service: 1 to 4 times per year    Active member of club or organization: Yes    Attends meetings of clubs or organizations: More than 4 times per year    Relationship status: Married  . Intimate partner violence:    Fear of current or ex partner: No    Emotionally abused: No    Physically abused: No    Forced sexual activity: No  Other Topics Concern  . Not on file  Social History Narrative  . Not on file    Allergies  Allergen Reactions  . Penicillin G Other (See Comments)  . Penicillins Hives    Has patient had a PCN reaction causing immediate rash, facial/tongue/throat swelling, SOB or lightheadedness with hypotension: No Has patient had a PCN reaction causing severe rash involving mucus membranes or skin necrosis: No Has patient had a PCN reaction that required hospitalization: No Has patient had a PCN reaction occurring within the last 10 years: No If all of the above answers are "NO", then may proceed with Cephalosporin use.   Marland Kitchen Shrimp [Shellfish Allergy] Swelling    Outpatient Medications Prior to Visit  Medication Sig Dispense Refill  . aspirin EC 81 MG tablet Take 81 mg by mouth daily.    . carboxymethylcellulose (REFRESH PLUS) 0.5 % SOLN Place 1 drop into both eyes 2 (two) times daily as needed (dry eyes).    . pyridOXINE (VITAMIN B-6) 100 MG tablet Take 100 mg by mouth daily.    Marland Kitchen buPROPion (WELLBUTRIN) 75 MG tablet TAKE 1 TABLET BY MOUTH TWICE A DAY 60 tablet 0  . cholecalciferol (VITAMIN D) 1000 units tablet Take 1 tablet (1,000 Units total) by mouth daily. 100 tablet 3  . Ipratropium-Albuterol (COMBIVENT RESPIMAT) 20-100 MCG/ACT AERS respimat Inhale 1 puff into the lungs 2 (two) times daily as needed for wheezing or shortness of breath. 1 Inhaler 11  . PROAIR HFA 108 (90 Base) MCG/ACT inhaler INHALE 1-2 PUFFS INTO THE LUNGS EVERY 6 (SIX) HOURS AS NEEDED. 1 Inhaler 0  . Tiotropium Bromide-Olodaterol (STIOLTO RESPIMAT) 2.5-2.5 MCG/ACT AERS Inhale 1  Inhaler into the lungs 2 (two) times daily. 1 Inhaler 11   No facility-administered medications prior to visit.     Review of Systems  Constitutional: Positive for weight loss. Negative for chills, diaphoresis, fatigue, fever, malaise/fatigue and weight gain.  HENT: Negative for ear discharge, ear pain, hoarse voice, rhinorrhea and sore throat.   Eyes: Negative for blurred vision.  Respiratory: Positive for shortness of breath. Negative for cough, hemoptysis, sputum production and wheezing.   Cardiovascular: Negative for chest pain, palpitations, orthopnea, claudication, leg swelling and syncope.  Gastrointestinal: Negative for abdominal pain, blood in stool, constipation, diarrhea, heartburn, melena and nausea.  Genitourinary: Negative  for dysuria, frequency, hematuria and urgency.  Musculoskeletal: Negative for back pain, joint pain, myalgias and neck pain.  Skin: Negative for rash.  Neurological: Positive for tremors. Negative for dizziness, tingling, sensory change, focal weakness, weakness and headaches.  Endo/Heme/Allergies: Negative for environmental allergies, cold intolerance, heat intolerance and polydipsia. Does not bruise/bleed easily.  Psychiatric/Behavioral: Negative for depression and suicidal ideas. The patient is not nervous/anxious and does not have insomnia.      Objective  Vitals:   06/06/18 1337  BP: 100/62  Pulse: 100  Weight: 79 lb (35.8 kg)  Height: 5\' 2"  (1.575 m)    Physical Exam  Constitutional: She is oriented to person, place, and time. She appears well-developed and well-nourished.  HENT:  Head: Normocephalic.  Right Ear: External ear normal.  Left Ear: External ear normal.  Mouth/Throat: Oropharynx is clear and moist.  Eyes: Pupils are equal, round, and reactive to light. Conjunctivae and EOM are normal. Lids are everted and swept, no foreign bodies found. Left eye exhibits no hordeolum. No foreign body present in the left eye. Right conjunctiva is  not injected. Left conjunctiva is not injected. No scleral icterus.  Neck: Normal range of motion. Neck supple. No JVD present. No tracheal deviation present. No thyromegaly present.  Cardiovascular: Normal rate, regular rhythm, normal heart sounds and intact distal pulses. Exam reveals no gallop and no friction rub.  No murmur heard. Pulmonary/Chest: Effort normal. No stridor. No respiratory distress. She has decreased breath sounds. She has no wheezes. She has no rhonchi. She has no rales.  Abdominal: Soft. Bowel sounds are normal. She exhibits no mass. There is no hepatosplenomegaly. There is no tenderness. There is no rebound and no guarding.  Musculoskeletal: Normal range of motion. She exhibits no edema or tenderness.  Lymphadenopathy:    She has no cervical adenopathy.  Neurological: She is alert and oriented to person, place, and time. She has normal strength. She displays normal reflexes. No cranial nerve deficit.  Skin: Skin is warm. No rash noted.  Psychiatric: She has a normal mood and affect. Her mood appears not anxious. She does not exhibit a depressed mood.  Nursing note and vitals reviewed.     Assessment & Plan  Problem List Items Addressed This Visit      Cardiovascular and Mediastinum   Atherosclerosis of aorta (HCC)    Chronic         Respiratory   Centrilobular emphysema (HCC)    Chronic Persistent. Continue combivent      Relevant Medications   Tiotropium Bromide-Olodaterol (STIOLTO RESPIMAT) 2.5-2.5 MCG/ACT AERS   Ipratropium-Albuterol (COMBIVENT RESPIMAT) 20-100 MCG/ACT AERS respimat   PROAIR HFA 108 (90 Base) MCG/ACT inhaler   Other Relevant Orders   DG Chest 2 View    Other Visit Diagnoses    Weight loss    -  Primary   Progressive Will evaluate with: cbc,renal,hepatic, tsh, hiv/with mammogram,chest film. Check guiac.    Relevant Orders   CBC with Differential/Platelet   TSH   HIV antibody   Renal Function Panel   Hepatic Function Panel (6)    DG Chest 2 View   TB Skin Test (Completed)   Age-related osteoporosis without current pathological fracture       cont vit D   Relevant Medications   cholecalciferol (VITAMIN D) 1000 units tablet   Vitamin D deficiency       continue vitamin d   Relevant Medications   cholecalciferol (VITAMIN D) 1000 units tablet   Cigarette  nicotine dependence without complication       Relevant Medications   buPROPion (WELLBUTRIN) 75 MG tablet   Breast cancer screening by mammogram       ordered mammogram   Relevant Orders   MM 3D SCREEN BREAST BILATERAL      Meds ordered this encounter  Medications  . cholecalciferol (VITAMIN D) 1000 units tablet    Sig: Take 1 tablet (1,000 Units total) by mouth daily.    Dispense:  100 tablet    Refill:  3  . Tiotropium Bromide-Olodaterol (STIOLTO RESPIMAT) 2.5-2.5 MCG/ACT AERS    Sig: Inhale 1 Inhaler into the lungs 2 (two) times daily.    Dispense:  1 Inhaler    Refill:  11  . Ipratropium-Albuterol (COMBIVENT RESPIMAT) 20-100 MCG/ACT AERS respimat    Sig: Inhale 1 puff into the lungs 2 (two) times daily as needed for wheezing or shortness of breath.    Dispense:  1 Inhaler    Refill:  11  . PROAIR HFA 108 (90 Base) MCG/ACT inhaler    Sig: Inhale 1-2 puffs into the lungs every 6 (six) hours as needed.    Dispense:  1 Inhaler    Refill:  11    Pt has not been seen in 9 months- sched appt  . buPROPion (WELLBUTRIN) 75 MG tablet    Sig: Take 1 tablet (75 mg total) by mouth 2 (two) times daily.    Dispense:  60 tablet    Refill:  11    Need to see in offfice      Dr. Otilio Miu [facility] [place] [facility]  06/06/18

## 2018-06-07 LAB — RENAL FUNCTION PANEL
ALBUMIN: 4.6 g/dL (ref 3.5–4.8)
BUN/Creatinine Ratio: 26 (ref 12–28)
BUN: 19 mg/dL (ref 8–27)
CO2: 28 mmol/L (ref 20–29)
CREATININE: 0.73 mg/dL (ref 0.57–1.00)
Calcium: 10.3 mg/dL (ref 8.7–10.3)
Chloride: 99 mmol/L (ref 96–106)
GFR calc Af Amer: 93 mL/min/{1.73_m2} (ref >59)
GFR, EST NON AFRICAN AMERICAN: 81 mL/min/{1.73_m2} (ref >59)
Glucose: 82 mg/dL (ref 65–99)
PHOSPHORUS: 3.3 mg/dL (ref 2.5–4.5)
Potassium: 4.4 mmol/L (ref 3.5–5.2)
Sodium: 147 mmol/L — ABNORMAL HIGH (ref 134–144)

## 2018-06-07 LAB — CBC WITH DIFFERENTIAL/PLATELET
Basophils Absolute: 0.1 10*3/uL (ref 0.0–0.2)
Basos: 2 %
EOS (ABSOLUTE): 0.1 10*3/uL (ref 0.0–0.4)
Eos: 1 %
Hematocrit: 46 % (ref 34.0–46.6)
Hemoglobin: 15.9 g/dL (ref 11.1–15.9)
IMMATURE GRANULOCYTES: 0 %
Immature Grans (Abs): 0 10*3/uL (ref 0.0–0.1)
Lymphocytes Absolute: 1.4 10*3/uL (ref 0.7–3.1)
Lymphs: 28 %
MCH: 33.7 pg — ABNORMAL HIGH (ref 26.6–33.0)
MCHC: 34.6 g/dL (ref 31.5–35.7)
MCV: 98 fL — ABNORMAL HIGH (ref 79–97)
MONOS ABS: 0.4 10*3/uL (ref 0.1–0.9)
Monocytes: 7 %
NEUTROS PCT: 62 %
Neutrophils Absolute: 3.1 10*3/uL (ref 1.4–7.0)
PLATELETS: 273 10*3/uL (ref 150–450)
RBC: 4.72 x10E6/uL (ref 3.77–5.28)
RDW: 11.8 % — AB (ref 12.3–15.4)
WBC: 5 10*3/uL (ref 3.4–10.8)

## 2018-06-07 LAB — TSH: TSH: 1.57 u[IU]/mL (ref 0.450–4.500)

## 2018-06-07 LAB — HEPATIC FUNCTION PANEL (6)
ALK PHOS: 133 IU/L — AB (ref 39–117)
ALT: 13 IU/L (ref 0–32)
AST: 23 IU/L (ref 0–40)
BILIRUBIN, DIRECT: 0.17 mg/dL (ref 0.00–0.40)
Bilirubin Total: 0.8 mg/dL (ref 0.0–1.2)

## 2018-06-07 LAB — HIV ANTIBODY (ROUTINE TESTING W REFLEX): HIV Screen 4th Generation wRfx: NONREACTIVE

## 2018-06-08 ENCOUNTER — Other Ambulatory Visit: Payer: Self-pay

## 2018-06-08 DIAGNOSIS — J432 Centrilobular emphysema: Secondary | ICD-10-CM

## 2018-06-08 DIAGNOSIS — R634 Abnormal weight loss: Secondary | ICD-10-CM

## 2018-06-13 ENCOUNTER — Other Ambulatory Visit (INDEPENDENT_AMBULATORY_CARE_PROVIDER_SITE_OTHER): Payer: PPO

## 2018-06-13 ENCOUNTER — Ambulatory Visit
Admission: RE | Admit: 2018-06-13 | Discharge: 2018-06-13 | Disposition: A | Discharge status: Home / Self Care | Payer: PPO | Source: Ambulatory Visit | Attending: Family Medicine | Admitting: Family Medicine

## 2018-06-13 DIAGNOSIS — J432 Centrilobular emphysema: Secondary | ICD-10-CM | POA: Diagnosis not present

## 2018-06-13 DIAGNOSIS — R634 Abnormal weight loss: Secondary | ICD-10-CM | POA: Insufficient documentation

## 2018-06-13 DIAGNOSIS — N2889 Other specified disorders of kidney and ureter: Secondary | ICD-10-CM | POA: Diagnosis not present

## 2018-06-13 DIAGNOSIS — R1012 Left upper quadrant pain: Secondary | ICD-10-CM | POA: Diagnosis not present

## 2018-06-13 LAB — HEMOCCULT GUIAC POC 1CARD (OFFICE)
Card #2 Fecal Occult Blod, POC: NEGATIVE
FECAL OCCULT BLD: NEGATIVE
Fecal Occult Blood, POC: NEGATIVE

## 2018-06-13 LAB — TB SKIN TEST
INDURATION: 0 mm
TB SKIN TEST: NEGATIVE

## 2018-06-29 ENCOUNTER — Ambulatory Visit: Payer: PPO

## 2018-07-23 IMAGING — CR DG THORACIC SPINE 3V
3 series · 3 of 3 positions shown · non-contrast
Comparison: 04/04/2017

CLINICAL DATA: Fall with back pain

EXAM:
THORACIC SPINE - 3 VIEWS

[t-spine ap]
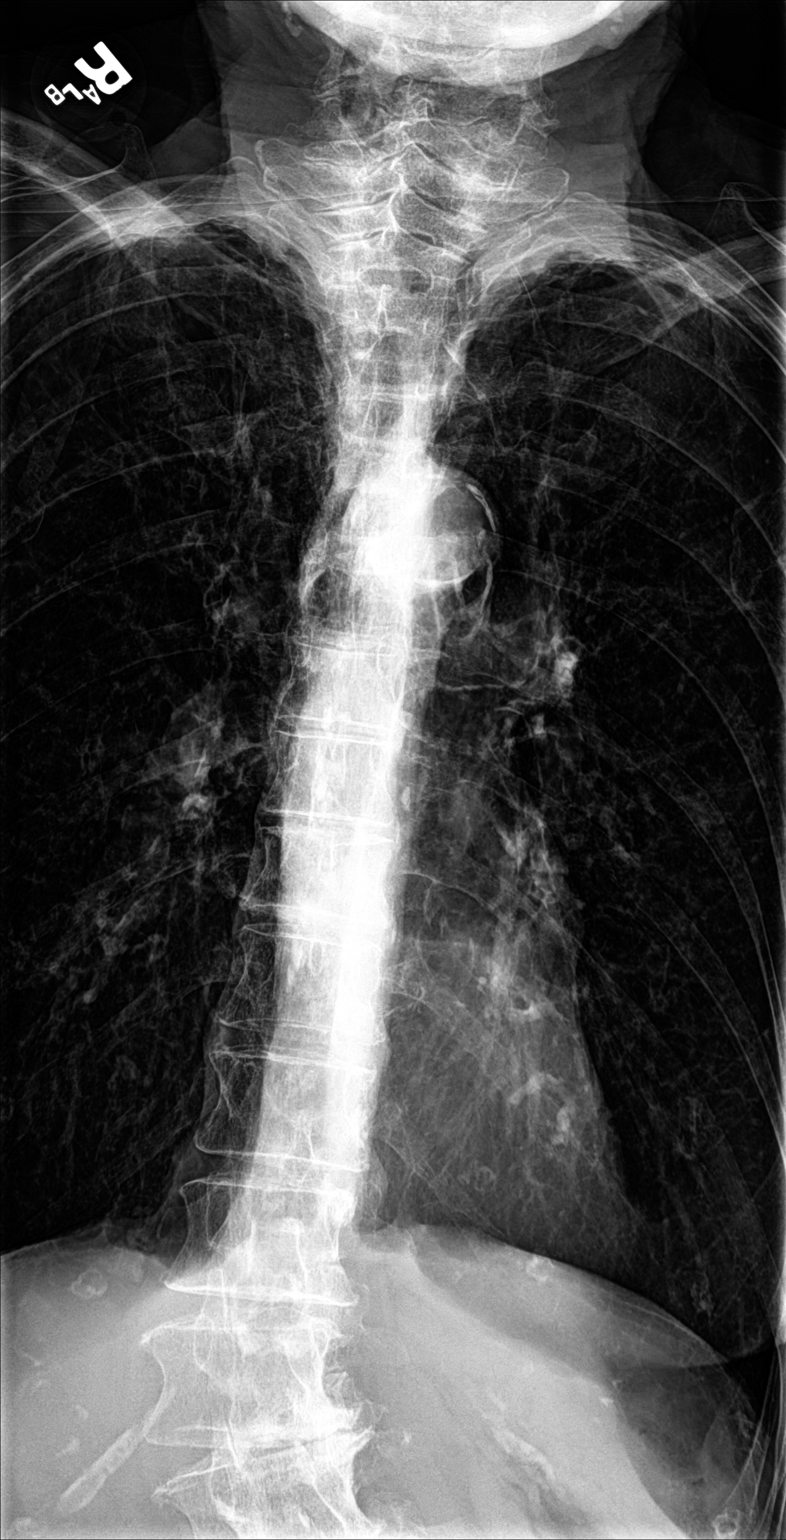

[t-spine lat]
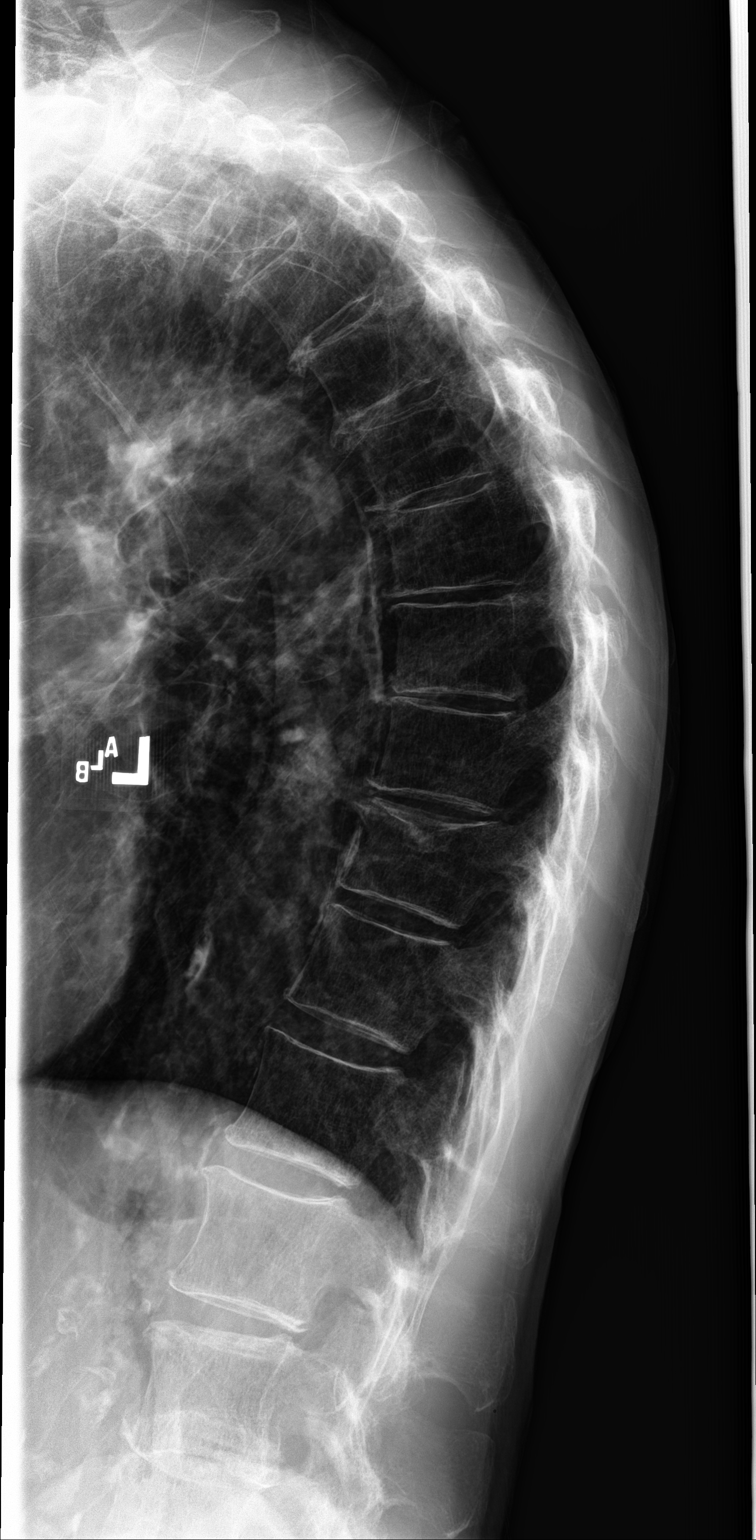

[t-spine swimmers]
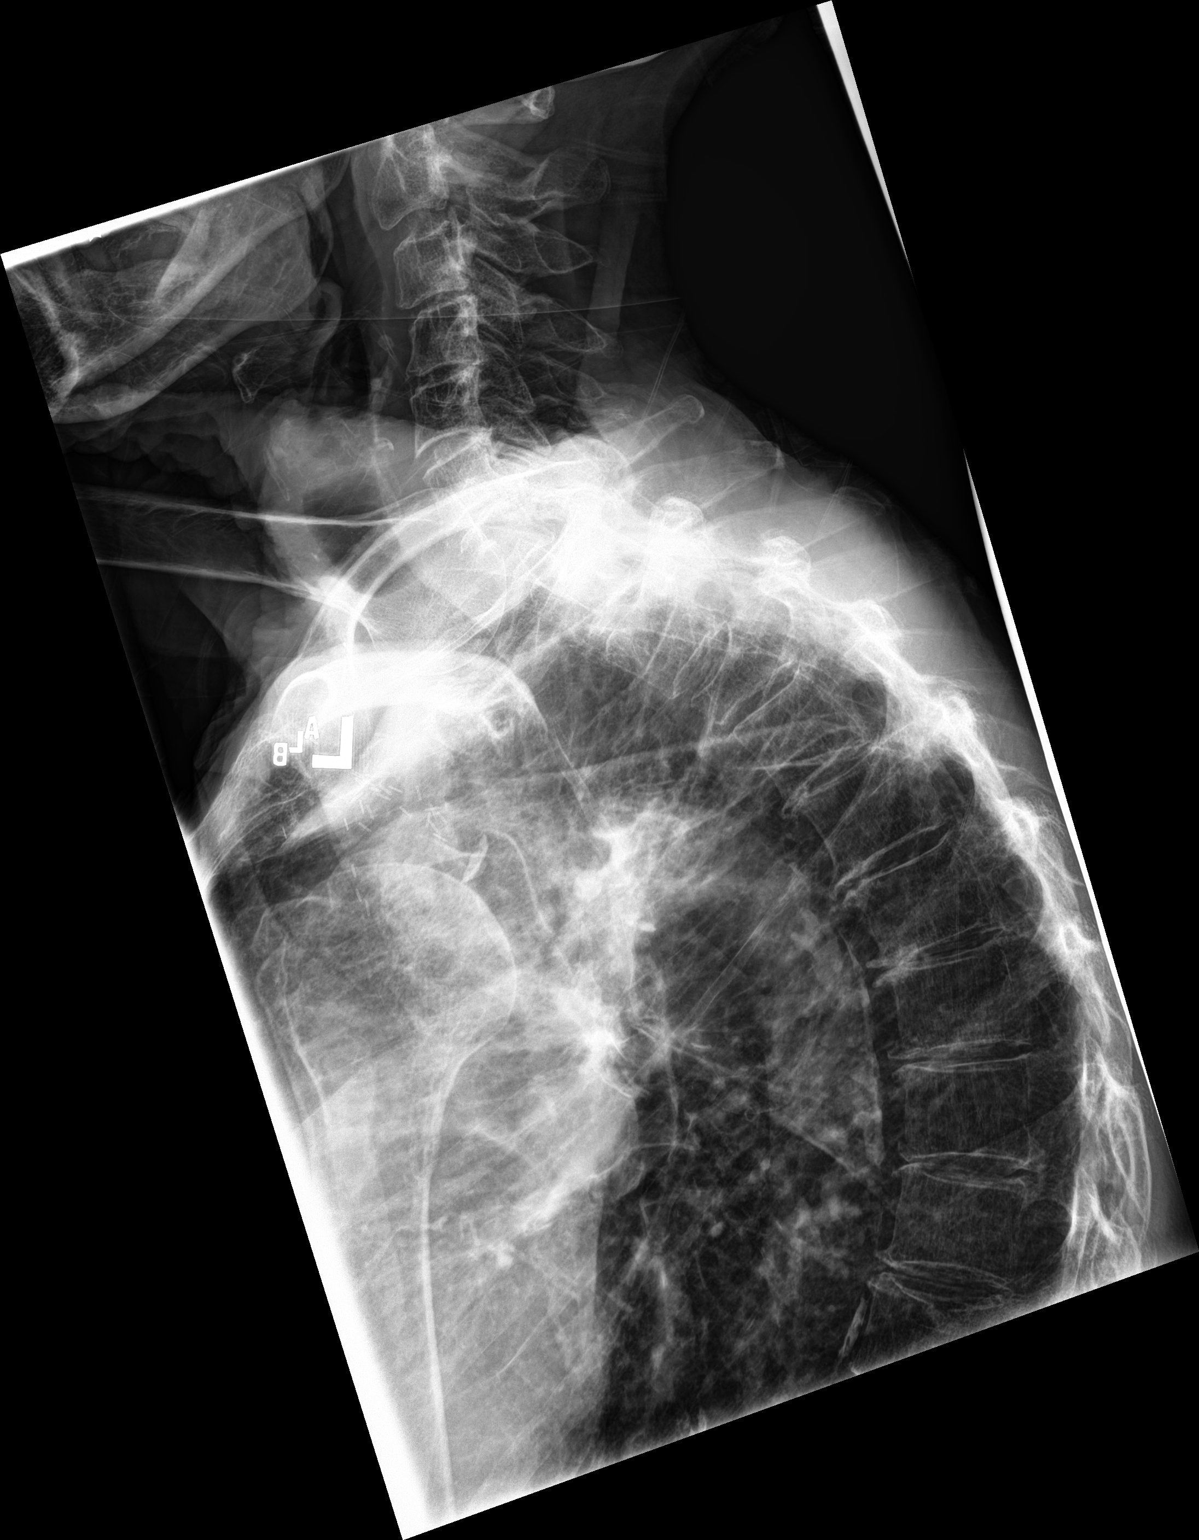

[3 of 3 positions shown; findings below may reference images not displayed]

FINDINGS: Chronic superior endplate deformity at T10. Remaining vertebral body
heights are normal. There are mild degenerative changes.
IMPRESSION: Mild chronic superior endplate deformity at T10. No acute osseous
abnormality.

## 2018-08-31 ENCOUNTER — Ambulatory Visit
Admission: EM | Admit: 2018-08-31 | Discharge: 2018-08-31 | Disposition: A | Discharge status: Other Acute Inpatient Hospital | Payer: PPO | Source: Home / Self Care | Attending: Family Medicine | Admitting: Family Medicine

## 2018-08-31 ENCOUNTER — Other Ambulatory Visit: Payer: Self-pay

## 2018-08-31 ENCOUNTER — Emergency Department: Payer: PPO

## 2018-08-31 ENCOUNTER — Inpatient Hospital Stay
Admission: EM | Admit: 2018-08-31 | Discharge: 2018-09-06 | DRG: 190 | Disposition: A | Discharge status: Hospice | Payer: PPO | Attending: Internal Medicine | Admitting: Internal Medicine

## 2018-08-31 ENCOUNTER — Encounter: Payer: Self-pay | Admitting: Emergency Medicine

## 2018-08-31 DIAGNOSIS — R5381 Other malaise: Secondary | ICD-10-CM | POA: Diagnosis not present

## 2018-08-31 DIAGNOSIS — R0602 Shortness of breath: Secondary | ICD-10-CM | POA: Diagnosis not present

## 2018-08-31 DIAGNOSIS — Z853 Personal history of malignant neoplasm of breast: Secondary | ICD-10-CM | POA: Diagnosis not present

## 2018-08-31 DIAGNOSIS — J9601 Acute respiratory failure with hypoxia: Secondary | ICD-10-CM | POA: Diagnosis not present

## 2018-08-31 DIAGNOSIS — R069 Unspecified abnormalities of breathing: Secondary | ICD-10-CM | POA: Diagnosis not present

## 2018-08-31 DIAGNOSIS — Z66 Do not resuscitate: Secondary | ICD-10-CM | POA: Diagnosis not present

## 2018-08-31 DIAGNOSIS — Z681 Body mass index (BMI) 19 or less, adult: Secondary | ICD-10-CM

## 2018-08-31 DIAGNOSIS — R64 Cachexia: Secondary | ICD-10-CM | POA: Diagnosis present

## 2018-08-31 DIAGNOSIS — I2510 Atherosclerotic heart disease of native coronary artery without angina pectoris: Secondary | ICD-10-CM | POA: Diagnosis present

## 2018-08-31 DIAGNOSIS — Z515 Encounter for palliative care: Secondary | ICD-10-CM | POA: Diagnosis not present

## 2018-08-31 DIAGNOSIS — R0902 Hypoxemia: Secondary | ICD-10-CM | POA: Diagnosis not present

## 2018-08-31 DIAGNOSIS — N179 Acute kidney failure, unspecified: Secondary | ICD-10-CM | POA: Diagnosis present

## 2018-08-31 DIAGNOSIS — R7989 Other specified abnormal findings of blood chemistry: Secondary | ICD-10-CM | POA: Diagnosis not present

## 2018-08-31 DIAGNOSIS — E43 Unspecified severe protein-calorie malnutrition: Secondary | ICD-10-CM | POA: Diagnosis present

## 2018-08-31 DIAGNOSIS — F419 Anxiety disorder, unspecified: Secondary | ICD-10-CM | POA: Diagnosis not present

## 2018-08-31 DIAGNOSIS — R778 Other specified abnormalities of plasma proteins: Secondary | ICD-10-CM

## 2018-08-31 DIAGNOSIS — Z79899 Other long term (current) drug therapy: Secondary | ICD-10-CM

## 2018-08-31 DIAGNOSIS — E875 Hyperkalemia: Secondary | ICD-10-CM | POA: Diagnosis present

## 2018-08-31 DIAGNOSIS — J441 Chronic obstructive pulmonary disease with (acute) exacerbation: Principal | ICD-10-CM | POA: Diagnosis present

## 2018-08-31 DIAGNOSIS — I701 Atherosclerosis of renal artery: Secondary | ICD-10-CM | POA: Diagnosis not present

## 2018-08-31 DIAGNOSIS — R609 Edema, unspecified: Secondary | ICD-10-CM | POA: Diagnosis not present

## 2018-08-31 DIAGNOSIS — I272 Pulmonary hypertension, unspecified: Secondary | ICD-10-CM | POA: Diagnosis not present

## 2018-08-31 DIAGNOSIS — J9621 Acute and chronic respiratory failure with hypoxia: Secondary | ICD-10-CM | POA: Diagnosis present

## 2018-08-31 DIAGNOSIS — I248 Other forms of acute ischemic heart disease: Secondary | ICD-10-CM | POA: Diagnosis present

## 2018-08-31 DIAGNOSIS — F17210 Nicotine dependence, cigarettes, uncomplicated: Secondary | ICD-10-CM | POA: Diagnosis present

## 2018-08-31 DIAGNOSIS — R740 Nonspecific elevation of levels of transaminase and lactic acid dehydrogenase [LDH]: Secondary | ICD-10-CM | POA: Diagnosis not present

## 2018-08-31 DIAGNOSIS — Z880 Allergy status to penicillin: Secondary | ICD-10-CM

## 2018-08-31 DIAGNOSIS — Z7982 Long term (current) use of aspirin: Secondary | ICD-10-CM

## 2018-08-31 DIAGNOSIS — J9811 Atelectasis: Secondary | ICD-10-CM | POA: Diagnosis not present

## 2018-08-31 DIAGNOSIS — R0689 Other abnormalities of breathing: Secondary | ICD-10-CM | POA: Diagnosis not present

## 2018-08-31 DIAGNOSIS — D72829 Elevated white blood cell count, unspecified: Secondary | ICD-10-CM | POA: Diagnosis not present

## 2018-08-31 DIAGNOSIS — I361 Nonrheumatic tricuspid (valve) insufficiency: Secondary | ICD-10-CM | POA: Diagnosis not present

## 2018-08-31 DIAGNOSIS — I25119 Atherosclerotic heart disease of native coronary artery with unspecified angina pectoris: Secondary | ICD-10-CM | POA: Diagnosis not present

## 2018-08-31 DIAGNOSIS — I2781 Cor pulmonale (chronic): Secondary | ICD-10-CM

## 2018-08-31 DIAGNOSIS — I700 Atherosclerosis of aorta: Secondary | ICD-10-CM | POA: Diagnosis not present

## 2018-08-31 DIAGNOSIS — I2729 Other secondary pulmonary hypertension: Secondary | ICD-10-CM | POA: Diagnosis present

## 2018-08-31 DIAGNOSIS — Z7401 Bed confinement status: Secondary | ICD-10-CM | POA: Diagnosis not present

## 2018-08-31 DIAGNOSIS — J432 Centrilobular emphysema: Secondary | ICD-10-CM | POA: Diagnosis not present

## 2018-08-31 DIAGNOSIS — E860 Dehydration: Secondary | ICD-10-CM | POA: Diagnosis present

## 2018-08-31 DIAGNOSIS — I50810 Right heart failure, unspecified: Secondary | ICD-10-CM | POA: Diagnosis present

## 2018-08-31 DIAGNOSIS — R05 Cough: Secondary | ICD-10-CM | POA: Diagnosis not present

## 2018-08-31 DIAGNOSIS — F329 Major depressive disorder, single episode, unspecified: Secondary | ICD-10-CM | POA: Diagnosis present

## 2018-08-31 DIAGNOSIS — R600 Localized edema: Secondary | ICD-10-CM | POA: Diagnosis not present

## 2018-08-31 DIAGNOSIS — Z91013 Allergy to seafood: Secondary | ICD-10-CM

## 2018-08-31 DIAGNOSIS — M2550 Pain in unspecified joint: Secondary | ICD-10-CM | POA: Diagnosis not present

## 2018-08-31 DIAGNOSIS — Z923 Personal history of irradiation: Secondary | ICD-10-CM

## 2018-08-31 DIAGNOSIS — R978 Other abnormal tumor markers: Secondary | ICD-10-CM | POA: Diagnosis not present

## 2018-08-31 HISTORY — DX: Cachexia: R64

## 2018-08-31 HISTORY — DX: Acute respiratory failure with hypoxia: J96.01

## 2018-08-31 HISTORY — DX: Atherosclerotic heart disease of native coronary artery without angina pectoris: I25.10

## 2018-08-31 HISTORY — DX: Atherosclerosis of renal artery: I70.1

## 2018-08-31 HISTORY — DX: Tobacco use: Z72.0

## 2018-08-31 LAB — BASIC METABOLIC PANEL
ANION GAP: 11 (ref 5–15)
BUN: 99 mg/dL — ABNORMAL HIGH (ref 8–23)
CO2: 29 mmol/L (ref 22–32)
Calcium: 8.6 mg/dL — ABNORMAL LOW (ref 8.9–10.3)
Chloride: 96 mmol/L — ABNORMAL LOW (ref 98–111)
Creatinine, Ser: 1.92 mg/dL — ABNORMAL HIGH (ref 0.44–1.00)
GFR, EST AFRICAN AMERICAN: 29 mL/min — AB (ref >60)
GFR, EST NON AFRICAN AMERICAN: 25 mL/min — AB (ref >60)
Glucose, Bld: 116 mg/dL — ABNORMAL HIGH (ref 70–99)
Potassium: 6.1 mmol/L — ABNORMAL HIGH (ref 3.5–5.1)
Sodium: 136 mmol/L (ref 135–145)

## 2018-08-31 LAB — CBC WITH DIFFERENTIAL/PLATELET
Abs Immature Granulocytes: 0.16 10*3/uL — ABNORMAL HIGH (ref 0.00–0.07)
BASOS ABS: 0.1 10*3/uL (ref 0.0–0.1)
BASOS PCT: 1 %
Eosinophils Absolute: 0.1 10*3/uL (ref 0.0–0.5)
Eosinophils Relative: 1 %
HCT: 51.3 % — ABNORMAL HIGH (ref 36.0–46.0)
Hemoglobin: 16.7 g/dL — ABNORMAL HIGH (ref 12.0–15.0)
Immature Granulocytes: 1 %
Lymphocytes Relative: 5 %
Lymphs Abs: 0.6 10*3/uL — ABNORMAL LOW (ref 0.7–4.0)
MCH: 33 pg (ref 26.0–34.0)
MCHC: 32.6 g/dL (ref 30.0–36.0)
MCV: 101.4 fL — ABNORMAL HIGH (ref 80.0–100.0)
Monocytes Absolute: 0.4 10*3/uL (ref 0.1–1.0)
Monocytes Relative: 4 %
NRBC: 0.6 % — AB (ref 0.0–0.2)
Neutro Abs: 9.7 10*3/uL — ABNORMAL HIGH (ref 1.7–7.7)
Neutrophils Relative %: 88 %
PLATELETS: 163 10*3/uL (ref 150–400)
RBC: 5.06 MIL/uL (ref 3.87–5.11)
RDW: 13.2 % (ref 11.5–15.5)
WBC: 11 10*3/uL — ABNORMAL HIGH (ref 4.0–10.5)

## 2018-08-31 LAB — MRSA PCR SCREENING: MRSA by PCR: NEGATIVE

## 2018-08-31 LAB — GLUCOSE, CAPILLARY: GLUCOSE-CAPILLARY: 94 mg/dL (ref 70–99)

## 2018-08-31 LAB — APTT: aPTT: 30 seconds (ref 24–36)

## 2018-08-31 LAB — PROTIME-INR
INR: 1.27
PROTHROMBIN TIME: 15.8 s — AB (ref 11.4–15.2)

## 2018-08-31 LAB — TROPONIN I
Troponin I: 0.89 ng/mL (ref <0.03)
Troponin I: 0.77 ng/mL (ref <0.03)

## 2018-08-31 LAB — BRAIN NATRIURETIC PEPTIDE: B Natriuretic Peptide: 2511 pg/mL — ABNORMAL HIGH (ref 0.0–100.0)

## 2018-08-31 MED ORDER — FUROSEMIDE 10 MG/ML IJ SOLN
60.0000 mg | Freq: Once | INTRAMUSCULAR | Status: AC
  Administered 2018-08-31: 60 mg via INTRAVENOUS
  Filled 2018-08-31: qty 8

## 2018-08-31 MED ORDER — LEVOFLOXACIN IN D5W 500 MG/100ML IV SOLN
500.0000 mg | INTRAVENOUS | Status: DC
  Filled 2018-08-31: qty 100

## 2018-08-31 MED ORDER — LEVOFLOXACIN IN D5W 750 MG/150ML IV SOLN
750.0000 mg | Freq: Once | INTRAVENOUS | Status: AC
  Administered 2018-08-31: 750 mg via INTRAVENOUS
  Filled 2018-08-31: qty 150

## 2018-08-31 MED ORDER — METHYLPREDNISOLONE SODIUM SUCC 40 MG IJ SOLR
40.0000 mg | Freq: Two times a day (BID) | INTRAMUSCULAR | Status: DC
  Administered 2018-09-01 – 2018-09-06 (×11): 40 mg via INTRAVENOUS
  Filled 2018-08-31 (×11): qty 1

## 2018-08-31 MED ORDER — HEPARIN (PORCINE) 25000 UT/250ML-% IV SOLN
900.0000 [IU]/h | INTRAVENOUS | Status: DC
  Administered 2018-08-31: 450 [IU]/h via INTRAVENOUS
  Administered 2018-09-02: 800 [IU]/h via INTRAVENOUS
  Administered 2018-09-03: 05:00:00 900 [IU]/h via INTRAVENOUS
  Filled 2018-08-31 (×3): qty 250

## 2018-08-31 MED ORDER — IPRATROPIUM-ALBUTEROL 0.5-2.5 (3) MG/3ML IN SOLN
3.0000 mL | Freq: Four times a day (QID) | RESPIRATORY_TRACT | Status: DC
  Administered 2018-09-01 – 2018-09-02 (×8): 3 mL via RESPIRATORY_TRACT
  Filled 2018-08-31 (×8): qty 3

## 2018-08-31 MED ORDER — ACETAMINOPHEN 325 MG PO TABS: 650.0000 mg | ORAL_TABLET | Freq: Four times a day (QID) | ORAL | Status: DC | PRN

## 2018-08-31 MED ORDER — IOPAMIDOL (ISOVUE-370) INJECTION 76%
84.0000 mL | Freq: Once | INTRAVENOUS | Status: AC | PRN
  Administered 2018-08-31: 84 mL via INTRAVENOUS

## 2018-08-31 MED ORDER — BUDESONIDE 0.5 MG/2ML IN SUSP
0.5000 mg | Freq: Two times a day (BID) | RESPIRATORY_TRACT | Status: DC
  Administered 2018-09-01 – 2018-09-06 (×11): 0.5 mg via RESPIRATORY_TRACT
  Filled 2018-08-31 (×11): qty 2

## 2018-08-31 MED ORDER — VITAMIN D 25 MCG (1000 UNIT) PO TABS
1000.0000 [IU] | ORAL_TABLET | Freq: Every day | ORAL | Status: DC
  Administered 2018-09-01 – 2018-09-05 (×5): 1000 [IU] via ORAL
  Filled 2018-08-31 (×6): qty 1

## 2018-08-31 MED ORDER — BUPROPION HCL 75 MG PO TABS
75.0000 mg | ORAL_TABLET | Freq: Two times a day (BID) | ORAL | Status: DC
  Administered 2018-09-01 – 2018-09-06 (×11): 75 mg via ORAL
  Filled 2018-08-31 (×14): qty 1

## 2018-08-31 MED ORDER — SODIUM CHLORIDE 0.9 % IV SOLN
Freq: Once | INTRAVENOUS | Status: AC
  Administered 2018-08-31: 17:00:00 via INTRAVENOUS

## 2018-08-31 MED ORDER — METHYLPREDNISOLONE SODIUM SUCC 40 MG IJ SOLR: 40.0000 mg | Freq: Four times a day (QID) | INTRAMUSCULAR | Status: DC

## 2018-08-31 MED ORDER — METHYLPREDNISOLONE SODIUM SUCC 125 MG IJ SOLR
60.0000 mg | Freq: Once | INTRAMUSCULAR | Status: AC
  Administered 2018-09-01: 60 mg via INTRAVENOUS
  Filled 2018-08-31: qty 2

## 2018-08-31 MED ORDER — VITAMIN B-6 50 MG PO TABS
200.0000 mg | ORAL_TABLET | Freq: Every day | ORAL | Status: DC
  Administered 2018-09-01 – 2018-09-05 (×5): 200 mg via ORAL
  Filled 2018-08-31 (×7): qty 4

## 2018-08-31 MED ORDER — HEPARIN BOLUS VIA INFUSION
2250.0000 [IU] | Freq: Once | INTRAVENOUS | Status: AC
  Administered 2018-08-31: 2250 [IU] via INTRAVENOUS
  Filled 2018-08-31: qty 2250

## 2018-08-31 MED ORDER — ACETAMINOPHEN 650 MG RE SUPP: 650.0000 mg | Freq: Four times a day (QID) | RECTAL | Status: DC | PRN

## 2018-08-31 MED ORDER — PATIROMER SORBITEX CALCIUM 8.4 G PO PACK
16.8000 g | PACK | Freq: Once | ORAL | Status: AC
  Administered 2018-08-31: 16.8 g via ORAL
  Filled 2018-08-31: qty 2

## 2018-08-31 MED ORDER — ONDANSETRON HCL 4 MG/2ML IJ SOLN: 4.0000 mg | Freq: Four times a day (QID) | INTRAMUSCULAR | Status: DC | PRN

## 2018-08-31 MED ORDER — ONDANSETRON HCL 4 MG PO TABS: 4.0000 mg | ORAL_TABLET | Freq: Four times a day (QID) | ORAL | Status: DC | PRN

## 2018-08-31 MED ORDER — ASPIRIN EC 81 MG PO TBEC
81.0000 mg | DELAYED_RELEASE_TABLET | Freq: Every day | ORAL | Status: DC
  Administered 2018-09-01 – 2018-09-06 (×6): 81 mg via ORAL
  Filled 2018-08-31 (×6): qty 1

## 2018-08-31 NOTE — ED Provider Notes (Signed)
[facility]    CSN: [id number] Arrival date & time: 08/31/18  1320  History   Chief Complaint Chief Complaint  Patient presents with  . Leg Swelling  . Cough    Cough  Associated symptoms: shortness of breath     77 year old female with known COPD presents with the above complaints.  Patient has reported worsening symptoms since September.  She reports cough, shortness of breath.  Also reports bilateral lower extremity edema.  Seems to be worsening since September.  She is had ongoing weight loss.  No reports of fever.  She is not on chronic oxygen therapy.  Continues to smoke.  Significant shortness of breath.  No other associated symptoms.  No other complaints.  Past Medical History:  Diagnosis Date  . Aortic atherosclerosis ([place])   . Breast cancer ([place]) 2004   left lumpectomy  . COPD (chronic obstructive pulmonary disease) ([place])   . Personal history of radiation therapy   . Post-menopause bleeding     Patient Active Problem List   Diagnosis Date Noted  . Atherosclerosis of aorta ([place]) 06/06/2018  . PMB (postmenopausal bleeding) 06/27/2017  . Genetic testing 06/08/2017  . Centrilobular emphysema ([employer]) 09/09/2016  . Hematuria, gross 07/24/2015    Past Surgical History:  Procedure Laterality Date  . APPENDECTOMY    . bilateral tubal    . BREAST BIOPSY Right    milk duct removed -neg  . BREAST BIOPSY Left 2004   lumpectomy/rad  . BREAST LUMPECTOMY Left   . DILATATION & CURETTAGE/HYSTEROSCOPY WITH MYOSURE N/A 06/20/2017   Procedure: DILATATION & CURETTAGE/HYSTEROSCOPY WITH MYOSURE;  Surgeon: Schermerhorn, Gwen Her, MD;  Location: [facility] ORS;  Service: Gynecology;  Laterality: N/A;  . HYSTEROSCOPY W/D&C N/A 06/20/2017   Procedure: DILATATION AND CURETTAGE /HYSTEROSCOPY;  Surgeon: Schermerhorn, Gwen Her, MD;  Location: [facility] ORS;  Service: Gynecology;  Laterality: N/A;  . OVARIAN CYST SURGERY      OB History   None      Home Medications    Prior to  Admission medications   Medication Sig Start Date End Date Taking? Authorizing Provider  aspirin EC 81 MG tablet Take 81 mg by mouth daily.   Yes [provider]  buPROPion (WELLBUTRIN) 75 MG tablet Take 1 tablet (75 mg total) by mouth 2 (two) times daily. 06/06/18  Yes Juline Patch, MD  carboxymethylcellulose (REFRESH PLUS) 0.5 % SOLN Place 1 drop into both eyes 2 (two) times daily as needed (dry eyes).   Yes [provider]  cholecalciferol (VITAMIN D) 1000 units tablet Take 1 tablet (1,000 Units total) by mouth daily. 06/06/18  Yes Juline Patch, MD  Ipratropium-Albuterol (COMBIVENT RESPIMAT) 20-100 MCG/ACT AERS respimat Inhale 1 puff into the lungs 2 (two) times daily as needed for wheezing or shortness of breath. 06/06/18  Yes Juline Patch, MD  PROAIR HFA 108 [phone] Base) MCG/ACT inhaler Inhale 1-2 puffs into the lungs every 6 (six) hours as needed. 06/06/18  Yes Juline Patch, MD  pyridOXINE (VITAMIN B-6) 100 MG tablet Take 100 mg by mouth daily.   Yes [provider]  Tiotropium Bromide-Olodaterol (STIOLTO RESPIMAT) 2.5-2.5 MCG/ACT AERS Inhale 1 Inhaler into the lungs 2 (two) times daily. 06/06/18  Yes Juline Patch, MD    Family History Family History  Problem Relation Age of Onset  . Heart disease Father   . Breast cancer Sister   . Diabetes Brother   . Breast cancer Maternal Aunt   . Breast cancer  Paternal Aunt   . Breast cancer Cousin        cousins-both sides  . Cirrhosis Mother   . Bladder Cancer Sister   . Diabetes Brother   . Parkinson's disease Brother   . Healthy Sister   . Heart disease Sister   . Dementia Sister   . Arthritis Sister   . Breast cancer Sister   . Healthy Brother   . Healthy Brother   . Arthritis Brother   . Colon cancer Cousin     Social History Social History   Tobacco Use  . Smoking status: Current Every Day Smoker    Packs/day: 0.25    Years: 50.00    Pack years: 12.50    Types: Cigarettes  . Smokeless  tobacco: Never Used  . Tobacco comment: "down to 1 a day"  Substance Use Topics  . Alcohol use: No    Alcohol/week: 0.0 standard drinks  . Drug use: No     Allergies   Penicillin g; Penicillins; and Shrimp [shellfish allergy]   Review of Systems Review of Systems  Respiratory: Positive for cough and shortness of breath.   Cardiovascular: Positive for leg swelling.   Physical Exam Triage Vital Signs ED Triage Vitals  Enc Vitals Group     BP 08/31/18 1342 (!) 127/48     Pulse Rate 08/31/18 1342 (!) 107     Resp 08/31/18 1342 (!) 24     Temp 08/31/18 1342 97.8 F (36.6 C)     Temp Source 08/31/18 1342 Oral     SpO2 08/31/18 1342 (!) 74 %     Weight 08/31/18 1335 84 lb (38.1 kg)     Height 08/31/18 1335 5\' 2"  (1.575 m)     Head Circumference --      Peak Flow --      Pain Score 08/31/18 1335 0     Pain Loc --      Pain Edu? --      Excl. in [place]? --    Updated Vital Signs BP (!) 127/48 (BP Location: Left Arm)   Pulse (!) 107   Temp 97.8 F (36.6 C) (Oral)   Resp (!) 24   Ht 5\' 2"  (1.575 m)   Wt 38.1 kg   SpO2 (!) 74%   BMI 15.36 kg/m   Visual Acuity Right Eye Distance:   Left Eye Distance:   Bilateral Distance:    Right Eye Near:   Left Eye Near:    Bilateral Near:     Physical Exam  Constitutional: She is oriented to person, place, and time.  Cachectic.  Increased work of breathing.  HENT:  Head: Normocephalic and atraumatic.  Cardiovascular:  Tachycardic. Regular rhythm.    Pulmonary/Chest:  Diminished throughout. Mild respiratory distress.  Neurological: She is alert and oriented to person, place, and time.  Psychiatric:  Flat affect.  Nursing note and vitals reviewed.  UC Treatments / Results  Labs (all labs ordered are listed, but only abnormal results are displayed) Labs Reviewed - No data to display  EKG None  Radiology No results found.  Procedures Procedures (including critical care time)  Medications Ordered in  UC Medications - No data to display  Initial Impression / Assessment and Plan / UC Course  I have reviewed the triage vital signs and the nursing notes.  Pertinent labs & imaging results that were available during my care of the patient were reviewed by me and considered in my medical decision making (see  chart for details).    77 year old female presents with cough, lower extremity edema.  Patient hypoxic.  Ill-appearing.  Cachectic.  Patient placed on oxygen.  Patient transported via EMS to the hospital for further evaluation and admission.  Final Clinical Impressions(s) / UC Diagnoses   Final diagnoses:  Acute respiratory failure with hypoxia [facility])   Discharge Instructions   None    ED Prescriptions    None     Controlled Substance Prescriptions [place] Controlled Substance Registry consulted? Not Applicable   Coral Spikes, DO 08/31/18 1521

## 2018-08-31 NOTE — Consult Note (Signed)
PULMONARY / CRITICAL CARE MEDICINE   NAME:  Gabriella Wiggins, MRN:  [id number], DOB:  1941/07/24, LOS: 0 ADMISSION DATE:  08/31/2018, CONSULTATION DATE:  12/05    PT PROFILE:    40 F smoker with very advanced emphysema (but has never seen a pulmonologist) admitted via ED with severe hypoxemic respiratory failure, severe lower extremity edema and profound cachexia.  Admitting diagnosis COPD exacerbation.  HISTORY OF PRESENT ILLNESS   As above.  Obtaining history is very difficult due to BiPAP mask and her level of dyspnea when BiPAP mask is removed.  She indicates that she has been told that she has COPD in the past.  She is never been evaluated for oxygen requirements.  She continues to smoke.  Her son died recently and she has been profoundly despondent over that.  She has lost weight steadily over the past few years but in an accelerated fashion since the death of her son.  She was admitted via [facility] ED with severe dyspnea, rattling cough, worsening lower extremity edema.  She initially presented to an urgent care center where she was found to be profoundly hypoxemic and was sent to the emergency department.  DNR status established in the emergency department.  Past Medical History:  Diagnosis Date  . Aortic atherosclerosis ([place])   . Breast cancer ([place]) 2004   left lumpectomy  . COPD (chronic obstructive pulmonary disease) ([place])   . Personal history of radiation therapy   . Post-menopause bleeding      FAMILY HISTORY:   Her family history includes Arthritis in her brother and sister; Bladder Cancer in her sister; Breast cancer in her cousin, maternal aunt, paternal aunt, sister, and sister; Cirrhosis in her mother; Colon cancer in her cousin; Dementia in her sister; Diabetes in her brother and brother; Healthy in her brother, brother, and sister; Heart disease in her father and sister; Parkinson's disease in her brother.  SOCIAL HISTORY:  She  reports that she has been smoking cigarettes. She  has a 25.00 pack-year smoking history. She has never used smokeless tobacco. She reports that she does not drink alcohol or use drugs.  REVIEW OF SYSTEMS:    Level 5 caveat  SIGNIFICANT EVENTS:     STUDIES:   12/05 CT chest: COPD with fibrotic changes and mild basilar atelectasis. No acute infiltrate is seen. Some areas of mucous plugging are noted on the left lower lobe.  Dilated RV consistent with pulmonary hypertension   CULTURES:  MRSA PCR 12/05 >>   ANTIBIOTICS:  Levofloxacin 12/05 >>   LINES/TUBES:     SUBJECTIVE:    CONSTITUTIONAL: BP 111/65   Pulse (!) 106   Temp 99.4 F (37.4 C)   Resp (!) 25   Ht 5\' 2"  (1.575 m)   Wt 38.1 kg   SpO2 98%   BMI 15.36 kg/m   No intake/output data recorded.  PHYSICAL EXAM: General: Profoundly cachectic, adequately supported on BiPAP, LOC normal, cognition appears intact Neuro: CNs intact, moves all extremities, DTRs symmetric HEENT: Temporal wasting, NCAT, sclerae white Cardiovascular: Regular, no M noted Lungs: Hyperresonant to percussion, markedly diminished breath sounds throughout, scattered rhonchi, no wheezes Abdomen: Scaphoid, soft, NT, + BS Extremities: 3+ symmetric pretibial ankle and pedal edema, violaceous digits with diminished pulses but normal cap refill  LABS  Glucose Recent Labs  Lab 08/31/18 2152  GLUCAP 94    BMET Recent Labs  Lab 08/31/18 1458  NA 136  K 6.1*  CL 96*  CO2 29  BUN  99*  CREATININE 1.92*  GLUCOSE 116*    Liver Enzymes No results for input(s): AST, ALT, ALKPHOS, BILITOT, ALBUMIN in the last 168 hours.  Electrolytes Recent Labs  Lab 08/31/18 1458  CALCIUM 8.6*    CBC Recent Labs  Lab 08/31/18 1458  WBC 11.0*  HGB 16.7*  HCT 51.3*  PLT 163    ABG No results for input(s): PHART, PCO2ART, PO2ART in the last 168 hours.  Coag's Recent Labs  Lab 08/31/18 1458  APTT 30  INR 1.27    Sepsis Markers No results for input(s): LATICACIDVEN, PROCALCITON,  O2SATVEN in the last 168 hours.  Cardiac Enzymes Recent Labs  Lab 08/31/18 1458  TROPONINI 0.89*    PAST MEDICAL HISTORY :   She  has a past medical history of Aortic atherosclerosis ([place]), Breast cancer ([place]) (2004), COPD (chronic obstructive pulmonary disease) ([place]), Personal history of radiation therapy, and Post-menopause bleeding.  PAST SURGICAL HISTORY:  She  has a past surgical history that includes Ovarian cyst surgery; Breast biopsy (Right); Breast biopsy (Left, 2004); Breast lumpectomy (Left); bilateral tubal; Appendectomy; Hysteroscopy w/D&C (N/A, 06/20/2017); and Dilatation & curettage/hysteroscopy with myosure (N/A, 06/20/2017).  Allergies  Allergen Reactions  . Penicillin G Other (See Comments)  . Penicillins Hives    Has patient had a PCN reaction causing immediate rash, facial/tongue/throat swelling, SOB or lightheadedness with hypotension: No Has patient had a PCN reaction causing severe rash involving mucus membranes or skin necrosis: No Has patient had a PCN reaction that required hospitalization: No Has patient had a PCN reaction occurring within the last 10 years: No If all of the above answers are "NO", then may proceed with Cephalosporin use.   . Shrimp [Shellfish Allergy] Swelling    No current facility-administered medications on file prior to encounter.    Current Outpatient Medications on File Prior to Encounter  Medication Sig  . aspirin EC 81 MG tablet Take 81 mg by mouth daily.  Marland Kitchen buPROPion (WELLBUTRIN) 75 MG tablet Take 1 tablet (75 mg total) by mouth 2 (two) times daily.  . carboxymethylcellulose (REFRESH PLUS) 0.5 % SOLN Place 1 drop into both eyes 2 (two) times daily as needed (dry eyes).  . cholecalciferol (VITAMIN D) 1000 units tablet Take 1 tablet (1,000 Units total) by mouth daily.  . Ipratropium-Albuterol (COMBIVENT RESPIMAT) 20-100 MCG/ACT AERS respimat Inhale 1 puff into the lungs 2 (two) times daily as needed for wheezing or shortness of  breath.  Marland Kitchen OVER THE COUNTER MEDICATION Apply 1 application topically as needed (CBD Cream).  Marland Kitchen PROAIR HFA 108 (90 Base) MCG/ACT inhaler Inhale 1-2 puffs into the lungs every 6 (six) hours as needed.  . pyridOXINE (VITAMIN B-6) 100 MG tablet Take 200 mg by mouth daily.   . Tiotropium Bromide-Olodaterol (STIOLTO RESPIMAT) 2.5-2.5 MCG/ACT AERS Inhale 1 Inhaler into the lungs 2 (two) times daily.      ASSESSMENT AND PLAN   1) Acute on chronic hypoxemic respiratory failure 2) very advanced emphysema by CT chest 3) acute COPD exacerbation 4) cachexia.  Likely due to end-stage emphysema 5) AKI 6) likely cor pulmonale with elevated BNP and severe lower extremity edema 7) mildly elevated troponin I.  Doubt acute coronary syndrome 8) guarded acute prognosis and poor long-term prognosis.  DNR appropriate  BiPAP as needed for ventilatory support Supplemental O2 to maintain SPO2 >90% Agree with systemic steroids-dose adjusted Agree with nebulized steroids and bronchodilators-regimen adjusted Agree with current empiric antibiotics  Please note that we need to dose all medications  taken to account her diminutive size (38 kg)   Merton Border, MD PCCM service Mobile [phone] Pager [phone] 08/31/2018 11:14 PM

## 2018-08-31 NOTE — ED Triage Notes (Signed)
Pt presents to ed from [facility] urgent care via acems with c/o low oxygen saturation and non-palpable pulses to lower extremities. Pt saturation for ems was 70's% on room air. Pt placed on 10L and saturation at 98%. Seen at Urgent care for bilateral lower extremity edema. Extremeities purple in color and cold to touch for ems. Rhonchi present for ems. Lower extremity edema has been present for about a week. Allergic to penecillin. Hx of COPD.  20G left fa present on arrival.

## 2018-08-31 NOTE — ED Notes (Signed)
Pt husband, Coty Student, asking to be updated while he is not at the hospital with any pt updates. Pt gave permission for information to be released with husband. Don's cell phone number is [phone]

## 2018-08-31 NOTE — ED Notes (Signed)
Jinny Blossom, RN spoke with pharmacy to verify antibiotic.

## 2018-08-31 NOTE — ED Provider Notes (Signed)
[facility] Emergency Department Provider Note  ____________________________________________   I have reviewed the triage vital signs and the nursing notes.   HISTORY  Chief Complaint Leg Swelling and Shortness of Breath   History limited by: Not Limited   HPI Gabriella Wiggins is a 77 y.o. female who presents to the emergency department today from urgent care because of concerns for bilateral leg swelling and hypoxia.  The patient states she has had bilateral leg swelling for the past week or so.  The patient states that she has had some pain in the feet.  At EMS they were unable to palpate pulses in her lower extremities.  She states that she is also have some breathing difficulty.  Patient does have a history of COPD.  At urgent care was found to be 70% on room air.  Patient was put on nonrebreather by EMS.  Per medical record review patient has a history of breast cancer, COPD  Past Medical History:  Diagnosis Date  . Aortic atherosclerosis ([place])   . Breast cancer ([place]) 2004   left lumpectomy  . COPD (chronic obstructive pulmonary disease) ([place])   . Personal history of radiation therapy   . Post-menopause bleeding     Patient Active Problem List   Diagnosis Date Noted  . Atherosclerosis of aorta ([place]) 06/06/2018  . PMB (postmenopausal bleeding) 06/27/2017  . Genetic testing 06/08/2017  . Centrilobular emphysema ([place]) 09/09/2016  . Hematuria, gross 07/24/2015    Past Surgical History:  Procedure Laterality Date  . APPENDECTOMY    . bilateral tubal    . BREAST BIOPSY Right    milk duct removed -neg  . BREAST BIOPSY Left 2004   lumpectomy/rad  . BREAST LUMPECTOMY Left   . DILATATION & CURETTAGE/HYSTEROSCOPY WITH MYOSURE N/A 06/20/2017   Procedure: DILATATION & CURETTAGE/HYSTEROSCOPY WITH MYOSURE;  Surgeon: Schermerhorn, Gwen Her, MD;  Location: [facility] ORS;  Service: Gynecology;  Laterality: N/A;  . HYSTEROSCOPY W/D&C N/A 06/20/2017   Procedure:  DILATATION AND CURETTAGE /HYSTEROSCOPY;  Surgeon: Schermerhorn, Gwen Her, MD;  Location: [facility] ORS;  Service: Gynecology;  Laterality: N/A;  . OVARIAN CYST SURGERY      Prior to Admission medications   Medication Sig Start Date End Date Taking? Authorizing Provider  aspirin EC 81 MG tablet Take 81 mg by mouth daily.    [provider]  buPROPion (WELLBUTRIN) 75 MG tablet Take 1 tablet (75 mg total) by mouth 2 (two) times daily. 06/06/18   Juline Patch, MD  carboxymethylcellulose (REFRESH PLUS) 0.5 % SOLN Place 1 drop into both eyes 2 (two) times daily as needed (dry eyes).    [provider]  cholecalciferol (VITAMIN D) 1000 units tablet Take 1 tablet (1,000 Units total) by mouth daily. 06/06/18   Juline Patch, MD  Ipratropium-Albuterol (COMBIVENT RESPIMAT) 20-100 MCG/ACT AERS respimat Inhale 1 puff into the lungs 2 (two) times daily as needed for wheezing or shortness of breath. 06/06/18   Juline Patch, MD  PROAIR HFA 108 [phone] Base) MCG/ACT inhaler Inhale 1-2 puffs into the lungs every 6 (six) hours as needed. 06/06/18   Juline Patch, MD  pyridOXINE (VITAMIN B-6) 100 MG tablet Take 100 mg by mouth daily.    [provider]  Tiotropium Bromide-Olodaterol (STIOLTO RESPIMAT) 2.5-2.5 MCG/ACT AERS Inhale 1 Inhaler into the lungs 2 (two) times daily. 06/06/18   Juline Patch, MD    Allergies Penicillin g; Penicillins; and Shrimp [shellfish allergy]  Family History  Problem  Relation Age of Onset  . Heart disease Father   . Breast cancer Sister   . Diabetes Brother   . Breast cancer Maternal Aunt   . Breast cancer Paternal Aunt   . Breast cancer Cousin        cousins-both sides  . Cirrhosis Mother   . Bladder Cancer Sister   . Diabetes Brother   . Parkinson's disease Brother   . Healthy Sister   . Heart disease Sister   . Dementia Sister   . Arthritis Sister   . Breast cancer Sister   . Healthy Brother   . Healthy Brother   . Arthritis Brother   .  Colon cancer Cousin     Social History Social History   Tobacco Use  . Smoking status: Current Every Day Smoker    Packs/day: 0.25    Years: 50.00    Pack years: 12.50    Types: Cigarettes  . Smokeless tobacco: Never Used  . Tobacco comment: "down to 1 a day"  Substance Use Topics  . Alcohol use: No    Alcohol/week: 0.0 standard drinks  . Drug use: No    Review of Systems Constitutional: No fever/chills Eyes: No visual changes. ENT: No sore throat. Cardiovascular: Denies chest pain. Respiratory: Positive for shortness of breath. Gastrointestinal: No abdominal pain.  No nausea, no vomiting.  No diarrhea.   Genitourinary: Negative for dysuria. Musculoskeletal: Positive for lower extremity swelling.  Skin: Negative for rash. Neurological: Negative for headaches, focal weakness or numbness.  ____________________________________________   PHYSICAL EXAM:  VITAL SIGNS: ED Triage Vitals [08/31/18 1450]  Enc Vitals Group     BP      Pulse      Resp      Temp      Temp src      SpO2      Weight 84 lb (38.1 kg)     Height 5\' 2"  (1.575 m)     Head Circumference      Peak Flow      Pain Score 0   Constitutional: Alert and oriented.  Eyes: Conjunctivae are normal.  ENT      Head: Normocephalic and atraumatic.      Nose: No congestion/rhinnorhea.      Mouth/Throat: Mucous membranes are moist.      Neck: No stridor. Hematological/Lymphatic/Immunilogical: No cervical lymphadenopathy. Cardiovascular: Tachycardic.  No murmurs, rubs, or gallops.  Respiratory: Increased respiratory effort. Tachypnea. Question rhonchi in lower bases.  Gastrointestinal: Soft and non tender. No rebound. No guarding.  Genitourinary: Deferred Musculoskeletal: Normal range of motion in all extremities. Positive bilateral lower extremity edema from ankles distally. Unable to palpate DP pulse bilaterally.  Neurologic:  Normal speech and language. No gross focal neurologic deficits are appreciated.   Skin:  Some purple/mottling to bilateral feet.  Psychiatric: Mood and affect are normal. Speech and behavior are normal. Patient exhibits appropriate insight and judgment.  ____________________________________________    LABS (pertinent positives/negatives)  Trop 0.89 CBC wbc 11.0, hgb 16.7, plt 163 BMP na 136, k 6.1, glu 116, cr 1.92  ____________________________________________   EKG  I, Nance Pear, attending physician, personally viewed and interpreted this EKG  EKG Time: 1456 Rate: 107 Rhythm: sinus tachycardia Axis: right axis deviation Intervals: qtc 403 QRS: narrow, q waves V1, V2 ST changes: no st elevation Impression: abnormal ekg   ____________________________________________    RADIOLOGY  CT angio PE, b/l lower extremity No blood clot  ____________________________________________   PROCEDURES  Procedures  CRITICAL CARE Performed by: Nance Pear   Total critical care time: 35 minutes  Critical care time was exclusive of separately billable procedures and treating other patients.  Critical care was necessary to treat or prevent imminent or life-threatening deterioration.  Critical care was time spent personally by me on the following activities: development of treatment plan with patient and/or surrogate as well as nursing, discussions with consultants, evaluation of patient's response to treatment, examination of patient, obtaining history from patient or surrogate, ordering and performing treatments and interventions, ordering and review of laboratory studies, ordering and review of radiographic studies, pulse oximetry and re-evaluation of patient's condition.  ____________________________________________   INITIAL IMPRESSION / ASSESSMENT AND PLAN / ED COURSE  Pertinent labs & imaging results that were available during my care of the patient were reviewed by me and considered in my medical decision making (see chart for details).    Patient presented to the emergency department today from urgent care because of concerns for bilateral lower extremity swelling, abdomen pulses and shortness of breath.  On exam patient is quite cachectic.  She does have increased work of breathing.  Does have bilateral swelling to her legs.  I was not able to palpate Doppler dorsalis pedis pulse.  Given shortness of breath and concern for possible lower extremity blood clot CT Angie of the chest and runoff was performed.  This did not show any obvious clot.  Patient's troponin was elevated.  Do wonder if patient might of had a heart attack causing heart failure.  Will place patient on heparin.  Will plan on admission.   ____________________________________________   FINAL CLINICAL IMPRESSION(S) / ED DIAGNOSES  Final diagnoses:  Peripheral edema  Shortness of breath  Elevated troponin     Note: This dictation was prepared with Dragon dictation. Any transcriptional errors that result from this process are unintentional     Nance Pear, MD 09/01/18 1346

## 2018-08-31 NOTE — Progress Notes (Signed)
eLink Physician-Brief Progress Note Patient Name: Gabriella Wiggins DOB: 30-Dec-1940 MRN: [id number]   Date of Service  08/31/2018  HPI/Events of Note  Pt admitted with COPD exacerbation and acute on chronic respiratory failure. She has also had elevated troponin likely secondary to demand ischemia.  eICU Interventions  Continue current Rx for COPD exacerbation, cycle cardiac enzymes        Frederik Pear 08/31/2018, 10:53 PM

## 2018-08-31 NOTE — ED Notes (Signed)
Floor unable to take report at this time.

## 2018-08-31 NOTE — H&P (Signed)
[facility] [place] NAME: Gabriella Wiggins    MR#:  [id number]  DATE OF BIRTH:  Sep 17, 1941  DATE OF ADMISSION:  08/31/2018  PRIMARY CARE PHYSICIAN: Juline Patch, MD   REQUESTING/REFERRING PHYSICIAN: Dr. Nance Pear  CHIEF COMPLAINT:   Chief Complaint  Patient presents with  . Leg Swelling  . Shortness of Breath    HISTORY OF PRESENT ILLNESS:  Gabriella Wiggins  is a 77 y.o. female with a known history of COPD with ongoing tobacco abuse, history of breast cancer, who presents to the hospital due to worsening shortness of breath and lower extremity edema.  Patient says that she has had significant swelling in her lower extremity progressively getting worse over the past 2 months and also worsening exertional dyspnea.  She has a long-standing history of tobacco abuse but currently is not on oxygen at home.  She has no history of congestive heart failure or any previous history of heart disease.  She is currently not on diuretics.  She denies any paroxysmal nocturnal dyspnea orthopnea.  She presents to the ER was noted to be significantly hypoxic with O2 sats in the mid 70s on room air and noted to be in acute respiratory failure with hypoxia likely secondary to COPD exacerbation.  Hospitalist services were contacted for admission.  Incidentally patient was also noted to be in acute kidney injury with mild hyperkalemia and elevated troponin.  Patient does admit to a cough which is productive with gray-colored sputum but denies any hemoptysis, fever chills, nausea vomiting, chest pain, abdominal pain or any other associated symptoms presently.  PAST MEDICAL HISTORY:   Past Medical History:  Diagnosis Date  . Aortic atherosclerosis ([place])   . Breast cancer ([place]) 2004   left lumpectomy  . COPD (chronic obstructive pulmonary disease) ([place])   . Personal history of radiation therapy   . Post-menopause bleeding     PAST SURGICAL HISTORY:   Past  Surgical History:  Procedure Laterality Date  . APPENDECTOMY    . bilateral tubal    . BREAST BIOPSY Right    milk duct removed -neg  . BREAST BIOPSY Left 2004   lumpectomy/rad  . BREAST LUMPECTOMY Left   . DILATATION & CURETTAGE/HYSTEROSCOPY WITH MYOSURE N/A 06/20/2017   Procedure: DILATATION & CURETTAGE/HYSTEROSCOPY WITH MYOSURE;  Surgeon: Schermerhorn, Gwen Her, MD;  Location: [facility] ORS;  Service: Gynecology;  Laterality: N/A;  . HYSTEROSCOPY W/D&C N/A 06/20/2017   Procedure: DILATATION AND CURETTAGE /HYSTEROSCOPY;  Surgeon: Schermerhorn, Gwen Her, MD;  Location: [facility] ORS;  Service: Gynecology;  Laterality: N/A;  . OVARIAN CYST SURGERY      SOCIAL HISTORY:   Social History   Tobacco Use  . Smoking status: Current Every Day Smoker    Packs/day: 0.50    Years: 50.00    Pack years: 25.00    Types: Cigarettes  . Smokeless tobacco: Never Used  . Tobacco comment: "down to 1 a day"  Substance Use Topics  . Alcohol use: No    Alcohol/week: 0.0 standard drinks    FAMILY HISTORY:   Family History  Problem Relation Age of Onset  . Heart disease Father   . Breast cancer Sister   . Diabetes Brother   . Breast cancer Maternal Aunt   . Breast cancer Paternal Aunt   . Breast cancer Cousin        cousins-both sides  . Cirrhosis Mother   . Bladder Cancer Sister   . Diabetes  Brother   . Parkinson's disease Brother   . Healthy Sister   . Heart disease Sister   . Dementia Sister   . Arthritis Sister   . Breast cancer Sister   . Healthy Brother   . Healthy Brother   . Arthritis Brother   . Colon cancer Cousin     DRUG ALLERGIES:   Allergies  Allergen Reactions  . Penicillin G Other (See Comments)  . Penicillins Hives    Has patient had a PCN reaction causing immediate rash, facial/tongue/throat swelling, SOB or lightheadedness with hypotension: No Has patient had a PCN reaction causing severe rash involving mucus membranes or skin necrosis: No Has patient had a PCN  reaction that required hospitalization: No Has patient had a PCN reaction occurring within the last 10 years: No If all of the above answers are "NO", then may proceed with Cephalosporin use.   Marland Kitchen Shrimp [Shellfish Allergy] Swelling    REVIEW OF SYSTEMS:   Review of Systems  Constitutional: Negative for fever and weight loss.  HENT: Negative for congestion, nosebleeds and tinnitus.   Eyes: Negative for blurred vision, double vision and redness.  Respiratory: Positive for cough, sputum production, shortness of breath and wheezing. Negative for hemoptysis.   Cardiovascular: Positive for leg swelling. Negative for chest pain, orthopnea and PND.  Gastrointestinal: Negative for abdominal pain, diarrhea, melena, nausea and vomiting.  Genitourinary: Negative for dysuria, hematuria and urgency.  Musculoskeletal: Negative for falls and joint pain.  Neurological: Negative for dizziness, tingling, sensory change, focal weakness, seizures, weakness and headaches.  Endo/Heme/Allergies: Negative for polydipsia. Does not bruise/bleed easily.  Psychiatric/Behavioral: Negative for depression and memory loss. The patient is not nervous/anxious.     MEDICATIONS AT HOME:   Prior to Admission medications   Medication Sig Start Date End Date Taking? Authorizing Provider  aspirin EC 81 MG tablet Take 81 mg by mouth daily.   Yes [provider]  buPROPion (WELLBUTRIN) 75 MG tablet Take 1 tablet (75 mg total) by mouth 2 (two) times daily. 06/06/18  Yes Juline Patch, MD  carboxymethylcellulose (REFRESH PLUS) 0.5 % SOLN Place 1 drop into both eyes 2 (two) times daily as needed (dry eyes).   Yes [provider]  cholecalciferol (VITAMIN D) 1000 units tablet Take 1 tablet (1,000 Units total) by mouth daily. 06/06/18  Yes Juline Patch, MD  Ipratropium-Albuterol (COMBIVENT RESPIMAT) 20-100 MCG/ACT AERS respimat Inhale 1 puff into the lungs 2 (two) times daily as needed for wheezing or shortness  of breath. 06/06/18  Yes Juline Patch, MD  OVER THE COUNTER MEDICATION Apply 1 application topically as needed (CBD Cream).   Yes [provider]  PROAIR HFA 108 (90 Base) MCG/ACT inhaler Inhale 1-2 puffs into the lungs every 6 (six) hours as needed. 06/06/18  Yes Juline Patch, MD  pyridOXINE (VITAMIN B-6) 100 MG tablet Take 200 mg by mouth daily.    Yes [provider]  Tiotropium Bromide-Olodaterol (STIOLTO RESPIMAT) 2.5-2.5 MCG/ACT AERS Inhale 1 Inhaler into the lungs 2 (two) times daily. 06/06/18  Yes Juline Patch, MD      VITAL SIGNS:  Blood pressure 120/62, pulse (!) 105, temperature (!) 97.5 F (36.4 C), temperature source Oral, resp. rate (!) 25, height 5\' 2"  (1.575 m), weight 38.1 kg, SpO2 96 %.  PHYSICAL EXAMINATION:  Physical Exam  GENERAL:  77 y.o.-year-old thin cachectic patient lying in bed in mild to moderate resp. Distress.  EYES: Pupils equal, round, reactive to light  and accommodation. No scleral icterus. Extraocular muscles intact.  HEENT: Head atraumatic, normocephalic. Oropharynx and nasopharynx clear. No oropharyngeal erythema, moist oral mucosa  NECK:  Supple, no jugular venous distention. No thyroid enlargement, no tenderness.  LUNGS: Lungs inspiratory and expiratory phase, diffuse rhonchi, no rales, wheezing, positive use of accessory muscles. CARDIOVASCULAR: S1, S2 RRR. No murmurs, rubs, gallops, clicks.  ABDOMEN: Soft, nontender, nondistended. Bowel sounds present. No organomegaly or mass.  EXTREMITIES: + 2-3 edema b/l, No cyanosis, or clubbing. + 2 pedal & radial pulses b/l.  B/l LE are cool to touch. NEUROLOGIC: Cranial nerves II through XII are intact. No focal Motor or sensory deficits appreciated b/l. Globally.  PSYCHIATRIC: The patient is alert and oriented x 3.  SKIN: No obvious rash, lesion, or ulcer.   LABORATORY PANEL:   CBC Recent Labs  Lab 08/31/18 1458  WBC 11.0*  HGB 16.7*  HCT 51.3*  PLT 163    ------------------------------------------------------------------------------------------------------------------  Chemistries  Recent Labs  Lab 08/31/18 1458  NA 136  K 6.1*  CL 96*  CO2 29  GLUCOSE 116*  BUN 99*  CREATININE 1.92*  CALCIUM 8.6*   ------------------------------------------------------------------------------------------------------------------  Cardiac Enzymes Recent Labs  Lab 08/31/18 1458  TROPONINI 0.89*   ------------------------------------------------------------------------------------------------------------------  RADIOLOGY:  Ct Angio Chest Pe W And/or Wo Contrast  Result Date: 08/31/2018 CLINICAL DATA:  Hypoxia and decreased peripheral pulses EXAM: CT ANGIOGRAPHY CHEST WITH CONTRAST TECHNIQUE: Multidetector CT imaging of the chest was performed using the standard protocol during bolus administration of intravenous contrast. Multiplanar CT image reconstructions and MIPs were obtained to evaluate the vascular anatomy. CONTRAST:  68mL ISOVUE-370 IOPAMIDOL (ISOVUE-370) INJECTION 76% COMPARISON:  06/06/2018 chest x-ray FINDINGS: Cardiovascular: Thoracic aorta again demonstrates diffuse atherosclerotic calcifications without aneurysmal dilatation or signs of dissection. No cardiac enlargement is seen. Coronary calcifications are noted. The pulmonary artery shows a normal branching pattern. No filling defect to suggest pulmonary embolism is identified. Mediastinum/Nodes: The thoracic inlet is within normal limits. No significant hilar or mediastinal adenopathy is noted. The esophagus as visualized is within normal limits. Lungs/Pleura: Diffuse emphysematous changes are noted similar to that seen on prior plain film exam. Patchy atelectatic changes are noted in the bases bilaterally. No focal confluent infiltrate or sizable effusion is seen. Mild fibrotic changes are noted as well particularly on the left. Some areas of mucous plugging are noted in the left lower  lobe. Upper Abdomen: Paucity of peritoneal fat is noted. No definitive acute abnormality is seen. Musculoskeletal: Chronic compression deformity at T3 is noted stable from the previous exam. Schmorl's node is noted superiorly at T10. Review of the MIP images confirms the above findings. IMPRESSION: No evidence of pulmonary emboli. COPD with fibrotic changes and mild basilar atelectasis. No acute infiltrate is seen. Some areas of mucous plugging are noted on the left lower lobe. Chronic compression deformity at T3. Aortic Atherosclerosis (ICD10-I70.0) and Emphysema (ICD10-J43.9). Electronically Signed   By: Inez Catalina M.D.   On: 08/31/2018 15:40   Ct Angio Ao+bifem W & Or Wo Contrast  Result Date: 08/31/2018 CLINICAL DATA:  77 year old with low oxygen saturations and non palpable pulses in lower extremity. Bilateral lower extremity edema. EXAM: CT ANGIOGRAPHY OF ABDOMINAL AORTA WITH ILIOFEMORAL RUNOFF TECHNIQUE: Multidetector CT imaging of the abdomen, pelvis and lower extremities was performed using the standard protocol during bolus administration of intravenous contrast. Multiplanar CT image reconstructions and MIPs were obtained to evaluate the vascular anatomy. CONTRAST:  62mL ISOVUE-370 IOPAMIDOL (ISOVUE-370) INJECTION 76% COMPARISON:  Chest CT 8  08/31/2018 FINDINGS: VASCULAR Aorta: Diffuse atherosclerotic disease in the abdominal aorta without dissection or aneurysm. Celiac: Celiac trunk is patent. There appears to be variant anatomy with a replaced common hepatic artery from the SMA. SMA: SMA and main branches are patent. Renals: Atherosclerotic disease at the origin left renal artery without significant stenosis. Irregularity and stenosis at the origin of the right renal artery. IMA: IMA is patent. RIGHT Lower Extremity Inflow: Atherosclerotic disease in the right iliac arteries without significant stenosis. Outflow: Common, superficial and profunda femoral arteries and the popliteal artery are patent  without evidence of aneurysm, dissection, vasculitis or significant stenosis. Runoff: 2 vessel runoff in the right lower extremity. The anterior tibial artery occludes in the mid calf. Posterior tibial artery appears to be patent at the ankle but limited evaluation because of the timing of the study. Limited evaluation of the arteries in the foot. LEFT Lower Extremity Inflow: Atherosclerotic calcifications of iliac arteries without significant stenosis. Outflow: Common, superficial and profunda femoral arteries and the popliteal artery are patent without evidence of aneurysm, dissection, vasculitis or significant stenosis. Runoff: Three-vessel runoff in the left lower extremity. Limited evaluation of the ankle arteries due to the timing of the study. Veins: Limited evaluation of the veins due to the arterial phase of contrast. Review of the MIP images confirms the above findings. NON-VASCULAR Lower chest: Severe centrilobular emphysema. Patchy peripheral densities at the left lung base are nonspecific but could be related atelectasis or scarring. Refer to the recent chest CTA for evaluation of the lungs. No large pleural effusions. Hepatobiliary: Liver has unusual configuration and extends low into the midline. Gallbladder appears to be on sequence 5, image 60 but the liver and biliary system is poorly characterized on this examination. Pancreas: Limited evaluation of the pancreas. Spleen: Limited evaluation of the spleen due to the arterial phase of enhancement. No splenomegaly. Adrenals/Urinary Tract: Limited evaluation of the adrenal glands. No gross abnormality or hydronephrosis involving the kidneys. Fluid in the urinary bladder. Stomach/Bowel: Large amount of stool in the rectum. Limited evaluation of the bowel structures due to the lack of intra-abdominal fat and lack of oral contrast. Lymphatic: No significant lymph node enlargement in the abdomen or pelvis but limited evaluation. Reproductive: Uterus  appears to be present but very limited evaluation. Limited evaluation of the adnexa and ovaries. Other: No free fluid.  Negative for free air. Musculoskeletal: Dextroscoliosis in the lumbar spine. Diffuse subcutaneous edema in the lower extremities particularly below the knees. IMPRESSION: VASCULAR 1. No significant occlusive disease in the abdomen, pelvis or lower extremities. 2. Two vessel runoff disease in the right lower extremity and three vessel runoff disease in left lower extremity. No significant contrast in the ankles or feet but this is probably related to the timing of the study. 3. 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 Stenosis is moderate in severity. NON-VASCULAR 1. Severe centrilobular emphysema. Patchy densities at the lung bases are nonspecific, refer to the recent chest CT for additional evaluation. 2. Bilateral lower extremity edema. 3. Limited evaluation of the intra-abdominal structures. 4. Aortic Atherosclerosis (ICD10-I70.0) and Emphysema (ICD10-J43.9). Electronically Signed   By: Markus Daft M.D.   On: 08/31/2018 15:57     IMPRESSION AND PLAN:   77 year old female with past medical history of COPD, ongoing tobacco abuse, history of breast cancer, anxiety who presents to the hospital due to worsening lower extremity edema and shortness of breath.  1.  Acute respiratory failure with hypoxia-secondary to COPD exacerbation. - Patient's CT chest  with contrast showed no pulmonary embolism but evidence of severe centrilobular emphysema with scarring and fibrosis. - Continue BiPAP support, will treat underlying COPD with IV steroids, scheduled duo nebs, Pulmicort nebs.  2.  COPD exacerbation-secondary to ongoing tobacco abuse with possible underlying bronchitis. - We will treat patient with IV steroids, scheduled duo nebs, Pulmicort nebs.  We will also give the patient empiric antibiotics with Levaquin. - Follow clinically.  3.  Lower extremity edema-etiology unclear but  suspected secondary to right-sided heart failure and cor pulmonale. - We will check echocardiogram, hold off on further diuretics at this time.  4.  Elevated troponin-likely in setting of supply demand ischemia from hypoxemia and likely right-sided heart failure from severe COPD. - We will cycle cardiac markers, check echocardiogram, ER has empirically started the patient heparin drip.  Await further cardiology input. - Cardiology consult placed with [facility].   5.  Acute kidney injury with hyperkalemia-secondary to dehydration. - We will give 1 dose of Veltassa, gently hydrate the patient with IV fluids, follow BUN and creatinine.  Renal dose meds, avoid nephrotoxins.  6.  Anxiety-continue Wellbutrin.    All the records are reviewed and case discussed with ED provider. Management plans discussed with the patient, family and they are in agreement.  CODE STATUS: DNR  TOTAL Critical Care TIME TAKING CARE OF THIS PATIENT: 45 minutes.    Henreitta Leber M.D on 08/31/2018 at 5:12 PM  Between 7am to 6pm - Pager - [phone]  After 6pm go to [email or web address] - password [email or web address] [facility]  Office  [phone]  CC: Primary care physician; Juline Patch, MD

## 2018-08-31 NOTE — Progress Notes (Signed)
Transported pt to ccu from ED on [facility] without incident.

## 2018-08-31 NOTE — Consult Note (Signed)
Pharmacy Antibiotic Note  Gabriella Wiggins is a 77 y.o. female admitted on 08/31/2018 with pneumonia.  PMH of COPD. Pharmacy has been consulted for Levofloxacin dosing.  Plan: Ordered Levofloxacin 750 mg IV x 1, followed by 500 mg q48h.  Height: 5\' 2"  (157.5 cm) Weight: 84 lb (38.1 kg) IBW/kg (Calculated) : 50.1  Temp (24hrs), Avg:97.7 F (36.5 C), Min:97.5 F (36.4 C), Max:97.8 F (36.6 C)  Recent Labs  Lab 08/31/18 1458  WBC 11.0*  CREATININE 1.92*    Estimated Creatinine Clearance: 15.2 mL/min (A) (by C-G formula based on SCr of 1.92 mg/dL (H)).    Allergies  Allergen Reactions  . Penicillin G Other (See Comments)  . Penicillins Hives    Has patient had a PCN reaction causing immediate rash, facial/tongue/throat swelling, SOB or lightheadedness with hypotension: No Has patient had a PCN reaction causing severe rash involving mucus membranes or skin necrosis: No Has patient had a PCN reaction that required hospitalization: No Has patient had a PCN reaction occurring within the last 10 years: No If all of the above answers are "NO", then may proceed with Cephalosporin use.   . Shrimp [Shellfish Allergy] Swelling    Antimicrobials this admission: 12/5 Levofloxacin >>    Dose adjustments this admission: N/A  Microbiology results:   Thank you for allowing pharmacy to be a part of this patient's care.   Paticia Stack, PharmD Pharmacy Resident  08/31/2018 5:25 PM

## 2018-08-31 NOTE — ED Notes (Signed)
MD Archie Balboa at bedside. No pulses found with doppler to bilateral lower extremities.

## 2018-08-31 NOTE — ED Notes (Signed)
Pt returned from CT at this time, escorted by this RN and RT.

## 2018-08-31 NOTE — ED Notes (Signed)
Pt wishes to be made DNR. Pt states she has a DNR already made. MD aware.

## 2018-08-31 NOTE — ED Triage Notes (Signed)
Patient c/o bilateral feet swelling that started in September.  Patient also c/o non productive cough since September. States she gets bronchitis once per year and she feels this is the same.

## 2018-08-31 NOTE — Consult Note (Signed)
ANTICOAGULATION CONSULT NOTE - Initial Consult  Pharmacy Consult for heparin drip Indication: chest pain/ACS  Allergies  Allergen Reactions  . Penicillin G Other (See Comments)  . Penicillins Hives    Has patient had a PCN reaction causing immediate rash, facial/tongue/throat swelling, SOB or lightheadedness with hypotension: No Has patient had a PCN reaction causing severe rash involving mucus membranes or skin necrosis: No Has patient had a PCN reaction that required hospitalization: No Has patient had a PCN reaction occurring within the last 10 years: No If all of the above answers are "NO", then may proceed with Cephalosporin use.   . Shrimp [Shellfish Allergy] Swelling    Patient Measurements: Height: 5\' 2"  (157.5 cm) Weight: 84 lb (38.1 kg) IBW/kg (Calculated) : 50.1 Heparin Dosing Weight: 38.1kg  Vital Signs: Temp: 97.5 F (36.4 C) (12/05 1459) Temp Source: Oral (12/05 1459) BP: 120/62 (12/05 1457) Pulse Rate: 105 (12/05 1457)  Labs: Recent Labs    08/31/18 1458  HGB 16.7*  HCT 51.3*  PLT 163  CREATININE 1.92*  TROPONINI 0.89*    Estimated Creatinine Clearance: 15.2 mL/min (A) (by C-G formula based on SCr of 1.92 mg/dL (H)).   Medical History: Past Medical History:  Diagnosis Date  . Aortic atherosclerosis ([place])   . Breast cancer ([place]) 2004   left lumpectomy  . COPD (chronic obstructive pulmonary disease) ([place])   . Personal history of radiation therapy   . Post-menopause bleeding     Medications:  Scheduled:  . heparin  2,250 Units Intravenous Once    Assessment: Patient is a 77 year old female who presents with hypoxia, LEE, non-palpable pulses to lower extremities. Found to have elevated troponin. Basline CBC WNL. Add on INR and APTT ordered. No anticoag PTA seen   Goal of Therapy:  Heparin level 0.3-0.7 units/ml Monitor platelets by anticoagulation protocol: Yes   Plan:  Give 2250 units bolus x 1 Start heparin infusion at 450  units/hr Check anti-Xa level in 8 hours and daily while on heparin Continue to monitor H&H and platelets  Reilynn Lauro D Zein Helbing, Pharm.D, BCPS Clinical Pharmacist 08/31/2018,4:28 PM

## 2018-08-31 NOTE — ED Notes (Signed)
Date and time results received: 08/31/18 3:48 PM  (use smartphrase ".now" to insert current time)  Test: Trop Critical Value: 0.89  Name of Provider Notified: Archie Balboa  Orders Received? Or Actions Taken?: Orders Received - See Orders for details

## 2018-09-01 ENCOUNTER — Inpatient Hospital Stay (HOSPITAL_COMMUNITY)
Admit: 2018-09-01 | Discharge: 2018-09-01 | Disposition: A | Discharge status: Home / Self Care | Payer: PPO | Attending: Specialist | Admitting: Specialist

## 2018-09-01 ENCOUNTER — Encounter: Payer: Self-pay | Admitting: Nurse Practitioner

## 2018-09-01 DIAGNOSIS — J432 Centrilobular emphysema: Secondary | ICD-10-CM

## 2018-09-01 DIAGNOSIS — I361 Nonrheumatic tricuspid (valve) insufficiency: Secondary | ICD-10-CM

## 2018-09-01 DIAGNOSIS — I25119 Atherosclerotic heart disease of native coronary artery with unspecified angina pectoris: Secondary | ICD-10-CM

## 2018-09-01 DIAGNOSIS — I272 Pulmonary hypertension, unspecified: Secondary | ICD-10-CM

## 2018-09-01 DIAGNOSIS — R609 Edema, unspecified: Secondary | ICD-10-CM

## 2018-09-01 DIAGNOSIS — J9601 Acute respiratory failure with hypoxia: Secondary | ICD-10-CM

## 2018-09-01 DIAGNOSIS — I248 Other forms of acute ischemic heart disease: Secondary | ICD-10-CM

## 2018-09-01 LAB — BASIC METABOLIC PANEL
Anion gap: 10 (ref 5–15)
BUN: 83 mg/dL — ABNORMAL HIGH (ref 8–23)
CO2: 33 mmol/L — AB (ref 22–32)
Calcium: 8.5 mg/dL — ABNORMAL LOW (ref 8.9–10.3)
Chloride: 99 mmol/L (ref 98–111)
Creatinine, Ser: 1.34 mg/dL — ABNORMAL HIGH (ref 0.44–1.00)
GFR calc Af Amer: 45 mL/min — ABNORMAL LOW (ref >60)
GFR calc non Af Amer: 39 mL/min — ABNORMAL LOW (ref >60)
Glucose, Bld: 99 mg/dL (ref 70–99)
Potassium: 4.5 mmol/L (ref 3.5–5.1)
Sodium: 142 mmol/L (ref 135–145)

## 2018-09-01 LAB — HEPARIN LEVEL (UNFRACTIONATED)
Heparin Unfractionated: <0.1 IU/mL — ABNORMAL LOW (ref 0.30–0.70)
Heparin Unfractionated: 0.15 IU/mL — ABNORMAL LOW (ref 0.30–0.70)

## 2018-09-01 LAB — CBC
HCT: 44.8 % (ref 36.0–46.0)
Hemoglobin: 14.7 g/dL (ref 12.0–15.0)
MCH: 32.9 pg (ref 26.0–34.0)
MCHC: 32.8 g/dL (ref 30.0–36.0)
MCV: 100.2 fL — AB (ref 80.0–100.0)
Platelets: 153 10*3/uL (ref 150–400)
RBC: 4.47 MIL/uL (ref 3.87–5.11)
RDW: 13 % (ref 11.5–15.5)
WBC: 11.9 10*3/uL — ABNORMAL HIGH (ref 4.0–10.5)
nRBC: 0.4 % — ABNORMAL HIGH (ref 0.0–0.2)

## 2018-09-01 LAB — LIPID PANEL
CHOLESTEROL: 150 mg/dL (ref 0–200)
HDL: 21 mg/dL — ABNORMAL LOW (ref >40)
LDL Cholesterol: 111 mg/dL — ABNORMAL HIGH (ref 0–99)
Total CHOL/HDL Ratio: 7.1 RATIO
Triglycerides: 91 mg/dL (ref <150)
VLDL: 18 mg/dL (ref 0–40)

## 2018-09-01 LAB — ECHOCARDIOGRAM COMPLETE
Height: 62 in
WEIGHTICAEL: 1343.99 [oz_av]

## 2018-09-01 LAB — TROPONIN I
Troponin I: 0.68 ng/mL (ref <0.03)
Troponin I: 0.66 ng/mL (ref <0.03)

## 2018-09-01 MED ORDER — ATORVASTATIN CALCIUM 20 MG PO TABS
40.0000 mg | ORAL_TABLET | Freq: Every day | ORAL | Status: DC
  Administered 2018-09-01 – 2018-09-05 (×5): 40 mg via ORAL
  Filled 2018-09-01 (×5): qty 2

## 2018-09-01 MED ORDER — VITAMIN C 500 MG PO TABS
250.0000 mg | ORAL_TABLET | Freq: Two times a day (BID) | ORAL | Status: DC
  Administered 2018-09-01 – 2018-09-05 (×9): 250 mg via ORAL
  Filled 2018-09-01 (×3): qty 1
  Filled 2018-09-01 (×2): qty 0.5
  Filled 2018-09-01 (×4): qty 1
  Filled 2018-09-01: qty 0.5

## 2018-09-01 MED ORDER — ENSURE ENLIVE PO LIQD
237.0000 mL | Freq: Two times a day (BID) | ORAL | Status: DC
  Administered 2018-09-01 – 2018-09-06 (×9): 237 mL via ORAL

## 2018-09-01 MED ORDER — HEPARIN BOLUS VIA INFUSION
1200.0000 [IU] | Freq: Once | INTRAVENOUS | Status: AC
  Administered 2018-09-01: 1200 [IU] via INTRAVENOUS
  Filled 2018-09-01: qty 1200

## 2018-09-01 MED ORDER — ADULT MULTIVITAMIN W/MINERALS CH
1.0000 | ORAL_TABLET | Freq: Every day | ORAL | Status: DC
  Administered 2018-09-01 – 2018-09-05 (×5): 1 via ORAL
  Filled 2018-09-01 (×5): qty 1

## 2018-09-01 MED ORDER — HEPARIN BOLUS VIA INFUSION
1100.0000 [IU] | Freq: Once | INTRAVENOUS | Status: AC
  Administered 2018-09-01: 1100 [IU] via INTRAVENOUS
  Filled 2018-09-01: qty 1100

## 2018-09-01 MED ORDER — HEPARIN BOLUS VIA INFUSION
1000.0000 [IU] | Freq: Once | INTRAVENOUS | Status: AC
  Administered 2018-09-01: 1000 [IU] via INTRAVENOUS
  Filled 2018-09-01: qty 1000

## 2018-09-01 NOTE — Consult Note (Signed)
ANTICOAGULATION CONSULT NOTE - Initial Consult  Pharmacy Consult for heparin drip Indication: chest pain/ACS  Allergies  Allergen Reactions  . Penicillin G Other (See Comments)  . Penicillins Hives    Has patient had a PCN reaction causing immediate rash, facial/tongue/throat swelling, SOB or lightheadedness with hypotension: No Has patient had a PCN reaction causing severe rash involving mucus membranes or skin necrosis: No Has patient had a PCN reaction that required hospitalization: No Has patient had a PCN reaction occurring within the last 10 years: No If all of the above answers are "NO", then may proceed with Cephalosporin use.   . Shrimp [Shellfish Allergy] Swelling    Patient Measurements: Height: 5\' 2"  (157.5 cm) Weight: 84 lb (38.1 kg) IBW/kg (Calculated) : 50.1 Heparin Dosing Weight: 38.1kg  Vital Signs: Temp: 99.4 F (37.4 C) (12/05 2148) BP: 111/65 (12/05 2000) Pulse Rate: 98 (12/06 0009)  Labs: Recent Labs    08/31/18 1458 08/31/18 2221 09/01/18 0219  HGB 16.7*  --  14.7  HCT 51.3*  --  44.8  PLT 163  --  153  APTT 30  --   --   LABPROT 15.8*  --   --   INR 1.27  --   --   HEPARINUNFRC  --   --  <0.10*  CREATININE 1.92*  --  1.34*  TROPONINI 0.89* 0.77* 0.68*    Estimated Creatinine Clearance: 21.8 mL/min (A) (by C-G formula based on SCr of 1.34 mg/dL (H)).   Medical History: Past Medical History:  Diagnosis Date  . Aortic atherosclerosis ([place])   . Breast cancer ([place]) 2004   left lumpectomy  . COPD (chronic obstructive pulmonary disease) ([place])   . Personal history of radiation therapy   . Post-menopause bleeding     Medications:  Scheduled:  . aspirin EC  81 mg Oral Daily  . budesonide (PULMICORT) nebulizer solution  0.5 mg Nebulization BID  . buPROPion  75 mg Oral BID  . cholecalciferol  1,000 Units Oral Daily  . heparin  1,000 Units Intravenous Once  . ipratropium-albuterol  3 mL Nebulization Q6H  . methylPREDNISolone (SOLU-MEDROL)  injection  40 mg Intravenous Q12H  . pyridOXINE  200 mg Oral Daily    Assessment: Patient is a 77 year old female who presents with hypoxia, LEE, non-palpable pulses to lower extremities. Found to have elevated troponin. Basline CBC WNL. Add on INR and APTT ordered. No anticoag PTA seen   Goal of Therapy:  Heparin level 0.3-0.7 units/ml Monitor platelets by anticoagulation protocol: Yes   Plan:  12/06 @ 0200 HL < 0.10 subtherapeutic. Will rebolus w/ heparin 1000 units IV x 1 and increase rate to 600 units/hr and will recheck HL @ 1100, hgb trended down by 2 units, trops also trending down, plts WNL, will continue to monitor.  Tobie Lords, PharmD, BCPS Clinical Pharmacist 09/01/2018

## 2018-09-01 NOTE — Progress Notes (Signed)
Follow up - Critical Care Medicine Note  Patient Details:    Gabriella Wiggins is an 77 y.o. female.smoker with very advanced emphysema (but has never seen a pulmonologist) admitted via ED with severe hypoxemic respiratory failure, severe lower extremity edema and profound cachexia. Admitting diagnosis COPD exacerbation  Lines, Airways, Drains: External Urinary Catheter (Active)  Collection Container Dedicated Suction Canister 09/01/2018  3:30 AM    Anti-infectives:  Anti-infectives (From admission, onward)   Start     Dose/Rate Route Frequency Ordered Stop   09/02/18 1800  levofloxacin (LEVAQUIN) IVPB 500 mg     500 mg 100 mL/hr over 60 Minutes Intravenous Every 48 hours 08/31/18 1724     08/31/18 1800  levofloxacin (LEVAQUIN) IVPB 750 mg     750 mg 100 mL/hr over 90 Minutes Intravenous  Once 08/31/18 1724 08/31/18 2327      Microbiology: Results for orders placed or performed during the hospital encounter of 08/31/18  MRSA PCR Screening     Status: None   Collection Time: 08/31/18 10:03 PM  Result Value Ref Range Status   MRSA by PCR NEGATIVE NEGATIVE Final    Comment:        The GeneXpert MRSA Assay (FDA approved for NASAL specimens only), is one component of a comprehensive MRSA colonization surveillance program. It is not intended to diagnose MRSA infection nor to guide or monitor treatment for MRSA infections. Performed at [facility], [address] [place], [place] [address]     Studies: Ct Angio Chest Pe W And/or Wo Contrast  Result Date: 08/31/2018 CLINICAL DATA:  Hypoxia and decreased peripheral pulses EXAM: CT ANGIOGRAPHY CHEST WITH CONTRAST TECHNIQUE: Multidetector CT imaging of the chest was performed using the standard protocol during bolus administration of intravenous contrast. Multiplanar CT image reconstructions and MIPs were obtained to evaluate the vascular anatomy. CONTRAST:  68mL ISOVUE-370 IOPAMIDOL (ISOVUE-370) INJECTION 76% COMPARISON:   06/06/2018 chest x-ray FINDINGS: Cardiovascular: Thoracic aorta again demonstrates diffuse atherosclerotic calcifications without aneurysmal dilatation or signs of dissection. No cardiac enlargement is seen. Coronary calcifications are noted. The pulmonary artery shows a normal branching pattern. No filling defect to suggest pulmonary embolism is identified. Mediastinum/Nodes: The thoracic inlet is within normal limits. No significant hilar or mediastinal adenopathy is noted. The esophagus as visualized is within normal limits. Lungs/Pleura: Diffuse emphysematous changes are noted similar to that seen on prior plain film exam. Patchy atelectatic changes are noted in the bases bilaterally. No focal confluent infiltrate or sizable effusion is seen. Mild fibrotic changes are noted as well particularly on the left. Some areas of mucous plugging are noted in the left lower lobe. Upper Abdomen: Paucity of peritoneal fat is noted. No definitive acute abnormality is seen. Musculoskeletal: Chronic compression deformity at T3 is noted stable from the previous exam. Schmorl's node is noted superiorly at T10. Review of the MIP images confirms the above findings. IMPRESSION: No evidence of pulmonary emboli. COPD with fibrotic changes and mild basilar atelectasis. No acute infiltrate is seen. Some areas of mucous plugging are noted on the left lower lobe. Chronic compression deformity at T3. Aortic Atherosclerosis (ICD10-I70.0) and Emphysema (ICD10-J43.9). Electronically Signed   By: Inez Catalina M.D.   On: 08/31/2018 15:40   Ct Angio Ao+bifem W & Or Wo Contrast  Result Date: 08/31/2018 CLINICAL DATA:  77 year old with low oxygen saturations and non palpable pulses in lower extremity. Bilateral lower extremity edema. EXAM: CT ANGIOGRAPHY OF ABDOMINAL AORTA WITH ILIOFEMORAL RUNOFF TECHNIQUE: Multidetector CT imaging of the  abdomen, pelvis and lower extremities was performed using the standard protocol during bolus  administration of intravenous contrast. Multiplanar CT image reconstructions and MIPs were obtained to evaluate the vascular anatomy. CONTRAST:  21mL ISOVUE-370 IOPAMIDOL (ISOVUE-370) INJECTION 76% COMPARISON:  Chest CT 8 08/31/2018 FINDINGS: VASCULAR Aorta: Diffuse atherosclerotic disease in the abdominal aorta without dissection or aneurysm. Celiac: Celiac trunk is patent. There appears to be variant anatomy with a replaced common hepatic artery from the SMA. SMA: SMA and main branches are patent. Renals: Atherosclerotic disease at the origin left renal artery without significant stenosis. Irregularity and stenosis at the origin of the right renal artery. IMA: IMA is patent. RIGHT Lower Extremity Inflow: Atherosclerotic disease in the right iliac arteries without significant stenosis. Outflow: Common, superficial and profunda femoral arteries and the popliteal artery are patent without evidence of aneurysm, dissection, vasculitis or significant stenosis. Runoff: 2 vessel runoff in the right lower extremity. The anterior tibial artery occludes in the mid calf. Posterior tibial artery appears to be patent at the ankle but limited evaluation because of the timing of the study. Limited evaluation of the arteries in the foot. LEFT Lower Extremity Inflow: Atherosclerotic calcifications of iliac arteries without significant stenosis. Outflow: Common, superficial and profunda femoral arteries and the popliteal artery are patent without evidence of aneurysm, dissection, vasculitis or significant stenosis. Runoff: Three-vessel runoff in the left lower extremity. Limited evaluation of the ankle arteries due to the timing of the study. Veins: Limited evaluation of the veins due to the arterial phase of contrast. Review of the MIP images confirms the above findings. NON-VASCULAR Lower chest: Severe centrilobular emphysema. Patchy peripheral densities at the left lung base are nonspecific but could be related atelectasis or  scarring. Refer to the recent chest CTA for evaluation of the lungs. No large pleural effusions. Hepatobiliary: Liver has unusual configuration and extends low into the midline. Gallbladder appears to be on sequence 5, image 60 but the liver and biliary system is poorly characterized on this examination. Pancreas: Limited evaluation of the pancreas. Spleen: Limited evaluation of the spleen due to the arterial phase of enhancement. No splenomegaly. Adrenals/Urinary Tract: Limited evaluation of the adrenal glands. No gross abnormality or hydronephrosis involving the kidneys. Fluid in the urinary bladder. Stomach/Bowel: Large amount of stool in the rectum. Limited evaluation of the bowel structures due to the lack of intra-abdominal fat and lack of oral contrast. Lymphatic: No significant lymph node enlargement in the abdomen or pelvis but limited evaluation. Reproductive: Uterus appears to be present but very limited evaluation. Limited evaluation of the adnexa and ovaries. Other: No free fluid.  Negative for free air. Musculoskeletal: Dextroscoliosis in the lumbar spine. Diffuse subcutaneous edema in the lower extremities particularly below the knees. IMPRESSION: VASCULAR 1. No significant occlusive disease in the abdomen, pelvis or lower extremities. 2. Two vessel runoff disease in the right lower extremity and three vessel runoff disease in left lower extremity. No significant contrast in the ankles or feet but this is probably related to the timing of the study. 3. 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 Stenosis is moderate in severity. NON-VASCULAR 1. Severe centrilobular emphysema. Patchy densities at the lung bases are nonspecific, refer to the recent chest CT for additional evaluation. 2. Bilateral lower extremity edema. 3. Limited evaluation of the intra-abdominal structures. 4. Aortic Atherosclerosis (ICD10-I70.0) and Emphysema (ICD10-J43.9). Electronically Signed   By: Markus Daft M.D.   On: 08/31/2018  15:57    Consults: Treatment Team:  Minna Merritts, MD  Subjective:    Overnight Issues: No significant issues overnight.  Patient on BiPAP, albuterol, Atrovent, Solu-Medrol, Levaquin and budesonide.  Is empirically on heparin and cardiology consultation with troponin peak at 8 9  Objective:  Vital signs for last 24 hours: Temp:  [97.5 F (36.4 C)-99.4 F (37.4 C)] 98.3 F (36.8 C) (12/06 0400) Pulse Rate:  [92-108] 94 (12/06 0600) Resp:  [17-31] 22 (12/06 0600) BP: (102-136)/(48-78) 109/56 (12/06 0600) SpO2:  [74 %-100 %] 100 % (12/06 0600) FiO2 (%):  [28 %] 28 % (12/06 0158) Weight:  [38.1 kg] 38.1 kg (12/05 1450)  Hemodynamic parameters for last 24 hours:    Intake/Output from previous day: No intake/output data recorded.  Intake/Output this shift: No intake/output data recorded.  Vent settings for last 24 hours: FiO2 (%):  [28 %] 28 %  Physical Exam:  General: Profoundly cachectic, adequately supported on BiPAP, LOC normal, cognition appears intact Neuro: CNs intact, moves all extremities, DTRs symmetric HEENT: Temporal wasting, NCAT, sclerae white Cardiovascular: Regular, no M noted Lungs: Hyperresonant to percussion, markedly diminished breath sounds throughout, scattered rhonchi, no wheezes Abdomen: Scaphoid, soft, NT, + BS Extremities: 3+ symmetric pretibial ankle and pedal edema, violaceous digits with diminished pulses but normal cap refill   Assessment/Plan:   Hypoxemic respiratory failure.  Patient with advanced emphysema noted on CT scan of chest.  Presently on appropriate therapeutics to include Solu-Medrol, bronchodilators and empiric Levaquin and BiPAP.  Patient has severe cachexia most likely related to emphysema along with probable cor pulmonale.  Elevated troponin.  Patient has been started on heparin and cardiology has been consulted pending their recommendations.  Elevated BNP.  May reflect cor pulmonale.  Pending echocardiography  Renal  insufficiency.  Predominantly prerenal with a BUN 83/creatinine 1.34.  Will monitor closely.  CO2 is 33 most likely compensatory for hypercapnic respiratory failure, potassium stable at 4.5  Arterial vascular disease.  Please see results of CT angiogram yesterday  Mild leukocytosis at 11.9.  On Levaquin  Critical care time 30 minutes, respiratory failure  Christinea Brizuela 09/01/2018  *Care during the described time interval was provided by me and/or other providers on the critical care team.  I have reviewed this patient's available data, including medical history, events of note, physical examination and test results as part of my evaluation.

## 2018-09-01 NOTE — Care Management Note (Signed)
Case Management Note  Patient Details  Name: Kelie Gainey MRN: [id number] Date of Birth: Oct 27, 1940  Subjective/Objective:   Patient is admitted with acute respiratory failure with hypoxemia.  Patient is from home, she lives with her husband in [place].  PCP verified as Dr. Otilio Miu, who she sees every 3 months.  Pharmacy is [facility]. Patient report's that she does not see any specialists and just over the past month has noticed trouble with her breathing.  She does not wear oxygen at home.  Patient reports she is independent in ADL's and has no equipment.  Patient reports that she drives.  RNCM will cont to follow to assess for discharge needs.  Doran Clay RN BSN Care Management [phone]                 Action/Plan:   Expected Discharge Date:  09/02/18               Expected Discharge Plan:  Home/Self Care  In-House Referral:     Discharge planning Services  CM Consult  Post Acute Care Choice:    Choice offered to:     DME Arranged:    DME Agency:     HH Arranged:    HH Agency:     Status of Service:  In process, will continue to follow  If discussed at Long Length of Stay Meetings, dates discussed:    Additional Comments:  Shelbie Hutching, RN 09/01/2018, 12:12 PM

## 2018-09-01 NOTE — Consult Note (Signed)
Cardiology Consult    Patient ID: Gabriella Wiggins MRN: [id number], DOB/AGE: [birth year]   Admit date: 08/31/2018 Date of Consult: 09/01/2018  Primary Physician: Juline Patch, MD Primary Cardiologist: Ida Rogue, MD Requesting Provider: Claria Dice, MD  Patient Profile    Gabriella Wiggins is a 77 y.o. female with a history of COPD, cachexia, breast cancer, and ongoing tobacco abuse, who is being seen today for the evaluation of troponin elevation in the setting of AECOPD at the request of Dr. Tressia Miners.  Past Medical History   Past Medical History:  Diagnosis Date  . Aortic atherosclerosis ([place])    a. 08/2018 noted on CT  . Breast cancer ([place]) 2004   left lumpectomy  . Cachexia ([place])   . COPD (chronic obstructive pulmonary disease) ([place])   . Coronary artery calcification seen on CT scan    a. 08/2018 seen on CT.  Marland Kitchen Personal history of radiation therapy   . Post-menopause bleeding   . Right renal artery stenosis ([place])    a. 08/2018 CTA Abd/Lower ext: Moderate R RA stenosis @ origin.  . Tobacco abuse    a. at least 52 y pack history.    Past Surgical History:  Procedure Laterality Date  . APPENDECTOMY    . bilateral tubal    . BREAST BIOPSY Right    milk duct removed -neg  . BREAST BIOPSY Left 2004   lumpectomy/rad  . BREAST LUMPECTOMY Left   . DILATATION & CURETTAGE/HYSTEROSCOPY WITH MYOSURE N/A 06/20/2017   Procedure: DILATATION & CURETTAGE/HYSTEROSCOPY WITH MYOSURE;  Surgeon: Schermerhorn, Gwen Her, MD;  Location: [facility] ORS;  Service: Gynecology;  Laterality: N/A;  . HYSTEROSCOPY W/D&C N/A 06/20/2017   Procedure: DILATATION AND CURETTAGE /HYSTEROSCOPY;  Surgeon: Schermerhorn, Gwen Her, MD;  Location: [facility] ORS;  Service: Gynecology;  Laterality: N/A;  . OVARIAN CYST SURGERY       Allergies  Allergies  Allergen Reactions  . Penicillin G Other (See Comments)  . Penicillins Hives    Has patient had a PCN reaction causing immediate rash, facial/tongue/throat swelling,  SOB or lightheadedness with hypotension: No Has patient had a PCN reaction causing severe rash involving mucus membranes or skin necrosis: No Has patient had a PCN reaction that required hospitalization: No Has patient had a PCN reaction occurring within the last 10 years: No If all of the above answers are "NO", then may proceed with Cephalosporin use.   Marland Kitchen Shrimp [Shellfish Allergy] Swelling    History of Present Illness    77 y/o ? with the above PMH including ongong tob abuse (55 pack yr history - currently smoking 1ppd), COPD, cachexia (83 lbs), and breast cancer.  She has no prior cardiac history, though thinks she had a stress test at some point many years ago.  She has some degree of chronic DOE but over the past 3 months, this has worsened.  Over the past few days, she has noted bilateral ankle and pedal edema and also worsening dyspnea.  No chest pain/pressure.  She presented to Urgent Care on 12/5, where she was found to be hypoxic with O2 sats in the mid 70's on RA.  She was transferred via EMS to the ED for further eval.  ECG non-acute.  Trop initially 0.89.  Placed on BiPAP.  Pedal pulses were difficult to palpate and so CTA of the chest, Ao+bifem was performed.  No PE, or LE occlusive dzs noted.  Ostrial right renal artery stenosis was noted along with coronary and aortic Ca2+/atherosclerosis.  CT also showed severe centrilobular emphysema.  Of noted, Creast 1.92 w/ K of 6.1 on arrival.  Treated with Veltassa.  BNP 2511.  She was seen by medicine and admitted to ICU where she remains on BiPAP - currently sat'ing @ 100%.  In the absence of c/p, trops have trended down - 0.77  0.68  0.66.  We've been asked to eval.  She denies any current or prior h/o c/p.  Inpatient Medications    . aspirin EC  81 mg Oral Daily  . budesonide (PULMICORT) nebulizer solution  0.5 mg Nebulization BID  . buPROPion  75 mg Oral BID  . cholecalciferol  1,000 Units Oral Daily  . ipratropium-albuterol  3 mL  Nebulization Q6H  . methylPREDNISolone (SOLU-MEDROL) injection  40 mg Intravenous Q12H  . pyridOXINE  200 mg Oral Daily    Family History    Family History  Problem Relation Age of Onset  . Heart disease Father   . Breast cancer Sister   . Diabetes Brother   . Breast cancer Maternal Aunt   . Breast cancer Paternal Aunt   . Breast cancer Cousin        cousins-both sides  . Cirrhosis Mother   . Bladder Cancer Sister   . Diabetes Brother   . Parkinson's disease Brother   . Healthy Sister   . Heart disease Sister   . Dementia Sister   . Arthritis Sister   . Breast cancer Sister   . Healthy Brother   . Healthy Brother   . Arthritis Brother   . Colon cancer Cousin    She indicated that her mother is deceased. She indicated that her father is deceased. She indicated that all of her seven sisters are alive. She indicated that two of her five brothers are alive. She indicated that her maternal aunt is deceased. She indicated that her paternal aunt is deceased. She indicated that both of her cousins are alive.   Social History    Social History   Socioeconomic History  . Marital status: Married    Spouse name: Not on file  . Number of children: 2  . Years of education: Not on file  . Highest education level: Not on file  Occupational History  . Occupation: retired  Scientific laboratory technician  . Financial resource strain: Not hard at all  . Food insecurity:    Worry: Never true    Inability: Never true  . Transportation needs:    Medical: No    Non-medical: No  Tobacco Use  . Smoking status: Current Every Day Smoker    Packs/day: 1.00    Years: 55.00    Pack years: 55.00    Types: Cigarettes  . Smokeless tobacco: Never Used  Substance and Sexual Activity  . Alcohol use: No    Alcohol/week: 0.0 standard drinks  . Drug use: No  . Sexual activity: Never  Lifestyle  . Physical activity:    Days per week: 0 days    Minutes per session: 0 min  . Stress: Not at all    Relationships  . Social connections:    Talks on phone: More than three times a week    Gets together: Three times a week    Attends religious service: 1 to 4 times per year    Active member of club or organization: Yes    Attends meetings of clubs or organizations: More than 4 times per year    Relationship status: Married  . Intimate  partner violence:    Fear of current or ex partner: No    Emotionally abused: No    Physically abused: No    Forced sexual activity: No  Other Topics Concern  . Not on file  Social History Narrative   Lives in [place] with her husband.  Does not routinely exercise.     Review of Systems    General:  No chills, fever, night sweats or weight changes.  Cardiovascular:  No chest pain, +++ dyspnea on exertion, +++ ankle/pedal edema over past few days, +++ orthopnea, no palpitations, paroxysmal nocturnal dyspnea. Dermatological: No rash, lesions/masses Respiratory: +++ cough, +++ dyspnea Urologic: No hematuria, dysuria Abdominal:   No nausea, vomiting, diarrhea, bright red blood per rectum, melena, or hematemesis Neurologic:  No visual changes, wkns, changes in mental status. All other systems reviewed and are otherwise negative except as noted above.  Physical Exam    Blood pressure (!) 109/56, pulse 94, temperature 98.3 F (36.8 C), temperature source Oral, resp. rate (!) 22, height 5\' 2"  (1.575 m), weight 38.1 kg, SpO2 100 %.  General: Pleasant, NAD but requiring BiPAP. Very frail. Psych: Normal affect. Neuro: Alert and oriented X 3. Moves all extremities spontaneously. HEENT: Normal  Neck: Supple without bruits or JVD. Lungs:  Resp regular and unlabored, markedly diminished breath sounds bilat. Heart: RRR, distant, no s3, s4, or murmurs. Abdomen: Soft, flat, non-tender, non-distended, BS + x 4.  Extremities: No clubbing, cyanosis. 1+ bilat ankle/pedal edema. DP/PT/Radials 1+ and equal bilaterally.  Labs     Recent Labs     08/31/18 1458 08/31/18 2221 09/01/18 0219 09/01/18 0557  TROPONINI 0.89* 0.77* 0.68* 0.66*   Lab Results  Component Value Date   WBC 11.9 (H) 09/01/2018   HGB 14.7 09/01/2018   HCT 44.8 09/01/2018   MCV 100.2 (H) 09/01/2018   PLT 153 09/01/2018    Recent Labs  Lab 09/01/18 0219  NA 142  K 4.5  CL 99  CO2 33*  BUN 83*  CREATININE 1.34*  CALCIUM 8.5*  GLUCOSE 99   Lab Results  Component Value Date   CHOL 244 (H) 10/08/2015   HDL 71 10/08/2015   LDLCALC 153 (H) 10/08/2015   TRIG 99 10/08/2015    Radiology Studies    Ct Angio Chest Pe W And/or Wo Contrast  Result Date: 08/31/2018 CLINICAL DATA:  Hypoxia and decreased peripheral pulses EXAM: CT ANGIOGRAPHY CHEST WITH CONTRAST TECHNIQUE: Multidetector CT imaging of the chest was performed using the standard protocol during bolus administration of intravenous contrast. Multiplanar CT image reconstructions and MIPs were obtained to evaluate the vascular anatomy. CONTRAST:  25mL ISOVUE-370 IOPAMIDOL (ISOVUE-370) INJECTION 76% COMPARISON:  06/06/2018 chest x-ray FINDINGS: Cardiovascular: Thoracic aorta again demonstrates diffuse atherosclerotic calcifications without aneurysmal dilatation or signs of dissection. No cardiac enlargement is seen. Coronary calcifications are noted. The pulmonary artery shows a normal branching pattern. No filling defect to suggest pulmonary embolism is identified. Mediastinum/Nodes: The thoracic inlet is within normal limits. No significant hilar or mediastinal adenopathy is noted. The esophagus as visualized is within normal limits. Lungs/Pleura: Diffuse emphysematous changes are noted similar to that seen on prior plain film exam. Patchy atelectatic changes are noted in the bases bilaterally. No focal confluent infiltrate or sizable effusion is seen. Mild fibrotic changes are noted as well particularly on the left. Some areas of mucous plugging are noted in the left lower lobe. Upper Abdomen: Paucity of  peritoneal fat is noted. No definitive acute abnormality is seen.  Musculoskeletal: Chronic compression deformity at T3 is noted stable from the previous exam. Schmorl's node is noted superiorly at T10. Review of the MIP images confirms the above findings. IMPRESSION: No evidence of pulmonary emboli. COPD with fibrotic changes and mild basilar atelectasis. No acute infiltrate is seen. Some areas of mucous plugging are noted on the left lower lobe. Chronic compression deformity at T3. Aortic Atherosclerosis (ICD10-I70.0) and Emphysema (ICD10-J43.9). Electronically Signed   By: Inez Catalina M.D.   On: 08/31/2018 15:40   Ct Angio Ao+bifem W & Or Wo Contrast  Result Date: 08/31/2018 CLINICAL DATA:  77 year old with low oxygen saturations and non palpable pulses in lower extremity. Bilateral lower extremity edema. EXAM: CT ANGIOGRAPHY OF ABDOMINAL AORTA WITH ILIOFEMORAL RUNOFF TECHNIQUE: Multidetector CT imaging of the abdomen, pelvis and lower extremities was performed using the standard protocol during bolus administration of intravenous contrast. Multiplanar CT image reconstructions and MIPs were obtained to evaluate the vascular anatomy. CONTRAST:  58mL ISOVUE-370 IOPAMIDOL (ISOVUE-370) INJECTION 76% COMPARISON:  Chest CT 8 08/31/2018 FINDINGS: VASCULAR Aorta: Diffuse atherosclerotic disease in the abdominal aorta without dissection or aneurysm. Celiac: Celiac trunk is patent. There appears to be variant anatomy with a replaced common hepatic artery from the SMA. SMA: SMA and main branches are patent. Renals: Atherosclerotic disease at the origin left renal artery without significant stenosis. Irregularity and stenosis at the origin of the right renal artery. IMA: IMA is patent. RIGHT Lower Extremity Inflow: Atherosclerotic disease in the right iliac arteries without significant stenosis. Outflow: Common, superficial and profunda femoral arteries and the popliteal artery are patent without evidence of aneurysm,  dissection, vasculitis or significant stenosis. Runoff: 2 vessel runoff in the right lower extremity. The anterior tibial artery occludes in the mid calf. Posterior tibial artery appears to be patent at the ankle but limited evaluation because of the timing of the study. Limited evaluation of the arteries in the foot. LEFT Lower Extremity Inflow: Atherosclerotic calcifications of iliac arteries without significant stenosis. Outflow: Common, superficial and profunda femoral arteries and the popliteal artery are patent without evidence of aneurysm, dissection, vasculitis or significant stenosis. Runoff: Three-vessel runoff in the left lower extremity. Limited evaluation of the ankle arteries due to the timing of the study. Veins: Limited evaluation of the veins due to the arterial phase of contrast. Review of the MIP images confirms the above findings. NON-VASCULAR Lower chest: Severe centrilobular emphysema. Patchy peripheral densities at the left lung base are nonspecific but could be related atelectasis or scarring. Refer to the recent chest CTA for evaluation of the lungs. No large pleural effusions. Hepatobiliary: Liver has unusual configuration and extends low into the midline. Gallbladder appears to be on sequence 5, image 60 but the liver and biliary system is poorly characterized on this examination. Pancreas: Limited evaluation of the pancreas. Spleen: Limited evaluation of the spleen due to the arterial phase of enhancement. No splenomegaly. Adrenals/Urinary Tract: Limited evaluation of the adrenal glands. No gross abnormality or hydronephrosis involving the kidneys. Fluid in the urinary bladder. Stomach/Bowel: Large amount of stool in the rectum. Limited evaluation of the bowel structures due to the lack of intra-abdominal fat and lack of oral contrast. Lymphatic: No significant lymph node enlargement in the abdomen or pelvis but limited evaluation. Reproductive: Uterus appears to be present but very  limited evaluation. Limited evaluation of the adnexa and ovaries. Other: No free fluid.  Negative for free air. Musculoskeletal: Dextroscoliosis in the lumbar spine. Diffuse subcutaneous edema in the lower extremities particularly below the  knees. IMPRESSION: VASCULAR 1. No significant occlusive disease in the abdomen, pelvis or lower extremities. 2. Two vessel runoff disease in the right lower extremity and three vessel runoff disease in left lower extremity. No significant contrast in the ankles or feet but this is probably related to the timing of the study. 3. 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 Stenosis is moderate in severity. NON-VASCULAR 1. Severe centrilobular emphysema. Patchy densities at the lung bases are nonspecific, refer to the recent chest CT for additional evaluation. 2. Bilateral lower extremity edema. 3. Limited evaluation of the intra-abdominal structures. 4. Aortic Atherosclerosis (ICD10-I70.0) and Emphysema (ICD10-J43.9). Electronically Signed   By: Markus Daft M.D.   On: 08/31/2018 15:57    ECG & Cardiac Imaging    Sinus tachycardia, 107, rightward axis, BAE, septal infarct, baseline artifact - personally reviewed.  Assessment & Plan    1.  AECOPD/Acute hypoxic resp failure:  Pt w/ long smoking hx and progressive DOE over the past three months, which worsened and became assoc w/ lower ext edema this week.  CTA in ED neg for PE but showed severe centrilobular emphysema.  BNP elevated but no pulm edema on CT. Low grade (99.4) fever overnight, but afebrile this AM.  WBC mildly elevated. Steroids/abx/nebs per IM/CCM.  2.  Elevated troponin/demand ischemia:  In setting of above, pt found to have mild trop elevation with downtrend since admission - 0.89  0.77  0.68  0.66.  She denies any prior or current h/o chest pain.  Agree w/ heparinization.  Will f/u echo to eval EF and wall motion abnormalities.  With severe cachexia, she is not an ideal candidate for cath, thus if EF wnl,  would pursue conservative mgmt w/ ASA/statin (LDL 153 in 09/2015). No  blocker in setting of #1 and soft BPs.  3.  Tob Abuse:  Cessation advised.  4.  AKI:  Creat 1.92 on arrival.  1.34 this AM despite contrast load w/ CT's yesterday.  Follow.  5.  Hyperkalemia:  Improved after veltassa yesterday.  6.  Lower ext edema:  3 day h/o increasing pedal/ankle edema. BNP 2511 but no obvious volume overload on exam.  Echo pending.  Agree w/ holding off on diuretics.  Check albumin in setting of cachexia (was nl in Sept).  Signed, Murray Hodgkins, NP 09/01/2018, 8:42 AM  For questions or updates, please contact   Please consult [email or web address] for contact info under Cardiology/STEMI.

## 2018-09-01 NOTE — Progress Notes (Signed)
[place] [facility] [place] NAME: Gabriella Wiggins    MR#:  [id number]  DATE OF BIRTH:  04/13/1941  SUBJECTIVE:  CHIEF COMPLAINT:   Chief Complaint  Patient presents with  . Leg Swelling  . Shortness of Breath   -Very malnourished patient, laying in bed, on BiPAP -States she is feeling better  REVIEW OF SYSTEMS:  Review of Systems  Constitutional: Positive for malaise/fatigue and weight loss. Negative for chills and fever.  HENT: Negative for congestion, ear discharge, hearing loss and nosebleeds.   Eyes: Negative for blurred vision and double vision.  Respiratory: Positive for shortness of breath. Negative for cough and wheezing.   Cardiovascular: Positive for leg swelling. Negative for chest pain and palpitations.  Gastrointestinal: Negative for abdominal pain, constipation, diarrhea, nausea and vomiting.  Genitourinary: Negative for dysuria.  Musculoskeletal: Negative for myalgias.  Neurological: Negative for dizziness, focal weakness, seizures, weakness and headaches.  Psychiatric/Behavioral: Negative for depression.    DRUG ALLERGIES:   Allergies  Allergen Reactions  . Penicillin G Other (See Comments)  . Penicillins Hives    Has patient had a PCN reaction causing immediate rash, facial/tongue/throat swelling, SOB or lightheadedness with hypotension: No Has patient had a PCN reaction causing severe rash involving mucus membranes or skin necrosis: No Has patient had a PCN reaction that required hospitalization: No Has patient had a PCN reaction occurring within the last 10 years: No If all of the above answers are "NO", then may proceed with Cephalosporin use.   . Shrimp [Shellfish Allergy] Swelling    VITALS:  Blood pressure (!) 117/103, pulse 99, temperature 98.2 F (36.8 C), temperature source Axillary, resp. rate 20, height 5\' 2"  (1.575 m), weight 38.1 kg, SpO2 100 %.  PHYSICAL EXAMINATION:  Physical Exam   GENERAL:  77  y.o.-year-old malnourished patient lying in the bed with no acute distress.  EYES: Pupils equal, round, reactive to light and accommodation. No scleral icterus. Extraocular muscles intact.  HEENT: Head atraumatic, normocephalic. Oropharynx and nasopharynx clear.  NECK:  Supple, no jugular venous distention. No thyroid enlargement, no tenderness.  LUNGS: Normal breath sounds bilaterally, no wheezing, rales,rhonchi or crepitation. No use of accessory muscles of respiration.  CARDIOVASCULAR: S1, S2 normal. No murmurs, rubs, or gallops.  ABDOMEN: Soft, nontender, nondistended. Bowel sounds present. No organomegaly or mass.  EXTREMITIES: No   cyanosis, or clubbing.  2+ pedal edema noted NEUROLOGIC: Cranial nerves II through XII are intact. Muscle strength 5/5 in all extremities. Sensation intact. Gait not checked.  PSYCHIATRIC: The patient is alert and oriented x 3.  SKIN: No obvious rash, lesion, or ulcer.    LABORATORY PANEL:   CBC Recent Labs  Lab 09/01/18 0219  WBC 11.9*  HGB 14.7  HCT 44.8  PLT 153   ------------------------------------------------------------------------------------------------------------------  Chemistries  Recent Labs  Lab 09/01/18 0219  NA 142  K 4.5  CL 99  CO2 33*  GLUCOSE 99  BUN 83*  CREATININE 1.34*  CALCIUM 8.5*   ------------------------------------------------------------------------------------------------------------------  Cardiac Enzymes Recent Labs  Lab 09/01/18 0557  TROPONINI 0.66*   ------------------------------------------------------------------------------------------------------------------  RADIOLOGY:  Ct Angio Chest Pe W And/or Wo Contrast  Result Date: 08/31/2018 CLINICAL DATA:  Hypoxia and decreased peripheral pulses EXAM: CT ANGIOGRAPHY CHEST WITH CONTRAST TECHNIQUE: Multidetector CT imaging of the chest was performed using the standard protocol during bolus administration of intravenous contrast. Multiplanar CT image  reconstructions and MIPs were obtained to evaluate the vascular anatomy. CONTRAST:  75mL ISOVUE-370  IOPAMIDOL (ISOVUE-370) INJECTION 76% COMPARISON:  06/06/2018 chest x-ray FINDINGS: Cardiovascular: Thoracic aorta again demonstrates diffuse atherosclerotic calcifications without aneurysmal dilatation or signs of dissection. No cardiac enlargement is seen. Coronary calcifications are noted. The pulmonary artery shows a normal branching pattern. No filling defect to suggest pulmonary embolism is identified. Mediastinum/Nodes: The thoracic inlet is within normal limits. No significant hilar or mediastinal adenopathy is noted. The esophagus as visualized is within normal limits. Lungs/Pleura: Diffuse emphysematous changes are noted similar to that seen on prior plain film exam. Patchy atelectatic changes are noted in the bases bilaterally. No focal confluent infiltrate or sizable effusion is seen. Mild fibrotic changes are noted as well particularly on the left. Some areas of mucous plugging are noted in the left lower lobe. Upper Abdomen: Paucity of peritoneal fat is noted. No definitive acute abnormality is seen. Musculoskeletal: Chronic compression deformity at T3 is noted stable from the previous exam. Schmorl's node is noted superiorly at T10. Review of the MIP images confirms the above findings. IMPRESSION: No evidence of pulmonary emboli. COPD with fibrotic changes and mild basilar atelectasis. No acute infiltrate is seen. Some areas of mucous plugging are noted on the left lower lobe. Chronic compression deformity at T3. Aortic Atherosclerosis (ICD10-I70.0) and Emphysema (ICD10-J43.9). Electronically Signed   By: Inez Catalina M.D.   On: 08/31/2018 15:40   Ct Angio Ao+bifem W & Or Wo Contrast  Result Date: 08/31/2018 CLINICAL DATA:  77 year old with low oxygen saturations and non palpable pulses in lower extremity. Bilateral lower extremity edema. EXAM: CT ANGIOGRAPHY OF ABDOMINAL AORTA WITH ILIOFEMORAL  RUNOFF TECHNIQUE: Multidetector CT imaging of the abdomen, pelvis and lower extremities was performed using the standard protocol during bolus administration of intravenous contrast. Multiplanar CT image reconstructions and MIPs were obtained to evaluate the vascular anatomy. CONTRAST:  24mL ISOVUE-370 IOPAMIDOL (ISOVUE-370) INJECTION 76% COMPARISON:  Chest CT 8 08/31/2018 FINDINGS: VASCULAR Aorta: Diffuse atherosclerotic disease in the abdominal aorta without dissection or aneurysm. Celiac: Celiac trunk is patent. There appears to be variant anatomy with a replaced common hepatic artery from the SMA. SMA: SMA and main branches are patent. Renals: Atherosclerotic disease at the origin left renal artery without significant stenosis. Irregularity and stenosis at the origin of the right renal artery. IMA: IMA is patent. RIGHT Lower Extremity Inflow: Atherosclerotic disease in the right iliac arteries without significant stenosis. Outflow: Common, superficial and profunda femoral arteries and the popliteal artery are patent without evidence of aneurysm, dissection, vasculitis or significant stenosis. Runoff: 2 vessel runoff in the right lower extremity. The anterior tibial artery occludes in the mid calf. Posterior tibial artery appears to be patent at the ankle but limited evaluation because of the timing of the study. Limited evaluation of the arteries in the foot. LEFT Lower Extremity Inflow: Atherosclerotic calcifications of iliac arteries without significant stenosis. Outflow: Common, superficial and profunda femoral arteries and the popliteal artery are patent without evidence of aneurysm, dissection, vasculitis or significant stenosis. Runoff: Three-vessel runoff in the left lower extremity. Limited evaluation of the ankle arteries due to the timing of the study. Veins: Limited evaluation of the veins due to the arterial phase of contrast. Review of the MIP images confirms the above findings. NON-VASCULAR Lower  chest: Severe centrilobular emphysema. Patchy peripheral densities at the left lung base are nonspecific but could be related atelectasis or scarring. Refer to the recent chest CTA for evaluation of the lungs. No large pleural effusions. Hepatobiliary: Liver has unusual configuration and extends low into the midline.  Gallbladder appears to be on sequence 5, image 60 but the liver and biliary system is poorly characterized on this examination. Pancreas: Limited evaluation of the pancreas. Spleen: Limited evaluation of the spleen due to the arterial phase of enhancement. No splenomegaly. Adrenals/Urinary Tract: Limited evaluation of the adrenal glands. No gross abnormality or hydronephrosis involving the kidneys. Fluid in the urinary bladder. Stomach/Bowel: Large amount of stool in the rectum. Limited evaluation of the bowel structures due to the lack of intra-abdominal fat and lack of oral contrast. Lymphatic: No significant lymph node enlargement in the abdomen or pelvis but limited evaluation. Reproductive: Uterus appears to be present but very limited evaluation. Limited evaluation of the adnexa and ovaries. Other: No free fluid.  Negative for free air. Musculoskeletal: Dextroscoliosis in the lumbar spine. Diffuse subcutaneous edema in the lower extremities particularly below the knees. IMPRESSION: VASCULAR 1. No significant occlusive disease in the abdomen, pelvis or lower extremities. 2. Two vessel runoff disease in the right lower extremity and three vessel runoff disease in left lower extremity. No significant contrast in the ankles or feet but this is probably related to the timing of the study. 3. 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 Stenosis is moderate in severity. NON-VASCULAR 1. Severe centrilobular emphysema. Patchy densities at the lung bases are nonspecific, refer to the recent chest CT for additional evaluation. 2. Bilateral lower extremity edema. 3. Limited evaluation of the intra-abdominal  structures. 4. Aortic Atherosclerosis (ICD10-I70.0) and Emphysema (ICD10-J43.9). Electronically Signed   By: Markus Daft M.D.   On: 08/31/2018 15:57    EKG:   Orders placed or performed during the hospital encounter of 08/31/18  . EKG 12-Lead  . EKG 12-Lead    ASSESSMENT AND PLAN:   77 year old female with past medical history significant for left breast cancer, COPD not on home oxygen, CAD, ongoing smoking, cachexia presents to hospital secondary to worsening shortness of breath.  1.  Acute hypoxic respiratory failure-secondary to COPD exacerbation -Placed on BiPAP, doing better this morning -Wean off BiPAP and placed nasal cannula -Appreciate pulmonary consult -On IV steroids, Levaquin and nebs/inhalers -CT chest with angiogram negative for pulmonary embolism but patient has severe emphysema  2.  Elevated troponin-elevated troponin but plateaued and downtrending. -Cardiology consulted.  Currently on heparin drip but denies any chest pain -Echocardiogram has been ordered. -Recommend aspirin and statin for now -Received 1 dose of Lasix yesterday for pedal edema and elevated BNP.  Follow-up echocardiogram  3.  Hyperkalemia-received Veltassa and corrected potassium this morning  4.  Severe malnutrition/cachexia-dietitian consult  5.  Depression and anxiety-on Wellbutrin  6.  DVT prophylaxis-currently on heparin drip  7.  Tobacco use disorder-counseled   All the records are reviewed and case discussed with Care Management/Social Workerr. Management plans discussed with the patient, family and they are in agreement.  CODE STATUS: DNR  TOTAL TIME TAKING CARE OF THIS PATIENT: 37 minutes.   POSSIBLE D/C IN 2 DAYS, DEPENDING ON CLINICAL CONDITION.   Gladstone Lighter M.D on 09/01/2018 at 11:06 AM  Between 7am to 6pm - Pager - [phone]  After 6pm go to [email or web address] - password [email or web address] [facility]  Office  [phone]  CC: Primary care physician;  Juline Patch, MD

## 2018-09-01 NOTE — Consult Note (Signed)
ANTICOAGULATION CONSULT NOTE - Initial Consult  Pharmacy Consult for heparin drip Indication: chest pain/ACS  Allergies  Allergen Reactions  . Penicillin G Other (See Comments)  . Penicillins Hives    Has patient had a PCN reaction causing immediate rash, facial/tongue/throat swelling, SOB or lightheadedness with hypotension: No Has patient had a PCN reaction causing severe rash involving mucus membranes or skin necrosis: No Has patient had a PCN reaction that required hospitalization: No Has patient had a PCN reaction occurring within the last 10 years: No If all of the above answers are "NO", then may proceed with Cephalosporin use.   . Shrimp [Shellfish Allergy] Swelling    Patient Measurements: Height: 5\' 2"  (157.5 cm) Weight: 84 lb (38.1 kg) IBW/kg (Calculated) : 50.1 Heparin Dosing Weight: 38.1kg  Vital Signs: Temp: 97.8 F (36.6 C) (12/06 1400) Temp Source: Oral (12/06 1400) BP: 110/56 (12/06 1400) Pulse Rate: 105 (12/06 1400)  Labs: Recent Labs    08/31/18 1458 08/31/18 2221 09/01/18 0219 09/01/18 0557 09/01/18 1112  HGB 16.7*  --  14.7  --   --   HCT 51.3*  --  44.8  --   --   PLT 163  --  153  --   --   APTT 30  --   --   --   --   LABPROT 15.8*  --   --   --   --   INR 1.27  --   --   --   --   HEPARINUNFRC  --   --  <0.10*  --  <0.10*  CREATININE 1.92*  --  1.34*  --   --   TROPONINI 0.89* 0.77* 0.68* 0.66*  --     Estimated Creatinine Clearance: 21.8 mL/min (A) (by C-G formula based on SCr of 1.34 mg/dL (H)).   Medical History: Past Medical History:  Diagnosis Date  . Aortic atherosclerosis ([place])    a. 08/2018 noted on CT  . Breast cancer ([place]) 2004   left lumpectomy  . Cachexia ([place])   . COPD (chronic obstructive pulmonary disease) ([place])   . Coronary artery calcification seen on CT scan    a. 08/2018 seen on CT.  Marland Kitchen Personal history of radiation therapy   . Post-menopause bleeding   . Right renal artery stenosis ([place])    a. 08/2018 CTA  Abd/Lower ext: Moderate R RA stenosis @ origin.  . Tobacco abuse    a. at least 62 y pack history.    Medications:  Scheduled:  . aspirin EC  81 mg Oral Daily  . atorvastatin  40 mg Oral q1800  . budesonide (PULMICORT) nebulizer solution  0.5 mg Nebulization BID  . buPROPion  75 mg Oral BID  . cholecalciferol  1,000 Units Oral Daily  . feeding supplement (ENSURE ENLIVE)  237 mL Oral BID BM  . ipratropium-albuterol  3 mL Nebulization Q6H  . methylPREDNISolone (SOLU-MEDROL) injection  40 mg Intravenous Q12H  . multivitamin with minerals  1 tablet Oral Daily  . pyridOXINE  200 mg Oral Daily  . vitamin C  250 mg Oral BID    Assessment: Patient is a 77 year old female who presents with hypoxia, LEE, non-palpable pulses to lower extremities. Found to have elevated troponin. Basline CBC WNL. No anticoag PTA seen.  12/6 11:00 Heparin level resulted at <0.10, Heparin infusing at 600 units/hr.   Goal of Therapy:  Heparin level 0.3-0.7 units/ml Monitor platelets by anticoagulation protocol: Yes   Plan:  Will  rebolus w/ heparin 1100 units IV x 1 and increase rate to 700 units/hr and will recheck HL in 8 hours. Daily CBC while on Heparin drip.  Paulina Fusi, PharmD, BCPS 09/01/2018 2:40 PM

## 2018-09-01 NOTE — Progress Notes (Addendum)
Initial Nutrition Assessment  DOCUMENTATION CODES:   Severe malnutrition in context of chronic illness  INTERVENTION:   Ensure Enlive po BID, each supplement provides 350 kcal and 20 grams of protein  MVI daily.   Vitamin C 250 mg BID.   Refeed Risk.   NUTRITION DIAGNOSIS:   Severe Malnutrition related to chronic illness(COPD and breast cancer) as evidenced by severe fat depletion, severe muscle depletion.   GOAL:   Patient will meet greater than or equal to 90% of their needs   MONITOR:   PO intake, Weight trends, Supplement acceptance, Labs  REASON FOR ASSESSMENT:   Malnutrition Screening Tool    ASSESSMENT:   77 year old female with past medical history of COPD, ongoing tobacco abuse, history of breast cancer, anxiety who presents to the hospital due to worsening lower extremity edema and shortness of breath.  Pt alert and oriented when RD and DI entered the room. Pt reported not eating well at home due to a death in the family recently which pt mentioned lead to some weight loss.  Pt with severe malnutrition but weight have been stable per the chart 12/5 83.8#, 9/10 78.76, 12/31 85.58. Refeeding risk due to poor po.  Pt was compliant with chocolate Ensure Enlive with ice, but not Magic Cups.   Medications Reviewed and Include: Cholecalciferol Solumedrol Pyridoxine  Labs Reviewed:   BUN 83 ( H)  Creatine 1.34 (H)  Ca 8.5 (L)  NUTRITION - FOCUSED PHYSICAL EXAM:    Most Recent Value  Orbital Region  Severe depletion  Upper Arm Region  Severe depletion  Thoracic and Lumbar Region  Severe depletion  Buccal Region  Severe depletion  Temple Region  Severe depletion  Clavicle Bone Region  Severe depletion  Clavicle and Acromion Bone Region  Severe depletion  Scapular Bone Region  Severe depletion  Dorsal Hand  Severe depletion  Patellar Region  Severe depletion  Anterior Thigh Region  Severe depletion  Posterior Calf Region  Severe depletion  Edema  (RD Assessment)  Moderate  Hair  Reviewed  Eyes  Reviewed  Mouth  Reviewed  Skin  Reviewed [ecchymosis]  Nails  Reviewed       Diet Order:   Diet Order            Diet regular Room service appropriate? Yes; Fluid consistency: Thin  Diet effective now              EDUCATION NEEDS:   No education needs have been identified at this time  Skin:  Skin Assessment: Reviewed RN Assessment(ecchymosis BUE)  Last BM:  12/4  Height:   Ht Readings from Last 1 Encounters:  08/31/18 5\' 2"  (1.575 m)    Weight:   Wt Readings from Last 1 Encounters:  08/31/18 38.1 kg    Ideal Body Weight:  50 kg  BMI:  Body mass index is 15.36 kg/m.  Estimated Nutritional Needs:   Kcal:  1150-1350  Protein:  55-65 g  Fluid:  950 ml    Mauricia Area, MS, Dietetic Intern Pager: [phone] After hours Pager: [phone]

## 2018-09-01 NOTE — Progress Notes (Signed)
*  PRELIMINARY RESULTS* Echocardiogram 2D Echocardiogram has been performed.  Gabriella Wiggins 09/01/2018, 10:53 AM

## 2018-09-01 NOTE — Consult Note (Signed)
ANTICOAGULATION CONSULT NOTE - Initial Consult  Pharmacy Consult for heparin drip Indication: chest pain/ACS  Allergies  Allergen Reactions  . Penicillin G Other (See Comments)  . Penicillins Hives    Has patient had a PCN reaction causing immediate rash, facial/tongue/throat swelling, SOB or lightheadedness with hypotension: No Has patient had a PCN reaction causing severe rash involving mucus membranes or skin necrosis: No Has patient had a PCN reaction that required hospitalization: No Has patient had a PCN reaction occurring within the last 10 years: No If all of the above answers are "NO", then may proceed with Cephalosporin use.   . Shrimp [Shellfish Allergy] Swelling    Patient Measurements: Height: 5\' 2"  (157.5 cm) Weight: 84 lb (38.1 kg) IBW/kg (Calculated) : 50.1 Heparin Dosing Weight: 38.1kg  Vital Signs: Temp: 97.8 F (36.6 C) (12/06 1400) Temp Source: Oral (12/06 1400) BP: 114/64 (12/06 1900) Pulse Rate: 105 (12/06 1900)  Labs: Recent Labs    08/31/18 1458 08/31/18 2221 09/01/18 0219 09/01/18 0557 09/01/18 1112 09/01/18 2147  HGB 16.7*  --  14.7  --   --   --   HCT 51.3*  --  44.8  --   --   --   PLT 163  --  153  --   --   --   APTT 30  --   --   --   --   --   LABPROT 15.8*  --   --   --   --   --   INR 1.27  --   --   --   --   --   HEPARINUNFRC  --   --  <0.10*  --  <0.10* 0.15*  CREATININE 1.92*  --  1.34*  --   --   --   TROPONINI 0.89* 0.77* 0.68* 0.66*  --   --     Estimated Creatinine Clearance: 21.8 mL/min (A) (by C-G formula based on SCr of 1.34 mg/dL (H)).   Medical History: Past Medical History:  Diagnosis Date  . Aortic atherosclerosis ([place])    a. 08/2018 noted on CT  . Breast cancer ([place]) 2004   left lumpectomy  . Cachexia ([place])   . COPD (chronic obstructive pulmonary disease) ([place])   . Coronary artery calcification seen on CT scan    a. 08/2018 seen on CT.  Marland Kitchen Personal history of radiation therapy   . Post-menopause  bleeding   . Right renal artery stenosis ([place])    a. 08/2018 CTA Abd/Lower ext: Moderate R RA stenosis @ origin.  . Tobacco abuse    a. at least 18 y pack history.    Medications:  Scheduled:  . aspirin EC  81 mg Oral Daily  . atorvastatin  40 mg Oral q1800  . budesonide (PULMICORT) nebulizer solution  0.5 mg Nebulization BID  . buPROPion  75 mg Oral BID  . cholecalciferol  1,000 Units Oral Daily  . feeding supplement (ENSURE ENLIVE)  237 mL Oral BID BM  . heparin  1,200 Units Intravenous Once  . ipratropium-albuterol  3 mL Nebulization Q6H  . methylPREDNISolone (SOLU-MEDROL) injection  40 mg Intravenous Q12H  . multivitamin with minerals  1 tablet Oral Daily  . pyridOXINE  200 mg Oral Daily  . vitamin C  250 mg Oral BID    Assessment: Patient is a 77 year old female who presents with hypoxia, LEE, non-palpable pulses to lower extremities. Found to have elevated troponin. Basline CBC WNL. No anticoag PTA  seen.  12/6 11:00 Heparin level resulted at <0.10, Heparin infusing at 600 units/hr.   Goal of Therapy:  Heparin level 0.3-0.7 units/ml Monitor platelets by anticoagulation protocol: Yes   Plan:  12/06 @ 2200 HL 0.15 subtherapeutic. Will rebolus w/ heparin 1200 units IV x 1 and increase rate to 800 units/hr and will recheck HL/CBC w/ am labs.  Gabriella Wiggins, PharmD, BCPS Clinical Pharmacist 09/01/2018

## 2018-09-02 DIAGNOSIS — E43 Unspecified severe protein-calorie malnutrition: Secondary | ICD-10-CM

## 2018-09-02 LAB — HEPATIC FUNCTION PANEL
ALT: 503 U/L — ABNORMAL HIGH (ref 0–44)
AST: 194 U/L — ABNORMAL HIGH (ref 15–41)
Albumin: 2.6 g/dL — ABNORMAL LOW (ref 3.5–5.0)
Alkaline Phosphatase: 107 U/L (ref 38–126)
BILIRUBIN TOTAL: 0.8 mg/dL (ref 0.3–1.2)
Bilirubin, Direct: 0.4 mg/dL — ABNORMAL HIGH (ref 0.0–0.2)
Indirect Bilirubin: 0.4 mg/dL (ref 0.3–0.9)
Total Protein: 6 g/dL — ABNORMAL LOW (ref 6.5–8.1)

## 2018-09-02 LAB — CBC
HCT: 43.7 % (ref 36.0–46.0)
Hemoglobin: 14 g/dL (ref 12.0–15.0)
MCH: 32.8 pg (ref 26.0–34.0)
MCHC: 32 g/dL (ref 30.0–36.0)
MCV: 102.3 fL — AB (ref 80.0–100.0)
NRBC: 0.4 % — AB (ref 0.0–0.2)
Platelets: 180 10*3/uL (ref 150–400)
RBC: 4.27 MIL/uL (ref 3.87–5.11)
RDW: 13.3 % (ref 11.5–15.5)
WBC: 13.9 10*3/uL — ABNORMAL HIGH (ref 4.0–10.5)

## 2018-09-02 LAB — HEPARIN LEVEL (UNFRACTIONATED)
Heparin Unfractionated: 0.17 IU/mL — ABNORMAL LOW (ref 0.30–0.70)
Heparin Unfractionated: 0.39 IU/mL (ref 0.30–0.70)
Heparin Unfractionated: 0.36 IU/mL (ref 0.30–0.70)

## 2018-09-02 LAB — BASIC METABOLIC PANEL
Anion gap: 7 (ref 5–15)
BUN: 64 mg/dL — ABNORMAL HIGH (ref 8–23)
CO2: 36 mmol/L — ABNORMAL HIGH (ref 22–32)
CREATININE: 0.96 mg/dL (ref 0.44–1.00)
Calcium: 8.9 mg/dL (ref 8.9–10.3)
Chloride: 99 mmol/L (ref 98–111)
GFR calc Af Amer: >60 mL/min (ref >60)
GFR calc non Af Amer: 58 mL/min — ABNORMAL LOW (ref >60)
Glucose, Bld: 150 mg/dL — ABNORMAL HIGH (ref 70–99)
Potassium: 4.6 mmol/L (ref 3.5–5.1)
Sodium: 142 mmol/L (ref 135–145)

## 2018-09-02 LAB — PHOSPHORUS: Phosphorus: 2 mg/dL — ABNORMAL LOW (ref 2.5–4.6)

## 2018-09-02 LAB — MAGNESIUM: Magnesium: 2.2 mg/dL (ref 1.7–2.4)

## 2018-09-02 MED ORDER — ORAL CARE MOUTH RINSE
15.0000 mL | Freq: Two times a day (BID) | OROMUCOSAL | Status: DC
  Administered 2018-09-02 – 2018-09-06 (×7): 15 mL via OROMUCOSAL

## 2018-09-02 MED ORDER — LEVOFLOXACIN IN D5W 750 MG/150ML IV SOLN
750.0000 mg | INTRAVENOUS | Status: DC
  Administered 2018-09-02 – 2018-09-04 (×2): 750 mg via INTRAVENOUS
  Filled 2018-09-02 (×4): qty 150

## 2018-09-02 MED ORDER — NICOTINE 21 MG/24HR TD PT24
21.0000 mg | MEDICATED_PATCH | Freq: Every day | TRANSDERMAL | Status: DC
  Administered 2018-09-02 – 2018-09-06 (×5): 21 mg via TRANSDERMAL
  Filled 2018-09-02 (×5): qty 1

## 2018-09-02 MED ORDER — HEPARIN BOLUS VIA INFUSION
1200.0000 [IU] | Freq: Once | INTRAVENOUS | Status: AC
  Administered 2018-09-02: 1200 [IU] via INTRAVENOUS
  Filled 2018-09-02: qty 1200

## 2018-09-02 NOTE — Progress Notes (Signed)
Patient alert and oriented.  No acute concerns. Report given to oncoming nurse, Danae Chen, RN.

## 2018-09-02 NOTE — Consult Note (Signed)
ANTICOAGULATION CONSULT NOTE - Initial Consult  Pharmacy Consult for heparin drip Indication: chest pain/ACS  Allergies  Allergen Reactions  . Penicillin G Other (See Comments)  . Penicillins Hives    Has patient had a PCN reaction causing immediate rash, facial/tongue/throat swelling, SOB or lightheadedness with hypotension: No Has patient had a PCN reaction causing severe rash involving mucus membranes or skin necrosis: No Has patient had a PCN reaction that required hospitalization: No Has patient had a PCN reaction occurring within the last 10 years: No If all of the above answers are "NO", then may proceed with Cephalosporin use.   . Shrimp [Shellfish Allergy] Swelling    Patient Measurements: Height: 5\' 2"  (157.5 cm) Weight: 84 lb (38.1 kg) IBW/kg (Calculated) : 50.1 Heparin Dosing Weight: 38.1kg  Vital Signs: Temp: 98.1 F (36.7 C) (12/07 0400) Temp Source: Axillary (12/07 0400) BP: 93/48 (12/07 0600) Pulse Rate: 102 (12/07 0600)  Labs: Recent Labs    08/31/18 1458 08/31/18 2221  09/01/18 0219 09/01/18 0557 09/01/18 1112 09/01/18 2147 09/02/18 0449  HGB 16.7*  --   --  14.7  --   --   --  14.0  HCT 51.3*  --   --  44.8  --   --   --  43.7  PLT 163  --   --  153  --   --   --  180  APTT 30  --   --   --   --   --   --   --   LABPROT 15.8*  --   --   --   --   --   --   --   INR 1.27  --   --   --   --   --   --   --   HEPARINUNFRC  --   --    < > <0.10*  --  <0.10* 0.15* 0.17*  CREATININE 1.92*  --   --  1.34*  --   --   --  0.96  TROPONINI 0.89* 0.77*  --  0.68* 0.66*  --   --   --    < > = values in this interval not displayed.    Estimated Creatinine Clearance: 30.5 mL/min (by C-G formula based on SCr of 0.96 mg/dL).   Medical History: Past Medical History:  Diagnosis Date  . Aortic atherosclerosis ([place])    a. 08/2018 noted on CT  . Breast cancer ([place]) 2004   left lumpectomy  . Cachexia ([place])   . COPD (chronic obstructive pulmonary disease)  ([place])   . Coronary artery calcification seen on CT scan    a. 08/2018 seen on CT.  Marland Kitchen Personal history of radiation therapy   . Post-menopause bleeding   . Right renal artery stenosis ([place])    a. 08/2018 CTA Abd/Lower ext: Moderate R RA stenosis @ origin.  . Tobacco abuse    a. at least 32 y pack history.    Medications:  Scheduled:  . aspirin EC  81 mg Oral Daily  . atorvastatin  40 mg Oral q1800  . budesonide (PULMICORT) nebulizer solution  0.5 mg Nebulization BID  . buPROPion  75 mg Oral BID  . cholecalciferol  1,000 Units Oral Daily  . feeding supplement (ENSURE ENLIVE)  237 mL Oral BID BM  . heparin  1,200 Units Intravenous Once  . ipratropium-albuterol  3 mL Nebulization Q6H  . methylPREDNISolone (SOLU-MEDROL) injection  40 mg Intravenous Q12H  .  multivitamin with minerals  1 tablet Oral Daily  . pyridOXINE  200 mg Oral Daily  . vitamin C  250 mg Oral BID    Assessment: Patient is a 77 year old female who presents with hypoxia, LEE, non-palpable pulses to lower extremities. Found to have elevated troponin. Basline CBC WNL. No anticoag PTA seen.  12/6 11:00 Heparin level resulted at <0.10, Heparin infusing at 600 units/hr.   Goal of Therapy:  Heparin level 0.3-0.7 units/ml Monitor platelets by anticoagulation protocol: Yes   Plan:  12/07 @ 0500 HL 0.17 subtherapeutic. Will rebolus w/ heparin 1200 units IV x 1 and will increase rate 900 units/hr and will recheck HL @ 1500, CBC stable will continue to monitor.  Tobie Lords, PharmD, BCPS Clinical Pharmacist 09/02/2018

## 2018-09-02 NOTE — Progress Notes (Signed)
PHARMACY NOTE:  ANTIMICROBIAL RENAL DOSAGE ADJUSTMENT  Current antimicrobial regimen includes a mismatch between antimicrobial dosage and estimated renal function.  As per policy approved by the Pharmacy & Therapeutics and Medical Executive Committees, the antimicrobial dosage will be adjusted accordingly.  Current antimicrobial dosage:  levaquin 750 mg x1 then 500 mg q48h   Indication: COPD exacerbation  Renal Function:  Estimated Creatinine Clearance: 30.5 mL/min (by C-G formula based on SCr of 0.96 mg/dL).     Antimicrobial dosage has been changed to:  levaquin 750 mg IV q48h based on initial dose chose    Thank you for allowing pharmacy to be a part of this patient's care.  Rocky Morel, [facility] 09/02/2018 3:19 PM

## 2018-09-02 NOTE — Progress Notes (Signed)
Family Meeting Note  Advance Directive:yes  Today a meeting took place with the spouse and daughter.  Patient is unable to participate due YT:MMITVI capacity Acuity of condition and depression due to recently died son.   The following clinical team members were present during this meeting:MD  The following were discussed:Patient's diagnosis: COPD with respiratory failure, right-sided heart failure, elevated liver enzymes and troponin, severe malnutrition, depression, Patient's progosis: < 12 months and Goals for treatment: DNR  Discussed with patient's husband and daughter in the room about her terminal illness due to her COPD and right-sided heart failure.  They are willing to get hospice care involved at home after discharge.  Additional follow-up to be provided: Palliative care  Time spent during discussion:20 minutes  Vaughan Basta, MD

## 2018-09-02 NOTE — Progress Notes (Signed)
Patient resting comfortably overnight, sleeping intermittently no complaints of pain or need. See CHL/MAR for changes and further update.

## 2018-09-02 NOTE — Progress Notes (Signed)
Follow up - Critical Care Medicine Note  Patient Details:    Gabriella Wiggins is an 77 y.o. female.smoker with very advanced emphysema (but has never seen a pulmonologist) admitted via ED with severe hypoxemic respiratory failure, severe lower extremity edema and profound cachexia. Admitting diagnosis COPD exacerbation  Lines, Airways, Drains: External Urinary Catheter (Active)  Collection Container Dedicated Suction Canister 09/01/2018  3:30 AM    Anti-infectives:  Anti-infectives (From admission, onward)   Start     Dose/Rate Route Frequency Ordered Stop   09/02/18 1800  levofloxacin (LEVAQUIN) IVPB 500 mg     500 mg 100 mL/hr over 60 Minutes Intravenous Every 48 hours 08/31/18 1724     08/31/18 1800  levofloxacin (LEVAQUIN) IVPB 750 mg     750 mg 100 mL/hr over 90 Minutes Intravenous  Once 08/31/18 1724 08/31/18 2327      Microbiology: Results for orders placed or performed during the hospital encounter of 08/31/18  MRSA PCR Screening     Status: None   Collection Time: 08/31/18 10:03 PM  Result Value Ref Range Status   MRSA by PCR NEGATIVE NEGATIVE Final    Comment:        The GeneXpert MRSA Assay (FDA approved for NASAL specimens only), is one component of a comprehensive MRSA colonization surveillance program. It is not intended to diagnose MRSA infection nor to guide or monitor treatment for MRSA infections. Performed at [facility], [address] [place], [place] [address]     Studies: Ct Angio Chest Pe W And/or Wo Contrast  Result Date: 08/31/2018 CLINICAL DATA:  Hypoxia and decreased peripheral pulses EXAM: CT ANGIOGRAPHY CHEST WITH CONTRAST TECHNIQUE: Multidetector CT imaging of the chest was performed using the standard protocol during bolus administration of intravenous contrast. Multiplanar CT image reconstructions and MIPs were obtained to evaluate the vascular anatomy. CONTRAST:  49mL ISOVUE-370 IOPAMIDOL (ISOVUE-370) INJECTION 76% COMPARISON:   06/06/2018 chest x-ray FINDINGS: Cardiovascular: Thoracic aorta again demonstrates diffuse atherosclerotic calcifications without aneurysmal dilatation or signs of dissection. No cardiac enlargement is seen. Coronary calcifications are noted. The pulmonary artery shows a normal branching pattern. No filling defect to suggest pulmonary embolism is identified. Mediastinum/Nodes: The thoracic inlet is within normal limits. No significant hilar or mediastinal adenopathy is noted. The esophagus as visualized is within normal limits. Lungs/Pleura: Diffuse emphysematous changes are noted similar to that seen on prior plain film exam. Patchy atelectatic changes are noted in the bases bilaterally. No focal confluent infiltrate or sizable effusion is seen. Mild fibrotic changes are noted as well particularly on the left. Some areas of mucous plugging are noted in the left lower lobe. Upper Abdomen: Paucity of peritoneal fat is noted. No definitive acute abnormality is seen. Musculoskeletal: Chronic compression deformity at T3 is noted stable from the previous exam. Schmorl's node is noted superiorly at T10. Review of the MIP images confirms the above findings. IMPRESSION: No evidence of pulmonary emboli. COPD with fibrotic changes and mild basilar atelectasis. No acute infiltrate is seen. Some areas of mucous plugging are noted on the left lower lobe. Chronic compression deformity at T3. Aortic Atherosclerosis (ICD10-I70.0) and Emphysema (ICD10-J43.9). Electronically Signed   By: Inez Catalina M.D.   On: 08/31/2018 15:40   Ct Angio Ao+bifem W & Or Wo Contrast  Result Date: 08/31/2018 CLINICAL DATA:  77 year old with low oxygen saturations and non palpable pulses in lower extremity. Bilateral lower extremity edema. EXAM: CT ANGIOGRAPHY OF ABDOMINAL AORTA WITH ILIOFEMORAL RUNOFF TECHNIQUE: Multidetector CT imaging of the  abdomen, pelvis and lower extremities was performed using the standard protocol during bolus  administration of intravenous contrast. Multiplanar CT image reconstructions and MIPs were obtained to evaluate the vascular anatomy. CONTRAST:  40mL ISOVUE-370 IOPAMIDOL (ISOVUE-370) INJECTION 76% COMPARISON:  Chest CT 8 08/31/2018 FINDINGS: VASCULAR Aorta: Diffuse atherosclerotic disease in the abdominal aorta without dissection or aneurysm. Celiac: Celiac trunk is patent. There appears to be variant anatomy with a replaced common hepatic artery from the SMA. SMA: SMA and main branches are patent. Renals: Atherosclerotic disease at the origin left renal artery without significant stenosis. Irregularity and stenosis at the origin of the right renal artery. IMA: IMA is patent. RIGHT Lower Extremity Inflow: Atherosclerotic disease in the right iliac arteries without significant stenosis. Outflow: Common, superficial and profunda femoral arteries and the popliteal artery are patent without evidence of aneurysm, dissection, vasculitis or significant stenosis. Runoff: 2 vessel runoff in the right lower extremity. The anterior tibial artery occludes in the mid calf. Posterior tibial artery appears to be patent at the ankle but limited evaluation because of the timing of the study. Limited evaluation of the arteries in the foot. LEFT Lower Extremity Inflow: Atherosclerotic calcifications of iliac arteries without significant stenosis. Outflow: Common, superficial and profunda femoral arteries and the popliteal artery are patent without evidence of aneurysm, dissection, vasculitis or significant stenosis. Runoff: Three-vessel runoff in the left lower extremity. Limited evaluation of the ankle arteries due to the timing of the study. Veins: Limited evaluation of the veins due to the arterial phase of contrast. Review of the MIP images confirms the above findings. NON-VASCULAR Lower chest: Severe centrilobular emphysema. Patchy peripheral densities at the left lung base are nonspecific but could be related atelectasis or  scarring. Refer to the recent chest CTA for evaluation of the lungs. No large pleural effusions. Hepatobiliary: Liver has unusual configuration and extends low into the midline. Gallbladder appears to be on sequence 5, image 60 but the liver and biliary system is poorly characterized on this examination. Pancreas: Limited evaluation of the pancreas. Spleen: Limited evaluation of the spleen due to the arterial phase of enhancement. No splenomegaly. Adrenals/Urinary Tract: Limited evaluation of the adrenal glands. No gross abnormality or hydronephrosis involving the kidneys. Fluid in the urinary bladder. Stomach/Bowel: Large amount of stool in the rectum. Limited evaluation of the bowel structures due to the lack of intra-abdominal fat and lack of oral contrast. Lymphatic: No significant lymph node enlargement in the abdomen or pelvis but limited evaluation. Reproductive: Uterus appears to be present but very limited evaluation. Limited evaluation of the adnexa and ovaries. Other: No free fluid.  Negative for free air. Musculoskeletal: Dextroscoliosis in the lumbar spine. Diffuse subcutaneous edema in the lower extremities particularly below the knees. IMPRESSION: VASCULAR 1. No significant occlusive disease in the abdomen, pelvis or lower extremities. 2. Two vessel runoff disease in the right lower extremity and three vessel runoff disease in left lower extremity. No significant contrast in the ankles or feet but this is probably related to the timing of the study. 3. 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 Stenosis is moderate in severity. NON-VASCULAR 1. Severe centrilobular emphysema. Patchy densities at the lung bases are nonspecific, refer to the recent chest CT for additional evaluation. 2. Bilateral lower extremity edema. 3. Limited evaluation of the intra-abdominal structures. 4. Aortic Atherosclerosis (ICD10-I70.0) and Emphysema (ICD10-J43.9). Electronically Signed   By: Markus Daft M.D.   On: 08/31/2018  15:57    Consults:    Subjective:  Overnight Issues: No significant issues overnight.  Patient weaned off of BiPAP, states she is feeling much better this morning  Objective:  Vital signs for last 24 hours: Temp:  [97.7 F (36.5 C)-98.2 F (36.8 C)] 98.2 F (36.8 C) (12/07 0800) Pulse Rate:  [96-109] 101 (12/07 0800) Resp:  [16-24] 20 (12/07 0800) BP: (93-117)/(44-103) 107/47 (12/07 0800) SpO2:  [87 %-100 %] 98 % (12/07 0800)  Hemodynamic parameters for last 24 hours:    Intake/Output from previous day: 12/06 0701 - 12/07 0700 In: 663.4 [P.O.:480; I.V.:183.4] Out: 225 [Urine:225]  Intake/Output this shift: Total I/O In: 21.1 [I.V.:21.1] Out: -   Physical Exam:  General: Profoundly cachectic, adequately supported on BiPAP, LOC normal, cognition appears intact Neuro: CNs intact, moves all extremities, DTRs symmetric HEENT: Temporal wasting, NCAT, sclerae white Cardiovascular: Regular, no M noted Lungs: Hyperresonant to percussion, markedly diminished breath sounds throughout, scattered rhonchi, no wheezes Abdomen: Scaphoid, soft, NT, + BS Extremities: 3+ symmetric pretibial ankle and pedal edema, violaceous digits with diminished pulses but normal cap refill   Assessment/Plan:   Hypoxemic respiratory failure.  Patient with advanced emphysema noted on CT scan of chest.  Presently on appropriate therapeutics to include Solu-Medrol, bronchodilators and empiric Levaquin.  She has been weaned off BiPAP and is feeling better. Patient has severe cachexia most likely related to emphysema along with probable cor pulmonale.  Elevated troponin.  Patient has been started on heparin and cardiology has been consulted pending their recommendations.  Elevated BNP.  May reflect cor pulmonale.  Pending echocardiography  Renal insufficiency.  Predominantly prerenal with a BUN of 64/creatinine 0.96 she is improving.   Arterial vascular disease.  Please see results of CT angiogram  yesterday  Leukocytosis.  This morning's labs 13.9, presently on Levaquin  Transaminitis.  ALT is 503/AST is 194, low albumin and total protein will need further imaging study and close follow-up.  Point patient is doing well stable for floor transfer  Hermelinda Dellen, DO  Jayana Kotula 09/02/2018  *Care during the described time interval was provided by me and/or other providers on the critical care team.  I have reviewed this patient's available data, including medical history, events of note, physical examination and test results as part of my evaluation. Patient ID: Gabriella Wiggins, female   DOB: 1940-12-22, 77 y.o.   MRN: [id number]

## 2018-09-02 NOTE — Consult Note (Signed)
[place] for heparin drip Indication: chest pain/ACS  Allergies  Allergen Reactions  . Penicillin G Other (See Comments)  . Penicillins Hives    Has patient had a PCN reaction causing immediate rash, facial/tongue/throat swelling, SOB or lightheadedness with hypotension: No Has patient had a PCN reaction causing severe rash involving mucus membranes or skin necrosis: No Has patient had a PCN reaction that required hospitalization: No Has patient had a PCN reaction occurring within the last 10 years: No If all of the above answers are "NO", then may proceed with Cephalosporin use.   . Shrimp [Shellfish Allergy] Swelling    Patient Measurements: Height: 5\' 2"  (157.5 cm) Weight: 84 lb (38.1 kg) IBW/kg (Calculated) : 50.1 Heparin Dosing Weight: 38.1kg  Vital Signs: Temp: 97.7 F (36.5 C) (12/07 1917) Temp Source: Oral (12/07 1917) BP: 116/64 (12/07 1917) Pulse Rate: 118 (12/07 1917)  Labs: Recent Labs    08/31/18 1458 08/31/18 2221 09/01/18 0219 09/01/18 0557  09/02/18 0449 09/02/18 1444 09/02/18 2250  HGB 16.7*  --  14.7  --   --  14.0  --   --   HCT 51.3*  --  44.8  --   --  43.7  --   --   PLT 163  --  153  --   --  180  --   --   APTT 30  --   --   --   --   --   --   --   LABPROT 15.8*  --   --   --   --   --   --   --   INR 1.27  --   --   --   --   --   --   --   HEPARINUNFRC  --   --  <0.10*  --    < > 0.17* 0.39 0.36  CREATININE 1.92*  --  1.34*  --   --  0.96  --   --   TROPONINI 0.89* 0.77* 0.68* 0.66*  --   --   --   --    < > = values in this interval not displayed.    Estimated Creatinine Clearance: 30.5 mL/min (by C-G formula based on SCr of 0.96 mg/dL).   Medical History: Past Medical History:  Diagnosis Date  . Aortic atherosclerosis ([place])    a. 08/2018 noted on CT  . Breast cancer ([place]) 2004   left lumpectomy  . Cachexia ([place])   . COPD (chronic obstructive pulmonary disease) ([place])   . Coronary artery  calcification seen on CT scan    a. 08/2018 seen on CT.  Marland Kitchen Personal history of radiation therapy   . Post-menopause bleeding   . Right renal artery stenosis ([place])    a. 08/2018 CTA Abd/Lower ext: Moderate R RA stenosis @ origin.  . Tobacco abuse    a. at least 46 y pack history.    Medications:  Scheduled:  . aspirin EC  81 mg Oral Daily  . atorvastatin  40 mg Oral q1800  . budesonide (PULMICORT) nebulizer solution  0.5 mg Nebulization BID  . buPROPion  75 mg Oral BID  . cholecalciferol  1,000 Units Oral Daily  . feeding supplement (ENSURE ENLIVE)  237 mL Oral BID BM  . ipratropium-albuterol  3 mL Nebulization Q6H  . mouth rinse  15 mL Mouth Rinse BID  . methylPREDNISolone (SOLU-MEDROL) injection  40 mg Intravenous Q12H  . multivitamin  with minerals  1 tablet Oral Daily  . nicotine  21 mg Transdermal Daily  . pyridOXINE  200 mg Oral Daily  . vitamin C  250 mg Oral BID    Assessment: Patient is a 77 year old female who presents with hypoxia, LEE, non-palpable pulses to lower extremities. Found to have elevated troponin. Basline CBC WNL. No anticoag PTA seen.  12/6 11:00 Heparin level resulted at <0.10, Heparin infusing at 600 units/hr.   12/07 @ 0500 HL 0.17 subtherapeutic. Rebolus'ed w/ heparin 1200 units IV x 1 and increased rate 900 units/hr   Goal of Therapy:  Heparin level 0.3-0.7 units/ml Monitor platelets by anticoagulation protocol: Yes   Plan:  12/7 @ 2300 HL 0.36 therapeutic. Will continue current rate and will recheck HL w/ am labs. CBC stable.  Tobie Lords, PharmD, BCPS Clinical Pharmacist 09/02/2018 11:28 PM

## 2018-09-02 NOTE — Progress Notes (Signed)
[facility] [place] at [place] NAME: Gabriella Wiggins    MR#:  [id number]  DATE OF BIRTH:  June 25, 1941  SUBJECTIVE:  CHIEF COMPLAINT:   Chief Complaint  Patient presents with  . Leg Swelling  . Shortness of Breath   -Very malnourished patient, laying in bed, was on BiPAP initially and in stepdown now improved and on nasal cannula oxygen. -States she is feeling better. -Still have some cough with gurgling sounds.  REVIEW OF SYSTEMS:  Review of Systems  Constitutional: Positive for malaise/fatigue and weight loss. Negative for chills and fever.  HENT: Negative for congestion, ear discharge, hearing loss and nosebleeds.   Eyes: Negative for blurred vision and double vision.  Respiratory: Positive for shortness of breath. Negative for cough and wheezing.   Cardiovascular: Positive for leg swelling. Negative for chest pain and palpitations.  Gastrointestinal: Negative for abdominal pain, constipation, diarrhea, nausea and vomiting.  Genitourinary: Negative for dysuria.  Musculoskeletal: Negative for myalgias.  Neurological: Negative for dizziness, focal weakness, seizures, weakness and headaches.  Psychiatric/Behavioral: Negative for depression.    DRUG ALLERGIES:   Allergies  Allergen Reactions  . Penicillin G Other (See Comments)  . Penicillins Hives    Has patient had a PCN reaction causing immediate rash, facial/tongue/throat swelling, SOB or lightheadedness with hypotension: No Has patient had a PCN reaction causing severe rash involving mucus membranes or skin necrosis: No Has patient had a PCN reaction that required hospitalization: No Has patient had a PCN reaction occurring within the last 10 years: No If all of the above answers are "NO", then may proceed with Cephalosporin use.   . Shrimp [Shellfish Allergy] Swelling    VITALS:  Blood pressure (!) 115/48, pulse (!) 112, temperature 98 F (36.7 C), temperature source Oral, resp. rate  20, height 5\' 2"  (1.575 m), weight 38.1 kg, SpO2 90 %.  PHYSICAL EXAMINATION:  Physical Exam   GENERAL:  77 y.o.-year-old malnourished patient lying in the bed with no acute distress.  EYES: Pupils equal, round, reactive to light and accommodation. No scleral icterus. Extraocular muscles intact.  HEENT: Head atraumatic, normocephalic. Oropharynx and nasopharynx clear.  NECK:  Supple, no jugular venous distention. No thyroid enlargement, no tenderness.  LUNGS: Normal breath sounds bilaterally, some wheezing, coarse crepitation.  Positive use of accessory muscles of respiration.  CARDIOVASCULAR: S1, S2 normal. No murmurs, rubs, or gallops.  ABDOMEN: Soft, nontender, nondistended. Bowel sounds present. No organomegaly or mass.  EXTREMITIES: No   cyanosis, or clubbing.  2+ pedal edema noted NEUROLOGIC: Cranial nerves II through XII are intact. Muscle strength 5/5 in all extremities. Sensation intact. Gait not checked.  PSYCHIATRIC: The patient is alert and oriented x 3.  SKIN: No obvious rash, lesion, or ulcer.    LABORATORY PANEL:   CBC Recent Labs  Lab 09/02/18 0449  WBC 13.9*  HGB 14.0  HCT 43.7  PLT 180   ------------------------------------------------------------------------------------------------------------------  Chemistries  Recent Labs  Lab 09/02/18 0449  NA 142  K 4.6  CL 99  CO2 36*  GLUCOSE 150*  BUN 64*  CREATININE 0.96  CALCIUM 8.9  MG 2.2  AST 194*  ALT 503*  ALKPHOS 107  BILITOT 0.8   ------------------------------------------------------------------------------------------------------------------  Cardiac Enzymes Recent Labs  Lab 09/01/18 0557  TROPONINI 0.66*   ------------------------------------------------------------------------------------------------------------------  RADIOLOGY:  Ct Angio Chest Pe W And/or Wo Contrast  Result Date: 08/31/2018 CLINICAL DATA:  Hypoxia and decreased peripheral pulses EXAM: CT ANGIOGRAPHY CHEST WITH  CONTRAST TECHNIQUE:  Multidetector CT imaging of the chest was performed using the standard protocol during bolus administration of intravenous contrast. Multiplanar CT image reconstructions and MIPs were obtained to evaluate the vascular anatomy. CONTRAST:  58mL ISOVUE-370 IOPAMIDOL (ISOVUE-370) INJECTION 76% COMPARISON:  06/06/2018 chest x-ray FINDINGS: Cardiovascular: Thoracic aorta again demonstrates diffuse atherosclerotic calcifications without aneurysmal dilatation or signs of dissection. No cardiac enlargement is seen. Coronary calcifications are noted. The pulmonary artery shows a normal branching pattern. No filling defect to suggest pulmonary embolism is identified. Mediastinum/Nodes: The thoracic inlet is within normal limits. No significant hilar or mediastinal adenopathy is noted. The esophagus as visualized is within normal limits. Lungs/Pleura: Diffuse emphysematous changes are noted similar to that seen on prior plain film exam. Patchy atelectatic changes are noted in the bases bilaterally. No focal confluent infiltrate or sizable effusion is seen. Mild fibrotic changes are noted as well particularly on the left. Some areas of mucous plugging are noted in the left lower lobe. Upper Abdomen: Paucity of peritoneal fat is noted. No definitive acute abnormality is seen. Musculoskeletal: Chronic compression deformity at T3 is noted stable from the previous exam. Schmorl's node is noted superiorly at T10. Review of the MIP images confirms the above findings. IMPRESSION: No evidence of pulmonary emboli. COPD with fibrotic changes and mild basilar atelectasis. No acute infiltrate is seen. Some areas of mucous plugging are noted on the left lower lobe. Chronic compression deformity at T3. Aortic Atherosclerosis (ICD10-I70.0) and Emphysema (ICD10-J43.9). Electronically Signed   By: Inez Catalina M.D.   On: 08/31/2018 15:40   Ct Angio Ao+bifem W & Or Wo Contrast  Result Date: 08/31/2018 CLINICAL DATA:   77 year old with low oxygen saturations and non palpable pulses in lower extremity. Bilateral lower extremity edema. EXAM: CT ANGIOGRAPHY OF ABDOMINAL AORTA WITH ILIOFEMORAL RUNOFF TECHNIQUE: Multidetector CT imaging of the abdomen, pelvis and lower extremities was performed using the standard protocol during bolus administration of intravenous contrast. Multiplanar CT image reconstructions and MIPs were obtained to evaluate the vascular anatomy. CONTRAST:  69mL ISOVUE-370 IOPAMIDOL (ISOVUE-370) INJECTION 76% COMPARISON:  Chest CT 8 08/31/2018 FINDINGS: VASCULAR Aorta: Diffuse atherosclerotic disease in the abdominal aorta without dissection or aneurysm. Celiac: Celiac trunk is patent. There appears to be variant anatomy with a replaced common hepatic artery from the SMA. SMA: SMA and main branches are patent. Renals: Atherosclerotic disease at the origin left renal artery without significant stenosis. Irregularity and stenosis at the origin of the right renal artery. IMA: IMA is patent. RIGHT Lower Extremity Inflow: Atherosclerotic disease in the right iliac arteries without significant stenosis. Outflow: Common, superficial and profunda femoral arteries and the popliteal artery are patent without evidence of aneurysm, dissection, vasculitis or significant stenosis. Runoff: 2 vessel runoff in the right lower extremity. The anterior tibial artery occludes in the mid calf. Posterior tibial artery appears to be patent at the ankle but limited evaluation because of the timing of the study. Limited evaluation of the arteries in the foot. LEFT Lower Extremity Inflow: Atherosclerotic calcifications of iliac arteries without significant stenosis. Outflow: Common, superficial and profunda femoral arteries and the popliteal artery are patent without evidence of aneurysm, dissection, vasculitis or significant stenosis. Runoff: Three-vessel runoff in the left lower extremity. Limited evaluation of the ankle arteries due to the  timing of the study. Veins: Limited evaluation of the veins due to the arterial phase of contrast. Review of the MIP images confirms the above findings. NON-VASCULAR Lower chest: Severe centrilobular emphysema. Patchy peripheral densities at the left lung base  are nonspecific but could be related atelectasis or scarring. Refer to the recent chest CTA for evaluation of the lungs. No large pleural effusions. Hepatobiliary: Liver has unusual configuration and extends low into the midline. Gallbladder appears to be on sequence 5, image 60 but the liver and biliary system is poorly characterized on this examination. Pancreas: Limited evaluation of the pancreas. Spleen: Limited evaluation of the spleen due to the arterial phase of enhancement. No splenomegaly. Adrenals/Urinary Tract: Limited evaluation of the adrenal glands. No gross abnormality or hydronephrosis involving the kidneys. Fluid in the urinary bladder. Stomach/Bowel: Large amount of stool in the rectum. Limited evaluation of the bowel structures due to the lack of intra-abdominal fat and lack of oral contrast. Lymphatic: No significant lymph node enlargement in the abdomen or pelvis but limited evaluation. Reproductive: Uterus appears to be present but very limited evaluation. Limited evaluation of the adnexa and ovaries. Other: No free fluid.  Negative for free air. Musculoskeletal: Dextroscoliosis in the lumbar spine. Diffuse subcutaneous edema in the lower extremities particularly below the knees. IMPRESSION: VASCULAR 1. No significant occlusive disease in the abdomen, pelvis or lower extremities. 2. Two vessel runoff disease in the right lower extremity and three vessel runoff disease in left lower extremity. No significant contrast in the ankles or feet but this is probably related to the timing of the study. 3. 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 Stenosis is moderate in severity. NON-VASCULAR 1. Severe centrilobular emphysema. Patchy densities at  the lung bases are nonspecific, refer to the recent chest CT for additional evaluation. 2. Bilateral lower extremity edema. 3. Limited evaluation of the intra-abdominal structures. 4. Aortic Atherosclerosis (ICD10-I70.0) and Emphysema (ICD10-J43.9). Electronically Signed   By: Markus Daft M.D.   On: 08/31/2018 15:57    EKG:   Orders placed or performed during the hospital encounter of 08/31/18  . EKG 12-Lead  . EKG 12-Lead    ASSESSMENT AND PLAN:   77 year old female with past medical history significant for left breast cancer, COPD not on home oxygen, CAD, ongoing smoking, cachexia presents to hospital secondary to worsening shortness of breath.  1.  Acute hypoxic respiratory failure-secondary to COPD exacerbation -Was on BiPAP in stepdown unit. -Wean off BiPAP and placed nasal cannula-not on oxygen at home until this admission. -Appreciate pulmonary consult -On IV steroids, Levaquin and nebs/inhalers -CT chest with angiogram negative for pulmonary embolism but patient has severe emphysema -This seems to be terminal COPD/emphysema along with right-sided heart failure. -Overall prognosis is not very good, will get palliative care consult on Monday.  2.  Elevated troponin-elevated troponin but plateaued and downtrending. -Cardiology consulted.  Currently on heparin drip but denies any chest pain -Echocardiogram shows ejection fraction 65% but significant right-sided dilation and changes. -Recommend aspirin and statin for now -Not a candidate for medical management due to respiratory issues and blood pressure as per cardiologist.  No further work-up is needed.  3.  Hyperkalemia-received Veltassa and corrected potassium   4.  Severe malnutrition/cachexia-dietitian consult Started on Ensure and multivitamin tablets  5.  Depression and anxiety-on Wellbutrin  6.  DVT prophylaxis-currently on heparin drip  7.  Tobacco use disorder-counseled for 4 minutes and offered nicotine  patch.   All the records are reviewed and case discussed with Care Management/Social Workerr. Management plans discussed with the patient, family and they are in agreement.  CODE STATUS: DNR  TOTAL TIME TAKING CARE OF THIS PATIENT: 37 minutes.   POSSIBLE D/C IN 2 DAYS, DEPENDING ON CLINICAL  CONDITION.   Vaughan Basta M.D on 09/02/2018 at 2:14 PM  Between 7am to 6pm - Pager - [phone]  After 6pm go to [email or web address] - password [email or web address] [facility]  Office  [phone]  CC: Primary care physician; Juline Patch, MD

## 2018-09-02 NOTE — Consult Note (Addendum)
[facility] for heparin drip Indication: chest pain/ACS  Allergies  Allergen Reactions  . Penicillin G Other (See Comments)  . Penicillins Hives    Has patient had a PCN reaction causing immediate rash, facial/tongue/throat swelling, SOB or lightheadedness with hypotension: No Has patient had a PCN reaction causing severe rash involving mucus membranes or skin necrosis: No Has patient had a PCN reaction that required hospitalization: No Has patient had a PCN reaction occurring within the last 10 years: No If all of the above answers are "NO", then may proceed with Cephalosporin use.   . Shrimp [Shellfish Allergy] Swelling    Patient Measurements: Height: 5\' 2"  (157.5 cm) Weight: 84 lb (38.1 kg) IBW/kg (Calculated) : 50.1 Heparin Dosing Weight: 38.1kg  Vital Signs: Temp: 98 F (36.7 C) (12/07 1324) Temp Source: Oral (12/07 1324) BP: 115/48 (12/07 1324) Pulse Rate: 112 (12/07 1324)  Labs: Recent Labs    08/31/18 1458 08/31/18 2221 09/01/18 0219 09/01/18 0557  09/01/18 2147 09/02/18 0449 09/02/18 1444  HGB 16.7*  --  14.7  --   --   --  14.0  --   HCT 51.3*  --  44.8  --   --   --  43.7  --   PLT 163  --  153  --   --   --  180  --   APTT 30  --   --   --   --   --   --   --   LABPROT 15.8*  --   --   --   --   --   --   --   INR 1.27  --   --   --   --   --   --   --   HEPARINUNFRC  --   --  <0.10*  --    < > 0.15* 0.17* 0.39  CREATININE 1.92*  --  1.34*  --   --   --  0.96  --   TROPONINI 0.89* 0.77* 0.68* 0.66*  --   --   --   --    < > = values in this interval not displayed.    Estimated Creatinine Clearance: 30.5 mL/min (by C-G formula based on SCr of 0.96 mg/dL).   Medical History: Past Medical History:  Diagnosis Date  . Aortic atherosclerosis ([place])    a. 08/2018 noted on CT  . Breast cancer ([place]) 2004   left lumpectomy  . Cachexia ([place])   . COPD (chronic obstructive pulmonary disease) ([place])   . Coronary artery  calcification seen on CT scan    a. 08/2018 seen on CT.  Marland Kitchen Personal history of radiation therapy   . Post-menopause bleeding   . Right renal artery stenosis ([place])    a. 08/2018 CTA Abd/Lower ext: Moderate R RA stenosis @ origin.  . Tobacco abuse    a. at least 8 y pack history.    Medications:  Scheduled:  . aspirin EC  81 mg Oral Daily  . atorvastatin  40 mg Oral q1800  . budesonide (PULMICORT) nebulizer solution  0.5 mg Nebulization BID  . buPROPion  75 mg Oral BID  . cholecalciferol  1,000 Units Oral Daily  . feeding supplement (ENSURE ENLIVE)  237 mL Oral BID BM  . ipratropium-albuterol  3 mL Nebulization Q6H  . methylPREDNISolone (SOLU-MEDROL) injection  40 mg Intravenous Q12H  . multivitamin with minerals  1 tablet Oral Daily  . nicotine  21 mg Transdermal Daily  . pyridOXINE  200 mg Oral Daily  . vitamin C  250 mg Oral BID    Assessment: Patient is a 77 year old female who presents with hypoxia, LEE, non-palpable pulses to lower extremities. Found to have elevated troponin. Basline CBC WNL. No anticoag PTA seen.  12/6 11:00 Heparin level resulted at <0.10, Heparin infusing at 600 units/hr.   12/07 @ 0500 HL 0.17 subtherapeutic. Rebolus'ed w/ heparin 1200 units IV x 1 and increased rate 900 units/hr   Goal of Therapy:  Heparin level 0.3-0.7 units/ml Monitor platelets by anticoagulation protocol: Yes   Plan:  12/7 @ 1444 0.39 - therapeutic  Will continue current drip rate of 900units/hr Will recheck HL in 8 hours and CBC with am labs  CBC stable will continue to monitor  Lu Duffel, PharmD, BCPS Clinical Pharmacist 09/02/2018 3:16 PM

## 2018-09-03 LAB — CBC
HEMATOCRIT: 43.4 % (ref 36.0–46.0)
Hemoglobin: 13.6 g/dL (ref 12.0–15.0)
MCH: 32.7 pg (ref 26.0–34.0)
MCHC: 31.3 g/dL (ref 30.0–36.0)
MCV: 104.3 fL — ABNORMAL HIGH (ref 80.0–100.0)
Platelets: 182 10*3/uL (ref 150–400)
RBC: 4.16 MIL/uL (ref 3.87–5.11)
RDW: 13.3 % (ref 11.5–15.5)
WBC: 14.5 10*3/uL — ABNORMAL HIGH (ref 4.0–10.5)
nRBC: 0.1 % (ref 0.0–0.2)

## 2018-09-03 LAB — HEPARIN LEVEL (UNFRACTIONATED): Heparin Unfractionated: 0.45 IU/mL (ref 0.30–0.70)

## 2018-09-03 MED ORDER — SODIUM CHLORIDE 0.9% FLUSH
3.0000 mL | Freq: Two times a day (BID) | INTRAVENOUS | Status: DC
  Administered 2018-09-03 – 2018-09-06 (×7): 3 mL via INTRAVENOUS

## 2018-09-03 MED ORDER — SODIUM CHLORIDE 0.9% FLUSH
3.0000 mL | INTRAVENOUS | Status: DC | PRN
  Administered 2018-09-03: 17:00:00 3 mL via INTRAVENOUS
  Filled 2018-09-03: qty 3

## 2018-09-03 MED ORDER — HEPARIN SODIUM (PORCINE) 5000 UNIT/ML IJ SOLN
5000.0000 [IU] | Freq: Two times a day (BID) | INTRAMUSCULAR | Status: DC
  Administered 2018-09-03 – 2018-09-05 (×4): 5000 [IU] via SUBCUTANEOUS
  Filled 2018-09-03 (×5): qty 1

## 2018-09-03 MED ORDER — IPRATROPIUM-ALBUTEROL 0.5-2.5 (3) MG/3ML IN SOLN: 3.0000 mL | Freq: Four times a day (QID) | RESPIRATORY_TRACT | Status: DC | PRN

## 2018-09-03 MED ORDER — IPRATROPIUM-ALBUTEROL 0.5-2.5 (3) MG/3ML IN SOLN
3.0000 mL | Freq: Three times a day (TID) | RESPIRATORY_TRACT | Status: DC
  Administered 2018-09-03 – 2018-09-06 (×12): 3 mL via RESPIRATORY_TRACT
  Filled 2018-09-03 (×12): qty 3

## 2018-09-03 NOTE — Progress Notes (Addendum)
[name] NAME: Gabriella Wiggins    MR#:  [id number]  DATE OF BIRTH:  1941/02/10  SUBJECTIVE:  CHIEF COMPLAINT:   Chief Complaint  Patient presents with  . Leg Swelling  . Shortness of Breath   -Very malnourished patient, laying in bed, was on BiPAP initially and in stepdown now improved and on nasal cannula oxygen. -States she is feeling better. -Still have some cough with gurgling sounds.  REVIEW OF SYSTEMS:  Review of Systems  Constitutional: Positive for malaise/fatigue and weight loss. Negative for chills and fever.  HENT: Negative for congestion, ear discharge, hearing loss and nosebleeds.   Eyes: Negative for blurred vision and double vision.  Respiratory: Positive for shortness of breath. Negative for cough and wheezing.   Cardiovascular: Positive for leg swelling. Negative for chest pain and palpitations.  Gastrointestinal: Negative for abdominal pain, constipation, diarrhea, nausea and vomiting.  Genitourinary: Negative for dysuria.  Musculoskeletal: Negative for myalgias.  Neurological: Negative for dizziness, focal weakness, seizures, weakness and headaches.  Psychiatric/Behavioral: Negative for depression.    DRUG ALLERGIES:   Allergies  Allergen Reactions  . Penicillin G Other (See Comments)  . Penicillins Hives    Has patient had a PCN reaction causing immediate rash, facial/tongue/throat swelling, SOB or lightheadedness with hypotension: No Has patient had a PCN reaction causing severe rash involving mucus membranes or skin necrosis: No Has patient had a PCN reaction that required hospitalization: No Has patient had a PCN reaction occurring within the last 10 years: No If all of the above answers are "NO", then may proceed with Cephalosporin use.   . Shrimp [Shellfish Allergy] Swelling    VITALS:  Blood pressure 118/61, pulse (!) 113, temperature 98.5 F (36.9 C), temperature source Oral, resp. rate  (!) 22, height 5\' 2"  (1.575 m), weight 38.1 kg, SpO2 92 %.  PHYSICAL EXAMINATION:  Physical Exam   GENERAL:  77 y.o.-year-old malnourished patient lying in the bed with no acute distress.  EYES: Pupils equal, round, reactive to light and accommodation. No scleral icterus. Extraocular muscles intact.  HEENT: Head atraumatic, normocephalic. Oropharynx and nasopharynx clear.  NECK:  Supple, no jugular venous distention. No thyroid enlargement, no tenderness.  LUNGS: Normal breath sounds bilaterally, some wheezing, coarse crepitation.  Positive use of accessory muscles of respiration.  CARDIOVASCULAR: S1, S2 normal. No murmurs, rubs, or gallops.  ABDOMEN: Soft, nontender, nondistended. Bowel sounds present. No organomegaly or mass.  EXTREMITIES: No   cyanosis, or clubbing.  2+ pedal edema noted NEUROLOGIC: Cranial nerves II through XII are intact. Muscle strength 4/5 in all extremities. Sensation intact. Gait not checked.  PSYCHIATRIC: The patient is alert and oriented x 3.  SKIN: No obvious rash, lesion, or ulcer.    LABORATORY PANEL:   CBC Recent Labs  Lab 09/03/18 0446  WBC 14.5*  HGB 13.6  HCT 43.4  PLT 182   ------------------------------------------------------------------------------------------------------------------  Chemistries  Recent Labs  Lab 09/02/18 0449  NA 142  K 4.6  CL 99  CO2 36*  GLUCOSE 150*  BUN 64*  CREATININE 0.96  CALCIUM 8.9  MG 2.2  AST 194*  ALT 503*  ALKPHOS 107  BILITOT 0.8   ------------------------------------------------------------------------------------------------------------------  Cardiac Enzymes Recent Labs  Lab 09/01/18 0557  TROPONINI 0.66*   ------------------------------------------------------------------------------------------------------------------  RADIOLOGY:  No results found.  EKG:   Orders placed or performed during the hospital encounter of 08/31/18  . EKG 12-Lead  . EKG 12-Lead  ASSESSMENT AND  PLAN:   77 year old female with past medical history significant for left breast cancer, COPD not on home oxygen, CAD, ongoing smoking, cachexia presents to hospital secondary to worsening shortness of breath.  1.  Acute hypoxic respiratory failure-secondary to COPD exacerbation -Was on BiPAP in stepdown unit. -Wean off BiPAP and placed nasal cannula-not on oxygen at home until this admission. -Appreciate pulmonary consult -On IV steroids, Levaquin and nebs/inhalers -CT chest with angiogram negative for pulmonary embolism but patient has severe emphysema -This seems to be terminal COPD/emphysema along with right-sided heart failure. -Overall prognosis is not very good, will get palliative care consult on Monday. Daughter and Husband had agreed on hospice involvement at home after discharge.  2.  Elevated troponin-elevated troponin but plateaued and downtrending. -Cardiology consulted.  Currently on heparin drip but denies any chest pain -Echocardiogram shows ejection fraction 65% but significant right-sided dilation and changes. -Recommend aspirin and statin for now -Not a candidate for medical management due to respiratory issues and blood pressure as per cardiologist.  No further work-up is needed.  3.  Hyperkalemia-received Veltassa and corrected potassium   4.  Severe malnutrition/cachexia-dietitian consult Started on Ensure and multivitamin tablets  5.  Depression and anxiety-on Wellbutrin  6.  DVT prophylaxis-currently on heparin drip  7.  Tobacco use disorder-counseled for 4 minutes and offered nicotine patch.  8.  Elevated liver enzymes This could be due to right-sided heart failure, continue to monitor. Right upper quadrant ultrasound is done.  Due to overall very poor long-term prognosis, and high risk of recurrent worsening of her COPD and readmissions-patient's family had agreed on getting hospice involvement at home at the time of discharge. I had requested palliative  care consult for tomorrow morning to discuss the plan with family.  All the records are reviewed and case discussed with Care Management/Social Workerr. Management plans discussed with the patient, family and they are in agreement.  CODE STATUS: DNR  TOTAL TIME TAKING CARE OF THIS PATIENT: 37 minutes.   POSSIBLE D/C IN 2 DAYS, DEPENDING ON CLINICAL CONDITION.   Vaughan Basta M.D on 09/03/2018 at 2:00 PM  Between 7am to 6pm - Pager - [phone]  After 6pm go to [email or web address] - password [email or web address] [facility]  Office  [phone]  CC: Primary care physician; Juline Patch, MD

## 2018-09-03 NOTE — Progress Notes (Signed)
Pt weak, frail, DOE on minimal activity even in the bed with respirations moderately labored without acute distress. O2 wean in process with pt unable to tolerate 2l/ with 02 adjusted to 3l/[place] and currently stable.  Drinking ensure with encouragement. Frequent loose, nonproductive cough with very diminished breath sounds particular in the right. Pt and husband educated and encouraged related to tobacco cessation. Heels reddened with pink foams applied, elevated on pillows- educated to avoid pressure to heels.  Dgt in and states pt's nebs treatment had finished so she turned it off; pt had no 02 on with pt and dgt educated to let RN or CRT adjust nebs/oxygen therapy and states she understands; this education was done by RN and CRT with stated understanding.

## 2018-09-03 NOTE — Clinical Social Work Note (Signed)
CSW received a consult that a palliative care consult has been placed. The CSW will follow should needs arise secondary to palliative care.  Santiago Bumpers, MSW, Latanya Presser [phone]

## 2018-09-03 NOTE — Consult Note (Signed)
[place] for heparin drip Indication: chest pain/ACS  Allergies  Allergen Reactions  . Penicillin G Other (See Comments)  . Penicillins Hives    Has patient had a PCN reaction causing immediate rash, facial/tongue/throat swelling, SOB or lightheadedness with hypotension: No Has patient had a PCN reaction causing severe rash involving mucus membranes or skin necrosis: No Has patient had a PCN reaction that required hospitalization: No Has patient had a PCN reaction occurring within the last 10 years: No If all of the above answers are "NO", then may proceed with Cephalosporin use.   . Shrimp [Shellfish Allergy] Swelling    Patient Measurements: Height: 5\' 2"  (157.5 cm) Weight: 84 lb (38.1 kg) IBW/kg (Calculated) : 50.1 Heparin Dosing Weight: 38.1kg  Vital Signs: Temp: 97.5 F (36.4 C) (12/08 0418) Temp Source: Oral (12/08 0418) BP: 100/59 (12/08 0418) Pulse Rate: 40 (12/08 0418)  Labs: Recent Labs    08/31/18 1458 08/31/18 2221 09/01/18 0219 09/01/18 0557  09/02/18 0449 09/02/18 1444 09/02/18 2250 09/03/18 0446  HGB 16.7*  --  14.7  --   --  14.0  --   --  13.6  HCT 51.3*  --  44.8  --   --  43.7  --   --  43.4  PLT 163  --  153  --   --  180  --   --  182  APTT 30  --   --   --   --   --   --   --   --   LABPROT 15.8*  --   --   --   --   --   --   --   --   INR 1.27  --   --   --   --   --   --   --   --   HEPARINUNFRC  --   --  <0.10*  --    < > 0.17* 0.39 0.36 0.45  CREATININE 1.92*  --  1.34*  --   --  0.96  --   --   --   TROPONINI 0.89* 0.77* 0.68* 0.66*  --   --   --   --   --    < > = values in this interval not displayed.    Estimated Creatinine Clearance: 30.5 mL/min (by C-G formula based on SCr of 0.96 mg/dL).   Medical History: Past Medical History:  Diagnosis Date  . Aortic atherosclerosis ([place])    a. 08/2018 noted on CT  . Breast cancer ([place]) 2004   left lumpectomy  . Cachexia ([place])   . COPD (chronic  obstructive pulmonary disease) ([place])   . Coronary artery calcification seen on CT scan    a. 08/2018 seen on CT.  Marland Kitchen Personal history of radiation therapy   . Post-menopause bleeding   . Right renal artery stenosis ([place])    a. 08/2018 CTA Abd/Lower ext: Moderate R RA stenosis @ origin.  . Tobacco abuse    a. at least 31 y pack history.    Medications:  Scheduled:  . aspirin EC  81 mg Oral Daily  . atorvastatin  40 mg Oral q1800  . budesonide (PULMICORT) nebulizer solution  0.5 mg Nebulization BID  . buPROPion  75 mg Oral BID  . cholecalciferol  1,000 Units Oral Daily  . feeding supplement (ENSURE ENLIVE)  237 mL Oral BID BM  . ipratropium-albuterol  3 mL Nebulization TID  .  mouth rinse  15 mL Mouth Rinse BID  . methylPREDNISolone (SOLU-MEDROL) injection  40 mg Intravenous Q12H  . multivitamin with minerals  1 tablet Oral Daily  . nicotine  21 mg Transdermal Daily  . pyridOXINE  200 mg Oral Daily  . vitamin C  250 mg Oral BID    Assessment: Patient is a 77 year old female who presents with hypoxia, LEE, non-palpable pulses to lower extremities. Found to have elevated troponin. Basline CBC WNL. No anticoag PTA seen.  12/6 11:00 Heparin level resulted at <0.10, Heparin infusing at 600 units/hr.   12/07 @ 0500 HL 0.17 subtherapeutic. Rebolus'ed w/ heparin 1200 units IV x 1 and increased rate 900 units/hr   Goal of Therapy:  Heparin level 0.3-0.7 units/ml Monitor platelets by anticoagulation protocol: Yes   Plan:  12/08 @ 0500 HL 0.45 therapeutic. Will continue current rate and will recheck w/ am labs. CBC stable.  Tobie Lords, PharmD, BCPS Clinical Pharmacist 09/03/2018 5:28 AM

## 2018-09-04 LAB — CBC
HCT: 44.6 % (ref 36.0–46.0)
Hemoglobin: 13.7 g/dL (ref 12.0–15.0)
MCH: 33 pg (ref 26.0–34.0)
MCHC: 30.7 g/dL (ref 30.0–36.0)
MCV: 107.5 fL — ABNORMAL HIGH (ref 80.0–100.0)
Platelets: 188 10*3/uL (ref 150–400)
RBC: 4.15 MIL/uL (ref 3.87–5.11)
RDW: 13.5 % (ref 11.5–15.5)
WBC: 13.2 10*3/uL — ABNORMAL HIGH (ref 4.0–10.5)
nRBC: 0.2 % (ref 0.0–0.2)

## 2018-09-04 LAB — HEPATIC FUNCTION PANEL
ALT: 325 U/L — ABNORMAL HIGH (ref 0–44)
AST: 141 U/L — ABNORMAL HIGH (ref 15–41)
Albumin: 2.5 g/dL — ABNORMAL LOW (ref 3.5–5.0)
Alkaline Phosphatase: 116 U/L (ref 38–126)
Bilirubin, Direct: 0.1 mg/dL (ref 0.0–0.2)
Indirect Bilirubin: 0.5 mg/dL (ref 0.3–0.9)
Total Bilirubin: 0.6 mg/dL (ref 0.3–1.2)
Total Protein: 5.7 g/dL — ABNORMAL LOW (ref 6.5–8.1)

## 2018-09-04 MED ORDER — SODIUM CHLORIDE 0.9 % IV SOLN
Freq: Once | INTRAVENOUS | Status: AC
  Administered 2018-09-05: 06:00:00 via INTRAVENOUS

## 2018-09-04 NOTE — Plan of Care (Signed)
  Problem: Education: Goal: Knowledge of General Education information will improve Description: Including pain rating scale, medication(s)/side effects and non-pharmacologic comfort measures Outcome: Progressing   Problem: Clinical Measurements: Goal: Ability to maintain clinical measurements within normal limits will improve Outcome: Progressing Goal: Respiratory complications will improve Outcome: Progressing   

## 2018-09-04 NOTE — Progress Notes (Addendum)
[facility] [place] NAME: Gabriella Wiggins    MR#:  [id number]  DATE OF BIRTH:  1941/05/14  SUBJECTIVE:  CHIEF COMPLAINT:   Chief Complaint  Patient presents with  . Leg Swelling  . Shortness of Breath   -Very malnourished patient, laying in bed, was on BiPAP initially and in stepdown now improved and on nasal cannula oxygen-3 L of oxygen -Patient is getting extremely short of breath while talking laying in the bed.  Husband at bedside They would like to talk to palliative care  REVIEW OF SYSTEMS:  Review of Systems  Constitutional: Positive for malaise/fatigue and weight loss. Negative for chills and fever.  HENT: Negative for congestion, ear discharge, hearing loss and nosebleeds.   Eyes: Negative for blurred vision and double vision.  Respiratory: Positive for shortness of breath. Negative for cough and wheezing.   Cardiovascular: Positive for leg swelling. Negative for chest pain and palpitations.  Gastrointestinal: Negative for abdominal pain, constipation, diarrhea, nausea and vomiting.  Genitourinary: Negative for dysuria.  Musculoskeletal: Negative for myalgias.  Neurological: Negative for dizziness, focal weakness, seizures, weakness and headaches.  Psychiatric/Behavioral: Negative for depression.    DRUG ALLERGIES:   Allergies  Allergen Reactions  . Penicillin G Other (See Comments)  . Penicillins Hives    Has patient had a PCN reaction causing immediate rash, facial/tongue/throat swelling, SOB or lightheadedness with hypotension: No Has patient had a PCN reaction causing severe rash involving mucus membranes or skin necrosis: No Has patient had a PCN reaction that required hospitalization: No Has patient had a PCN reaction occurring within the last 10 years: No If all of the above answers are "NO", then may proceed with Cephalosporin use.   . Shrimp [Shellfish Allergy] Swelling    VITALS:  Blood pressure 130/68, pulse  (!) 116, temperature 98.4 F (36.9 C), resp. rate 20, height '5\' 2"'  (1.575 m), weight 38.1 kg, SpO2 (!) 89 %.  PHYSICAL EXAMINATION:  Physical Exam   GENERAL:  77 y.o.-year-old malnourished patient lying in the bed.  Emaciated EYES: Pupils equal, round, reactive to light and accommodation. No scleral icterus. Extraocular muscles intact.  HEENT: Head atraumatic, normocephalic. Oropharynx and nasopharynx clear.  NECK:  Supple, no jugular venous distention. No thyroid enlargement, no tenderness.  LUNGS: Moderate breath sounds with minimal wheezing, coarse crepitation.  Positive use of accessory muscles of respiration.  CARDIOVASCULAR: S1, S2 normal. No murmurs, rubs, or gallops.  ABDOMEN: Soft, nontender, nondistended. Bowel sounds present.  EXTREMITIES: No   cyanosis, or clubbing.  2+ pedal edema noted NEUROLOGIC: Awake, alert and oriented x3 sensation intact. Gait not checked.  PSYCHIATRIC: The patient is alert and oriented x 3.  SKIN: No obvious rash, lesion, or ulcer.    LABORATORY PANEL:   CBC Recent Labs  Lab 09/04/18 0431  WBC 13.2*  HGB 13.7  HCT 44.6  PLT 188   ------------------------------------------------------------------------------------------------------------------  Chemistries  Recent Labs  Lab 09/02/18 0449 09/04/18 0431  NA 142  --   K 4.6  --   CL 99  --   CO2 36*  --   GLUCOSE 150*  --   BUN 64*  --   CREATININE 0.96  --   CALCIUM 8.9  --   MG 2.2  --   AST 194* 141*  ALT 503* 325*  ALKPHOS 107 116  BILITOT 0.8 0.6   ------------------------------------------------------------------------------------------------------------------  Cardiac Enzymes Recent Labs  Lab 09/01/18 0557  TROPONINI 0.66*   ------------------------------------------------------------------------------------------------------------------  RADIOLOGY:  No results found.  EKG:   Orders placed or performed during the hospital encounter of 08/31/18  . EKG 12-Lead    . EKG 12-Lead    ASSESSMENT AND PLAN:   77 year old female with past medical history significant for left breast cancer, COPD not on home oxygen, CAD, ongoing smoking, cachexia presents to hospital secondary to worsening shortness of breath.  1.  Acute hypoxic respiratory failure-secondary to COPD exacerbation-seems to be at end-stage COPD -Was on BiPAP in stepdown unit.  Weaned off to nasal cannula -Appreciate pulmonary consult -On IV steroids, Levaquin and nebs/inhalers -CT chest with angiogram negative for pulmonary embolism but patient has severe emphysema -Overall prognosis is very poor and patient met with palliative care today -schedule a family meeting tomorrow at 10 AM  2.  Elevated troponin-elevated troponin but plateaued and downtrending. -Cardiology consulted.   Off heparin drip denies any chest pain -Echocardiogram shows ejection fraction 65% but significant right-sided dilation and changes. -Recommend aspirin and statin for now -Not a candidate for medical management due to respiratory issues and blood pressure as per cardiologist.  No further work-up is needed.  3.  Hyperkalemia-received Veltassa and corrected potassium   4.  Severe malnutrition/cachexia-dietitian consult Started on Ensure and multivitamin tablets  5.  Depression and anxiety-on Wellbutrin  6.  DVT prophylaxis- Heparin  subcu  7.  Tobacco use disorder-counseled for 4 minutes and offered nicotine patch.  8.  Elevated liver enzymes This could be due to right-sided heart failure, continue to monitor. Right upper quadrant ultrasound is done.  Due to overall very poor long-term prognosis, and high risk of mortality recurrent worsening of her COPD and readmissions-patient's family had agreed on meeting with palliative care.  Scheduled for 10 AM tomorrow  All the records are reviewed and case discussed with Care Management/Social Workerr. Management plans discussed with the patient, family and they are  in agreement.  CODE STATUS: DNR  TOTAL TIME TAKING CARE OF THIS PATIENT: 35 minutes.   POSSIBLE D/C IN 1-2 DAYS, DEPENDING ON CLINICAL CONDITION.   Nicholes Mango M.D on 09/04/2018 at 3:56 PM  Between 7am to 6pm - Pager - [phone]  After 6pm go to [email or web address] - password [email or web address] [facility]  Office  [phone]  CC: Primary care physician; Juline Patch, MD

## 2018-09-04 NOTE — Progress Notes (Signed)
PALLIATIVE NOTE:  Referral received for goals of care discussion. I spoke with husband, Hala Narula and introduced myself and Palliative. He verbalized understanding and appreciation. Husband left to go home to rest and wait for his daughter to come up with him to visit later. He expressed his daughter generally gets off around 4 or 5. Advised I would not be available at 5pm however, would like to meet with him and daughter if she was able to make arrangements during her lunch time maybe.   Mr. Climer expressed he would discuss with her later this afternoon and get back with me if she is able to. In the mean time he has requested to meet tomorrow morning 12/10 @ 10 am for [place] discussion even if daughter is unavailable, with plans to update her via phone.   Detailed note and recommendations to follow.   Advised husband I will leave my number on patient's board in the room, for daughter to be able to contact me after she arrives later this evening.   Thank you for allowing [place] the opportunity to assist in patient's care.   Alda Lea, AGPCNP-BC Palliative Medicine Team  Phone: [phone] Fax: [phone] Pager: [phone] Amion: Delane Ginger. [place]

## 2018-09-04 NOTE — Consult Note (Signed)
Pharmacy Antibiotic Note  Gabriella Wiggins is a 77 y.o. female admitted on 08/31/2018 with pneumonia.  PMH of COPD. Pharmacy has been consulted for Levofloxacin dosing.  Plan: Ordered Levofloxacin 750 mg q48h.  Height: 5\' 2"  (157.5 cm) Weight: 84 lb (38.1 kg) IBW/kg (Calculated) : 50.1  Temp (24hrs), Avg:97.9 F (36.6 C), Min:97.5 F (36.4 C), Max:98.5 F (36.9 C)  Recent Labs  Lab 08/31/18 1458 09/01/18 0219 09/02/18 0449 09/03/18 0446 09/04/18 0431  WBC 11.0* 11.9* 13.9* 14.5* 13.2*  CREATININE 1.92* 1.34* 0.96  --   --     Estimated Creatinine Clearance: 30.5 mL/min (by C-G formula based on SCr of 0.96 mg/dL).    Allergies  Allergen Reactions  . Penicillin G Other (See Comments)  . Penicillins Hives    Has patient had a PCN reaction causing immediate rash, facial/tongue/throat swelling, SOB or lightheadedness with hypotension: No Has patient had a PCN reaction causing severe rash involving mucus membranes or skin necrosis: No Has patient had a PCN reaction that required hospitalization: No Has patient had a PCN reaction occurring within the last 10 years: No If all of the above answers are "NO", then may proceed with Cephalosporin use.   . Shrimp [Shellfish Allergy] Swelling    Antimicrobials this admission: 12/5 Levofloxacin >>    Dose adjustments this admission: N/A  Microbiology results:   Thank you for allowing pharmacy to be a part of this patient's care.   Forrest Moron, PharmD 09/04/2018 8:16 AM

## 2018-09-04 NOTE — Care Management Important Message (Signed)
Important Message  Patient Details  Name: Gabriella Wiggins MRN: [id number] Date of Birth: 1941-04-24   Medicare Important Message Given:  Yes    [name] 09/04/2018, 10:15 AM

## 2018-09-04 NOTE — Evaluation (Signed)
Clinical/Bedside Swallow Evaluation Patient Details  Name: Gabriella Wiggins MRN: [id number] Date of Birth: [birth year]  Today's Date: 09/04/2018 Time: SLP Start Time (ACUTE ONLY): 0914 SLP Stop Time (ACUTE ONLY): 0940 SLP Time Calculation (min) (ACUTE ONLY): 26 min  Past Medical History:  Past Medical History:  Diagnosis Date  . Aortic atherosclerosis ([place])    a. 08/2018 noted on CT  . Breast cancer ([place]) 2004   left lumpectomy  . Cachexia ([place])   . COPD (chronic obstructive pulmonary disease) ([place])   . Coronary artery calcification seen on CT scan    a. 08/2018 seen on CT.  Marland Kitchen Personal history of radiation therapy   . Post-menopause bleeding   . Right renal artery stenosis ([place])    a. 08/2018 CTA Abd/Lower ext: Moderate R RA stenosis @ origin.  . Tobacco abuse    a. at least 55 y pack history.   Past Surgical History:  Past Surgical History:  Procedure Laterality Date  . APPENDECTOMY    . bilateral tubal    . BREAST BIOPSY Right    milk duct removed -neg  . BREAST BIOPSY Left 2004   lumpectomy/rad  . BREAST LUMPECTOMY Left   . DILATATION & CURETTAGE/HYSTEROSCOPY WITH MYOSURE N/A 06/20/2017   Procedure: DILATATION & CURETTAGE/HYSTEROSCOPY WITH MYOSURE;  Surgeon: Schermerhorn, Gwen Her, MD;  Location: [facility] ORS;  Service: Gynecology;  Laterality: N/A;  . HYSTEROSCOPY W/D&C N/A 06/20/2017   Procedure: DILATATION AND CURETTAGE /HYSTEROSCOPY;  Surgeon: Schermerhorn, Gwen Her, MD;  Location: [facility] ORS;  Service: Gynecology;  Laterality: N/A;  . OVARIAN CYST SURGERY     HPI:  Per admitting H&P: hospital due to worsening shortness of breath and lower extremity edema.  Patient says that she has had significant swelling in her lower extremity progressively getting worse over the past 2 months and also worsening exertional dyspnea.  She has a long-standing history of tobacco abuse but currently is not on oxygen at home.  She has no history of congestive heart failure or any previous history of  heart disease.  She is currently not on diuretics.  She denies any paroxysmal nocturnal dyspnea orthopnea.  She presents to the ER was noted to be significantly hypoxic with O2 sats in the mid 70s on room air and noted to be in acute respiratory failure with hypoxia likely secondary to COPD exacerbation.  Hospitalist services were contacted for admission.  Incidentally patient was also noted to be in acute kidney injury with mild hyperkalemia and elevated troponin.  Patient does admit to a cough which is productive with gray-colored sputum but denies any hemoptysis, fever chills, nausea vomiting, chest pain, abdominal pain or any other associated symptoms presently.   Assessment / Plan / Recommendation Clinical Impression  Patient presents with functional swallowing abilities at bedside with no overt s/s aspiration with either consistency tested (thin liquid, solid). Oral phase c/b adequate oral prep/coordination and timely A-P transit with both sips of thin liquid by cup and small bites of solid. No oral residue noted with bites of solid. Oral mech exam revealed all structures WFL. Swallow initiation appeared timely. Vocal quality remained clear throughout evaluation. No apparent overt s/s pharyngeal dysphagia at bedside. Pt and husband report toleration of regular diet with thin liquid with no s/s aspiration, and report "she has never had difficulty with her swallowing." Recommend continue with current regular diet with thin liquid, given aspiration precautions, may give meds whole with thin liquid. SLP to f/u with toleration of diet 1-3 days.  SLP  Visit Diagnosis: Dysphagia, unspecified (R13.10)    Aspiration Risk  Mild aspiration risk    Diet Recommendation Regular;Thin liquid   Liquid Administration via: Cup Medication Administration: Whole meds with liquid Supervision: Patient able to self feed Compensations: Minimize environmental distractions;Slow rate;Small sips/bites Postural Changes:  Seated upright at 90 degrees    Other  Recommendations Oral Care Recommendations: Oral care QID   Follow up Recommendations None      Frequency and Duration min 2x/week  1 week       Prognosis Prognosis for Safe Diet Advancement: Good      Swallow Study   General Date of Onset: 09/04/18 HPI: Per admitting H&P: hospital due to worsening shortness of breath and lower extremity edema.  Patient says that she has had significant swelling in her lower extremity progressively getting worse over the past 2 months and also worsening exertional dyspnea.  She has a long-standing history of tobacco abuse but currently is not on oxygen at home.  She has no history of congestive heart failure or any previous history of heart disease.  She is currently not on diuretics.  She denies any paroxysmal nocturnal dyspnea orthopnea.  She presents to the ER was noted to be significantly hypoxic with O2 sats in the mid 70s on room air and noted to be in acute respiratory failure with hypoxia likely secondary to COPD exacerbation.  Hospitalist services were contacted for admission.  Incidentally patient was also noted to be in acute kidney injury with mild hyperkalemia and elevated troponin.  Patient does admit to a cough which is productive with gray-colored sputum but denies any hemoptysis, fever chills, nausea vomiting, chest pain, abdominal pain or any other associated symptoms presently. Type of Study: Bedside Swallow Evaluation Diet Prior to this Study: Regular;Thin liquids Temperature Spikes Noted: No Respiratory Status: Nasal cannula History of Recent Intubation: No Behavior/Cognition: Alert;Cooperative;Pleasant mood Oral Cavity Assessment: Dried secretions Oral Care Completed by SLP: No Oral Cavity - Dentition: Adequate natural dentition Vision: Functional for self-feeding Self-Feeding Abilities: Able to feed self Patient Positioning: Upright in bed Baseline Vocal Quality: Hoarse;Low vocal  intensity Volitional Swallow: Able to elicit    Oral/Motor/Sensory Function Overall Oral Motor/Sensory Function: Within functional limits   Ice Chips Ice chips: Not tested   Thin Liquid Thin Liquid: Within functional limits Presentation: Cup;Self Fed    Nectar Thick Nectar Thick Liquid: Not tested   Honey Thick Honey Thick Liquid: Not tested   Puree Puree: Not tested   Solid     Solid: Within functional limits Presentation: Self Fed      Rilee Wendling, MA, CCC-SLP 09/04/2018,9:58 AM

## 2018-09-05 DIAGNOSIS — E43 Unspecified severe protein-calorie malnutrition: Secondary | ICD-10-CM

## 2018-09-05 DIAGNOSIS — R0602 Shortness of breath: Secondary | ICD-10-CM

## 2018-09-05 DIAGNOSIS — R7989 Other specified abnormal findings of blood chemistry: Secondary | ICD-10-CM

## 2018-09-05 MED ORDER — LORAZEPAM 0.5 MG PO TABS: 0.2500 mg | ORAL_TABLET | Freq: Three times a day (TID) | ORAL | Status: DC | PRN

## 2018-09-05 MED ORDER — MORPHINE SULFATE (CONCENTRATE) 10 MG/0.5ML PO SOLN: 2.5000 mg | ORAL | Status: DC | PRN

## 2018-09-05 NOTE — Consult Note (Signed)
Consultation Note Date: 09/05/2018   Patient Name: Gabriella Wiggins  DOB: 1940-12-28  MRN: [id number]  Age / Sex: 77 y.o., female  PCP: Gabriella Patch, MD Referring Physician: Nicholes Mango, MD  Reason for Consultation: Establishing goals of care  HPI/Patient Profile: 77 y.o. female  admitted on 08/31/2018 from home with complaints of bilateral leg swelling and shortness of breath. She has a past medical history of ongoing tobacco use, COPD, breast cancer (s/p left lumpectomy 2004), aortic atherosclerosis, and right renal artery stenosis. On presentation patient reported significant swelling her bilateral lower extremities which has worsened over the past 2 months. She also complained of exertional dyspnea which has worsened. During her ED course she was noted to have significant hypoxia with oxygen saturations in the mid 70s on room air and acute respiratory failure. She has a productive cough with gray-colored sputum. She denied hemoptysis, fever, chills, chest pain, or nausea/vomiting. K 6.1, NA 136, BUN 99. CR 1.92, GFR 25, troponin 0.89, BNP 2511.0, WBC 11.0, Hgb 16.7. CT angio of chest showed COPD with fibrotic changes, mild basilar atelectasis, some areas of mucous plugging on the left lower lobe, chronic compression deformity at T3. She was placed on BiPAP for respiratory support and started on IV antibiotics and steroids. Since admission patient continues with poor po intake, shortness of breath requiring supplemental oxygen, able to wean from BiPAP. Echo showed EF 65% with significant right-sided dilation and changes. Patient has been seen by dietician due to severe malnutrition and started on ensure and multivitamin. Palliative Medicine team consulted for goals of care.   Clinical Assessment and Goals of Care: I have reviewed medical records including lab results, imaging, Epic notes, and MAR, received report from  the bedside RN, and assessed the patient. I then met at the bedside with patient and her husband, Gabriella Wiggins to discuss diagnosis prognosis, GOC, EOL wishes, disposition and options. Patient has flat affect but is is A&O x3. She engaged in goals of care discussion.   I introduced Palliative Medicine as specialized medical care for people living with serious illness. It focuses on providing relief from the symptoms and stress of a serious illness. The goal is to improve quality of life for both the patient and the family.  We discussed a brief life review of the patient. 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 She and her husband have been married for 48 years and have 3 children. Unfortunately they only have 1 surviving daughter as both of their sons have passed away, with the youngest passing away just this past September due to health complications. Patient reports they are of [place]. She enjoyed spending time with her family and reading.   As far as functional and nutritional status prior to admission patient reports she was ambulatory with assistance. She would use a walker at times or wheelchair but generally remained in chair or bed. Husband states she has struggled with her health over the past year however, she has suffered more  of a decline since September. He states patient had a drastic decrease in appetite, willingness to engage socially, or will to live. Patient states she doesn't have much of an appetite or strength. Husband was assisting with ADLs prior to admission. Patient is cachetic and severely malnourished. Husband states patient weighed less than 84lbs at admission and several weeks prior she weighted 96lb. Patient stated she has always been a thin lady, however she weighed 120-130's earlier in the year.   We discussed her current illness and what it means in the larger context of her on-going co-morbidities.  Natural disease trajectory and  expectations at EOL were discussed. We discussed specifically patient's COPD, cardiac, dehydration/kidneys, and overall nutritional and functional state. Patient and husband both verbalized understanding. [name] reports she is tired and has no appetite. I asked her to elaborate on her statement of being tired. Patient expressed "I am tired physically and mentally. I am ready to go when it is my time and I think that is really soon!" Husband was tearful yet supportive. He verbalized his goal was to make sure that she was comfortable and at peace. He expressed his daughter was also in agreement.   Patient states she has not felt well for months now, even prior to September however, she just hasn't really said much to her family.   I attempted to elicit values and goals of care important to the patient.    The difference between aggressive medical intervention and comfort care was considered in light of the patient's goals of care. At this time patient and her husband expressed wishes to be kept comfortable and to return their home. I educated patient and husband what comfort care would look like. They are aware that patient would not continue to have medical interventions such as lab work, radiology testing, IV fluids, rehospitalizations, or medications not focused on comfort. Patient will receive care in the hospital and at home for comfort and management of symptoms such as pain, shortness of breath, secretions, nausea, anxiety etc. Both patient and husband verbalized understanding and appreciation of detailed explanation.   Patient confirms DNR/DNI, no artificial feedings such as PEG, and wishes not to return to the hospital. Husband verbalized his agreement to patient wishes.   I discussed both Hospice and Palliative Care services outpatient to patient and her husband. Given patient's expressed wishes to return home for comfort and to be amongst her family and friends she would be most appropriate  for hospice services, which is what they are requesting.   Patient and husband requesting outpatient services in the home with the need for equipment such as hospital bed and oxygen. Patient asking to keep foley for support and to decrease need for husband and family. Discussed process of referral and contact from case manager. Patient and husband verbalized understanding.   Questions and concerns were addressed. The family was encouraged to call with questions or concerns.  PMT will continue to support holistically.  Primary Decision Maker: PATIENT is A&O x3 at this time.     SUMMARY OF RECOMMENDATIONS    DNR/DNI-as confirmed by family  Family requesting to return home with hospice for comfort measures. We discussed medications and care of hospice in the home. Husband and patient expressed wishes to return with support of their daughter.   Case Management referral for outpatient hospice and equipment needs (hospital bed/oxygen)  Family request to continue current treatment until discharged. Would like to continue with steroids/antibiocs at this time. [place] for other  medications (vitamins, tele, heparin to be d/c at this time)  Roxanol PRN for pain/shortness of breath  Ativan PRN for anxiety  Palliative Medicine team will continue to support and follow.   Code Status/Advance Care Planning:  DNR   Symptom Management:   Roxanol PRN for pain/shortness of breath  Ativan PRN for anxiety  Palliative Prophylaxis:   Aspiration, Bowel Regimen, Delirium Protocol and Frequent Pain Assessment  Additional Recommendations (Limitations, Scope, Preferences):  Full Scope Treatment, No Artificial Feeding, No Diagnostics and No Lab Draws  Psycho-social/Spiritual:   Desire for further Chaplaincy support:NO   Prognosis:   < 6 months- in the setting of acute respiratory failure with hypoxia, severe protein calorie malnutrition, weight loss >10% (120lb now 84lb), cachetic, poor po intake,  decreased mobility, generalized weakness, COPD, emphysema, shortness of breath, 4L/[place], depression, tobacco use, elevated liver enzymes.   Discharge Planning: Home with Hospice      Primary Diagnoses: Present on Admission: . Acute respiratory failure with hypoxia ([place])   I have reviewed the medical record, interviewed the patient and family, and examined the patient. The following aspects are pertinent.  Past Medical History:  Diagnosis Date  . Aortic atherosclerosis ([place])    a. 08/2018 noted on CT  . Breast cancer ([place]) 2004   left lumpectomy  . Cachexia ([place])   . COPD (chronic obstructive pulmonary disease) ([place])   . Coronary artery calcification seen on CT scan    a. 08/2018 seen on CT.  Marland Kitchen Personal history of radiation therapy   . Post-menopause bleeding   . Right renal artery stenosis ([place])    a. 08/2018 CTA Abd/Lower ext: Moderate R RA stenosis @ origin.  . Tobacco abuse    a. at least 36 y pack history.   Social History   Socioeconomic History  . Marital status: Married    Spouse name: Not on file  . Number of children: 2  . Years of education: Not on file  . Highest education level: Not on file  Occupational History  . Occupation: retired  Scientific laboratory technician  . Financial resource strain: Not hard at all  . Food insecurity:    Worry: Never true    Inability: Never true  . Transportation needs:    Medical: No    Non-medical: No  Tobacco Use  . Smoking status: Current Every Day Smoker    Packs/day: 1.00    Years: 55.00    Pack years: 55.00    Types: Cigarettes  . Smokeless tobacco: Never Used  Substance and Sexual Activity  . Alcohol use: No    Alcohol/week: 0.0 standard drinks  . Drug use: No  . Sexual activity: Never  Lifestyle  . Physical activity:    Days per week: 0 days    Minutes per session: 0 min  . Stress: Not at all  Relationships  . Social connections:    Talks on phone: More than three times a week    Gets together: Three times a week     Attends religious service: 1 to 4 times per year    Active member of club or organization: Yes    Attends meetings of clubs or organizations: More than 4 times per year    Relationship status: Married  Other Topics Concern  . Not on file  Social History Narrative   Lives in [place] with her husband.  Does not routinely exercise.   Family History  Problem Relation Age of Onset  . Heart disease Father   .  Breast cancer Sister   . Diabetes Brother   . Breast cancer Maternal Aunt   . Breast cancer Paternal Aunt   . Breast cancer Cousin        cousins-both sides  . Cirrhosis Mother   . Bladder Cancer Sister   . Diabetes Brother   . Parkinson's disease Brother   . Healthy Sister   . Heart disease Sister   . Dementia Sister   . Arthritis Sister   . Breast cancer Sister   . Healthy Brother   . Healthy Brother   . Arthritis Brother   . Colon cancer Cousin    Scheduled Meds: . aspirin EC  81 mg Oral Daily  . atorvastatin  40 mg Oral q1800  . budesonide (PULMICORT) nebulizer solution  0.5 mg Nebulization BID  . buPROPion  75 mg Oral BID  . feeding supplement (ENSURE ENLIVE)  237 mL Oral BID BM  . heparin injection (subcutaneous)  5,000 Units Subcutaneous Q12H  . ipratropium-albuterol  3 mL Nebulization TID  . mouth rinse  15 mL Mouth Rinse BID  . methylPREDNISolone (SOLU-MEDROL) injection  40 mg Intravenous Q12H  . nicotine  21 mg Transdermal Daily  . sodium chloride flush  3 mL Intravenous Q12H   Continuous Infusions: . levofloxacin (LEVAQUIN) IV Stopped (09/04/18 1940)   PRN Meds:.acetaminophen **OR** acetaminophen, ipratropium-albuterol, ondansetron **OR** ondansetron (ZOFRAN) IV, sodium chloride flush Medications Prior to Admission:  Prior to Admission medications   Medication Sig Start Date End Date Taking? Authorizing Provider  aspirin EC 81 MG tablet Take 81 mg by mouth daily.   Yes [provider]  buPROPion (WELLBUTRIN) 75 MG tablet Take 1 tablet (75 mg  total) by mouth 2 (two) times daily. 06/06/18  Yes Gabriella Patch, MD  carboxymethylcellulose (REFRESH PLUS) 0.5 % SOLN Place 1 drop into both eyes 2 (two) times daily as needed (dry eyes).   Yes [provider]  cholecalciferol (VITAMIN D) 1000 units tablet Take 1 tablet (1,000 Units total) by mouth daily. 06/06/18  Yes Gabriella Patch, MD  Ipratropium-Albuterol (COMBIVENT RESPIMAT) 20-100 MCG/ACT AERS respimat Inhale 1 puff into the lungs 2 (two) times daily as needed for wheezing or shortness of breath. 06/06/18  Yes Gabriella Patch, MD  OVER THE COUNTER MEDICATION Apply 1 application topically as needed (CBD Cream).   Yes [provider]  PROAIR HFA 108 (90 Base) MCG/ACT inhaler Inhale 1-2 puffs into the lungs every 6 (six) hours as needed. 06/06/18  Yes Gabriella Patch, MD  pyridOXINE (VITAMIN B-6) 100 MG tablet Take 200 mg by mouth daily.    Yes [provider]  Tiotropium Bromide-Olodaterol (STIOLTO RESPIMAT) 2.5-2.5 MCG/ACT AERS Inhale 1 Inhaler into the lungs 2 (two) times daily. 06/06/18  Yes Gabriella Patch, MD   Allergies  Allergen Reactions  . Penicillin G Other (See Comments)  . Penicillins Hives    Has patient had a PCN reaction causing immediate rash, facial/tongue/throat swelling, SOB or lightheadedness with hypotension: No Has patient had a PCN reaction causing severe rash involving mucus membranes or skin necrosis: No Has patient had a PCN reaction that required hospitalization: No Has patient had a PCN reaction occurring within the last 10 years: No If all of the above answers are "NO", then may proceed with Cephalosporin use.   Marland Kitchen Shrimp [Shellfish Allergy] Swelling   Review of Systems  Constitutional: Positive for activity change, appetite change, fatigue and unexpected weight change.  Respiratory: Positive for shortness of breath.  Cardiovascular: Positive for leg swelling.  Neurological: Positive for weakness.    Physical Exam    Constitutional: She is oriented to person, place, and time. She appears cachectic. She is cooperative. She has a sickly appearance.  Thin, frail, malnourished   Cardiovascular: Normal heart sounds. Tachycardia present. Exam reveals decreased pulses.  Pulmonary/Chest: She has decreased breath sounds. She has rhonchi.  Shortness of breath, 4L/[place]   Abdominal: Soft. Normal appearance. Bowel sounds are decreased.  Musculoskeletal:  Weakness   Neurological: She is alert and oriented to person, place, and time. She displays atrophy.  Skin: Skin is warm and dry. Bruising noted.  Scattered bruising, thin, bony prominences   Psychiatric: Her speech is normal. Judgment and thought content normal. Cognition and memory are normal.  Flat affect   Nursing note and vitals reviewed.   Vital Signs: BP 133/70 (BP Location: Right Arm)   Pulse (!) 108   Temp 97.7 F (36.5 C) (Oral)   Resp 18   Ht 5' 2" (1.575 m)   Wt 38.1 kg   SpO2 98%   BMI 15.36 kg/m  Pain Scale: 0-10   Pain Score: 0-No pain   SpO2: SpO2: 98 % O2 Device:SpO2: 98 % O2 Flow Rate: .O2 Flow Rate (L/min): 4 L/min  IO: Intake/output summary:   Intake/Output Summary (Last 24 hours) at 09/05/2018 1037 Last data filed at 09/05/2018 0700 Gross per 24 hour  Intake 63.71 ml  Output 1300 ml  Net -1236.29 ml    LBM: Last BM Date: (per pt "few days ago" its normal for her) Baseline Weight: Weight: 38.1 kg Most recent weight: Weight: 38.1 kg     Palliative Assessment/Data: PPS 20%   Flowsheet Rows     Most Recent Value  Intake Tab  Referral Department  Hospitalist  Unit at Time of Referral  Med/Surg Unit  Palliative Care Primary Diagnosis  Pulmonary  Date Notified  09/03/18  Palliative Care Type  New Palliative care  Reason for referral  Clarify Goals of Care, Counsel Regarding Hospice  Date of Admission  08/31/18  # of days IP prior to Palliative referral  3  Clinical Assessment  Psychosocial & Spiritual Assessment   Palliative Care Outcomes      Time In: 1000 Time Out: 1115 Time Total: 75 min  Greater than 50%  of this time was spent counseling and coordinating care related to the above assessment and plan.  Signed by:  Alda Lea, AGPCNP-BC Palliative Medicine Team  Phone: [phone] Fax: [phone] Pager: [phone] Amion: Bjorn Pippin    Please contact Palliative Medicine Team phone at [phone] for questions and concerns.  For individual provider: See Shea Evans

## 2018-09-05 NOTE — Progress Notes (Signed)
[facility] [place] NAME: Gabriella Wiggins    MR#:  [id number]  DATE OF BIRTH:  March 30, 1941  SUBJECTIVE:  CHIEF COMPLAINT:   Chief Complaint  Patient presents with  . Leg Swelling  . Shortness of Breath   -Very malnourished patient, laying in bed, was on BiPAP initially and in stepdown now improved and on nasal cannula oxygen-3 L of oxygen -Patient is getting extremely short of breath while talking laying in the bed.  Husband at bedside, had a meeting with palliative care today and agreeable with home with hospice care   REVIEW OF SYSTEMS:  Review of Systems  Constitutional: Positive for malaise/fatigue and weight loss. Negative for chills and fever.  HENT: Negative for congestion, ear discharge, hearing loss and nosebleeds.   Eyes: Negative for blurred vision and double vision.  Respiratory: Positive for shortness of breath. Negative for cough and wheezing.   Cardiovascular: Positive for leg swelling. Negative for chest pain and palpitations.  Gastrointestinal: Negative for abdominal pain, constipation, diarrhea, nausea and vomiting.  Genitourinary: Negative for dysuria.  Musculoskeletal: Negative for myalgias.  Neurological: Negative for dizziness, focal weakness, seizures, weakness and headaches.  Psychiatric/Behavioral: Negative for depression.    DRUG ALLERGIES:   Allergies  Allergen Reactions  . Penicillin G Other (See Comments)  . Penicillins Hives    Has patient had a PCN reaction causing immediate rash, facial/tongue/throat swelling, SOB or lightheadedness with hypotension: No Has patient had a PCN reaction causing severe rash involving mucus membranes or skin necrosis: No Has patient had a PCN reaction that required hospitalization: No Has patient had a PCN reaction occurring within the last 10 years: No If all of the above answers are "NO", then may proceed with Cephalosporin use.   . Shrimp [Shellfish Allergy] Swelling     VITALS:  Blood pressure 124/68, pulse (!) 108, temperature 97.7 F (36.5 C), temperature source Oral, resp. rate 18, height _0  (1.575 m), weight 38.1 kg, SpO2 99 %.  PHYSICAL EXAMINATION:  Physical Exam   GENERAL:  77 y.o.-year-old malnourished patient lying in the bed.  Emaciated EYES: Pupils equal, round, reactive to light and accommodation. No scleral icterus. Extraocular muscles intact.  HEENT: Head atraumatic, normocephalic. Oropharynx and nasopharynx clear.  NECK:  Supple, no jugular venous distention. No thyroid enlargement, no tenderness.  LUNGS: Moderate breath sounds with minimal wheezing, coarse crepitation.  Positive use of accessory muscles of respiration.  CARDIOVASCULAR: S1, S2 normal. No murmurs, rubs, or gallops.  ABDOMEN: Soft, nontender, nondistended. Bowel sounds present.  EXTREMITIES: No   cyanosis, or clubbing.  2+ pedal edema noted NEUROLOGIC: Awake, alert and oriented x3 sensation intact. Gait not checked.  PSYCHIATRIC: The patient is alert and oriented x 3.  SKIN: No obvious rash, lesion, or ulcer.    LABORATORY PANEL:   CBC Recent Labs  Lab 09/04/18 0431  WBC 13.2*  HGB 13.7  HCT 44.6  PLT 188   ------------------------------------------------------------------------------------------------------------------  Chemistries  Recent Labs  Lab 09/02/18 0449 09/04/18 0431  NA 142  --   K 4.6  --   CL 99  --   CO2 36*  --   GLUCOSE 150*  --   BUN 64*  --   CREATININE 0.96  --   CALCIUM 8.9  --   MG 2.2  --   AST 194* 141*  ALT 503* 325*  ALKPHOS 107 116  BILITOT 0.8 0.6   ------------------------------------------------------------------------------------------------------------------  Cardiac Enzymes Recent Labs  Lab 09/01/18 0557  TROPONINI 0.66*   ------------------------------------------------------------------------------------------------------------------  RADIOLOGY:  No results found.  EKG:   Orders placed or  performed during the hospital encounter of 08/31/18  . EKG 12-Lead  . EKG 12-Lead    ASSESSMENT AND PLAN:   77 year old female with past medical history significant for left breast cancer, COPD not on home oxygen, CAD, ongoing smoking, cachexia presents to hospital secondary to worsening shortness of breath.  1.  Acute hypoxic respiratory failure-secondary to COPD exacerbation-seems to be at end-stage COPD -Was on BiPAP in stepdown unit.  Weaned off to nasal cannula -Appreciate pulmonary consult -On IV steroids, Levaquin and nebs/inhalers -CT chest with angiogram negative for pulmonary embolism but patient has severe emphysema -Overall prognosis is very poor and patient met with palliative care today -schedule a family meeting tomorrow at 10 AM Foley catheter placed as per patient request as she gets extremely short of breath walking to the bathroom-for comfort care  2.  Elevated troponin-elevated troponin but plateaued and downtrending. -Cardiology consulted.   Off heparin drip denies any chest pain -Echocardiogram shows ejection fraction 65% but significant right-sided dilation and changes. -Recommend aspirin and statin for now -Not a candidate for medical management due to respiratory issues and blood pressure as per cardiologist.  No further work-up is needed.  3.  Hyperkalemia-received Veltassa and corrected potassium   4.  Severe malnutrition/cachexia-dietitian consult Started on Ensure and multivitamin tablets  5.  Depression and anxiety-on Wellbutrin  6.  DVT prophylaxis- Heparin  subcu  7.  Tobacco use disorder-counseled for 4 minutes and offered nicotine patch.  8.  Elevated liver enzymes This could be due to right-sided heart failure, continue to monitor. Right upper quadrant ultrasound is done.  Due to overall very poor long-term prognosis, and high risk of mortality recurrent worsening of her COPD and readmissions-patient's family had agreed and met with palliative  care today.  CODE STATUS DNR and plan is to discharge patient with home with hospice care for comfort measures tomorrow  All the records are reviewed and case discussed with Care Management/Social Workerr. Management plans discussed with the patient, husband and they are in agreement.  CODE STATUS: DNR  TOTAL TIME TAKING CARE OF THIS PATIENT: 25 minutes.   POSSIBLE D/C IN 1- DAYS, DEPENDING ON CLINICAL CONDITION.   Nicholes Mango M.D on 09/05/2018 at 4:30 PM  Between 7am to 6pm - Pager - [phone]  After 6pm go to [email or web address] - password [email or web address] [facility]  Office  [phone]  CC: Primary care physician; Juline Patch, MD

## 2018-09-05 NOTE — Care Management (Addendum)
Patient and family have chosen for patient to discharge home with hospice. Agency preference is [facility].  Called referral to [facility] and aware that there is need for hospital bed, walker, oxygen, BSC.  Reinforced that the hospital be and oxygen must be in place before patient can discharge. Patient will have to transport home by ems.  Anticipate patient will discharge 12/11. EMS forms have been completed.

## 2018-09-06 ENCOUNTER — Ambulatory Visit: Payer: PPO | Admitting: Family Medicine

## 2018-09-06 MED ORDER — LEVOFLOXACIN 750 MG PO TABS: 750.0000 mg | ORAL_TABLET | ORAL | 0 refills | Status: AC

## 2018-09-06 MED ORDER — NICOTINE 21 MG/24HR TD PT24: 21.0000 mg | MEDICATED_PATCH | Freq: Every day | TRANSDERMAL | 0 refills | Status: DC

## 2018-09-06 MED ORDER — ATORVASTATIN CALCIUM 40 MG PO TABS: 40.0000 mg | ORAL_TABLET | Freq: Every day | ORAL | 0 refills | Status: DC

## 2018-09-06 MED ORDER — LORAZEPAM 0.5 MG PO TABS: 0.2500 mg | ORAL_TABLET | Freq: Three times a day (TID) | ORAL | 0 refills | Status: DC | PRN

## 2018-09-06 MED ORDER — ENSURE ENLIVE PO LIQD: 237.0000 mL | Freq: Two times a day (BID) | ORAL | 12 refills | Status: DC

## 2018-09-06 MED ORDER — LEVOFLOXACIN 750 MG PO TABS: 500.0000 mg | ORAL_TABLET | ORAL | 0 refills | Status: DC

## 2018-09-06 MED ORDER — PREDNISONE 10 MG (21) PO TBPK: 10.0000 mg | ORAL_TABLET | Freq: Every day | ORAL | 0 refills | Status: DC

## 2018-09-06 MED ORDER — ONDANSETRON HCL 4 MG PO TABS: 4.0000 mg | ORAL_TABLET | Freq: Four times a day (QID) | ORAL | 0 refills | Status: DC | PRN

## 2018-09-06 MED ORDER — ACETAMINOPHEN 325 MG PO TABS: 325.0000 mg | ORAL_TABLET | Freq: Four times a day (QID) | ORAL | Status: DC | PRN

## 2018-09-06 MED ORDER — IPRATROPIUM-ALBUTEROL 0.5-2.5 (3) MG/3ML IN SOLN: 3.0000 mL | Freq: Four times a day (QID) | RESPIRATORY_TRACT | 1 refills | Status: DC | PRN

## 2018-09-06 MED ORDER — MORPHINE SULFATE (CONCENTRATE) 10 MG/0.5ML PO SOLN: 2.5000 mg | ORAL | 0 refills | Status: DC | PRN

## 2018-09-06 NOTE — Discharge Summary (Signed)
[facility] NAME: Gabriella Wiggins    MR#:  [id number]  DATE OF BIRTH:  05/16/1941  DATE OF ADMISSION:  08/31/2018 ADMITTING PHYSICIAN: Gabriella Leber, MD  DATE OF DISCHARGE: 09/06/18   PRIMARY CARE PHYSICIAN: Gabriella Patch, MD    ADMISSION DIAGNOSIS:  Shortness of breath [R06.02] Peripheral edema [R60.9] Elevated troponin [R79.89]  DISCHARGE DIAGNOSIS:  Active Problems:   Acute respiratory failure with hypoxia (HCC)   Protein-calorie malnutrition, severe   SECONDARY DIAGNOSIS:   Past Medical History:  Diagnosis Date  . Aortic atherosclerosis ([place])    a. 08/2018 noted on CT  . Breast cancer ([place]) 2004   left lumpectomy  . Cachexia ([place])   . COPD (chronic obstructive pulmonary disease) ([place])   . Coronary artery calcification seen on CT scan    a. 08/2018 seen on CT.  Marland Kitchen Personal history of radiation therapy   . Post-menopause bleeding   . Right renal artery stenosis ([place])    a. 08/2018 CTA Abd/Lower ext: Moderate R RA stenosis @ origin.  . Tobacco abuse    a. at least 61 y pack history.    HOSPITAL COURSE:   77 year old female with past medical history significant for left breast cancer, COPD not on home oxygen, CAD, ongoing smoking, cachexia presents to hospital secondary to worsening shortness of breath.  1.  Acute hypoxic respiratory failure-secondary to COPD exacerbation-seems to be at end-stage COPD -Was on BiPAP in stepdown unit.  Weaned off to nasal cannula -Appreciate pulmonary consult -On IV steroids, Levaquin and nebs/inhalers -CT chest with angiogram negative for pulmonary embolism but patient has severe emphysema -Overall prognosis is very poor and patient met with palliative care  Agreeable to go home with hospice care Foley catheter placed as per patient request as she gets extremely short of breath walking to the bathroom-for comfort care  2.  Elevated troponin-elevated troponin but  plateaued and downtrending. -Cardiology consulted.   Off heparin drip denies any chest pain -Echocardiogram shows ejection fraction 65% but significant right-sided dilation and changes. -Recommend aspirin and statin for now -Not a candidate for medical management due to respiratory issues and blood pressure as per cardiologist.  No further work-up is needed.  3.  Hyperkalemia-received V[place]assa and corrected potassium   4.  Severe malnutrition/cachexia-dietitian consult Started on Ensure and multivitamin tablets  5.  Depression and anxiety-on Wellbutrin  6.  DVT prophylaxis- Heparin  subcu  7.  Tobacco use disorder-counseled for 4 minutes and offered nicotine Wiggins.  8.  Elevated liver enzymes This could be due to right-sided heart failure, continue to monitor. Right upper quadrant ultrasound is done.  Due to overall very poor long-term prognosis, and high risk of mortality recurrent worsening of her COPD and readmissions-patient's family had agreed and met with palliative care  09/05/2018.  CODE STATUS DNR and plan is to discharge patient  home with hospice care for comfort measures today  DISCHARGE CONDITIONS:   Guarded   CONSULTS OBTAINED:     PROCEDURES   DRUG ALLERGIES:   Allergies  Allergen Reactions  . Penicillin G Other (See Comments)  . Penicillins Hives    Has patient had a PCN reaction causing immediate rash, facial/tongue/throat swelling, SOB or lightheadedness with hypotension: No Has patient had a PCN reaction causing severe rash involving mucus membranes or skin necrosis: No Has patient had a PCN reaction that required hospitalization: No Has patient had a PCN reaction occurring within the last  10 years: No If all of the above answers are "NO", then may proceed with Cephalosporin use.   . Shrimp [Shellfish Allergy] Swelling    DISCHARGE MEDICATIONS:   Allergies as of 09/06/2018      Reactions   Penicillin G Other (See Comments)   Penicillins  Hives   Has patient had a PCN reaction causing immediate rash, facial/tongue/throat swelling, SOB or lightheadedness with hypotension: No Has patient had a PCN reaction causing severe rash involving mucus membranes or skin necrosis: No Has patient had a PCN reaction that required hospitalization: No Has patient had a PCN reaction occurring within the last 10 years: No If all of the above answers are "NO", then may proceed with Cephalosporin use.   Shrimp [shellfish Allergy] Swelling      Medication List    STOP taking these medications   cholecalciferol 1000 units tablet Commonly known as:  VITAMIN D   OVER THE COUNTER MEDICATION     TAKE these medications   acetaminophen 325 MG tablet Commonly known as:  TYLENOL Take 1 tablet (325 mg total) by mouth every 6 (six) hours as needed for mild pain (or Fever >/= 101).   aspirin EC 81 MG tablet Take 81 mg by mouth daily.   atorvastatin 40 MG tablet Commonly known as:  LIPITOR Take 1 tablet (40 mg total) by mouth daily at 6 PM.   buPROPion 75 MG tablet Commonly known as:  WELLBUTRIN Take 1 tablet (75 mg total) by mouth 2 (two) times daily.   carboxymethylcellulose 0.5 % Soln Commonly known as:  REFRESH PLUS Place 1 drop into both eyes 2 (two) times daily as needed (dry eyes).   feeding supplement (ENSURE ENLIVE) Liqd Take 237 mLs by mouth 2 (two) times daily between meals.   Ipratropium-Albuterol 20-100 MCG/ACT Aers respimat Commonly known as:  COMBIVENT Inhale 1 puff into the lungs 2 (two) times daily as needed for wheezing or shortness of breath. What changed:  Another medication with the same name was added. Make sure you understand how and when to take each.   ipratropium-albuterol 0.5-2.5 (3) MG/3ML Soln Commonly known as:  DUONEB Take 3 mLs by nebulization every 6 (six) hours as needed. What changed:  You were already taking a medication with the same name, and this prescription was added. Make sure you understand how  and when to take each.   levofloxacin 750 MG tablet Commonly known as:  LEVAQUIN Take 1 tablet (750 mg total) by mouth every other day for 3 doses.   LORazepam 0.5 MG tablet Commonly known as:  ATIVAN Take 0.5-1 tablets (0.25-0.5 mg total) by mouth every 8 (eight) hours as needed for anxiety or sleep.   morphine CONCENTRATE 10 MG/0.5ML Soln concentrated solution Take 0.13-0.25 mLs (2.6-5 mg total) by mouth every 4 (four) hours as needed for moderate pain, severe pain or shortness of breath.   nicotine 21 mg/24hr Wiggins Commonly known as:  NICODERM CQ - dosed in mg/24 hours Place 1 Wiggins (21 mg total) onto the skin daily. Start taking on:  09/07/2018   ondansetron 4 MG tablet Commonly known as:  ZOFRAN Take 1 tablet (4 mg total) by mouth every 6 (six) hours as needed for nausea.   predniSONE 10 MG (21) Tbpk tablet Commonly known as:  STERAPRED UNI-PAK 21 TAB Take 1 tablet (10 mg total) by mouth daily. Take 6 tablets by mouth for 1 day followed by  5 tablets by mouth for 1 day followed by  4  tablets by mouth for 1 day followed by  3 tablets by mouth for 1 day followed by  2 tablets by mouth for 1 day followed by  1 tablet by mouth for a day and stop   PROAIR HFA 108 (90 Base) MCG/ACT inhaler Generic drug:  albuterol Inhale 1-2 puffs into the lungs every 6 (six) hours as needed.   pyridOXINE 100 MG tablet Commonly known as:  VITAMIN B-6 Take 200 mg by mouth daily.   Tiotropium Bromide-Olodaterol 2.5-2.5 MCG/ACT Aers Inhale 1 Inhaler into the lungs 2 (two) times daily.        DISCHARGE INSTRUCTIONS:  Continue oxygen via nasal cannula Hospice care at home Foley catheter for comfort Follow-up with hospice care staff and Dr. as recommended   DIET:  Regular diet  DISCHARGE CONDITION:  Fair  ACTIVITY:  Activity as tolerated  OXYGEN:  Home Oxygen: Yes.     Oxygen Delivery: 4 liters/min via Patient connected to nasal cannula oxygen  DISCHARGE LOCATION:  home    If you experience worsening of your admission symptoms, develop shortness of breath, life threatening emergency, suicidal or homicidal thoughts you must seek medical attention immediately by calling 911 or calling your MD immediately  if symptoms less severe.  You Must read complete instructions/literature along with all the possible adverse reactions/side effects for all the Medicines you take and that have been prescribed to you. Take any new Medicines after you have completely understood and accpet all the possible adverse reactions/side effects.   Please note  You were cared for by a hospitalist during your hospital stay. If you have any questions about your discharge medications or the care you received while you were in the hospital after you are discharged, you can call the unit and asked to speak with the hospitalist on call if the hospitalist that took care of you is not available. Once you are discharged, your primary care physician will handle any further medical issues. Please note that NO REFILLS for any discharge medications will be authorized once you are discharged, as it is imperative that you return to your primary care physician (or establish a relationship with a primary care physician if you do not have one) for your aftercare needs so that they can reassess your need for medications and monitor your lab values.     Today  Chief Complaint  Patient presents with  . Leg Swelling  . Shortness of Breath   Patient is still short of breath with minimal exertion resting okay while laying in the bed.  Husband at bedside.  Awaiting equipment to be delivered at home today at 2 PM Plan is to discharge patient home with hospice care  ROS:  CONSTITUTIONAL: Denies fevers, chills. Denies any fatigue, weakness.  EYES: Denies blurry vision, double vision, eye pain. EARS, NOSE, THROAT: Denies tinnitus, ear pain, hearing loss. RESPIRATORY: Denies cough, wheeze, has shortness of breath  while resting.  CARDIOVASCULAR: Denies chest pain, palpitations, edema.  GASTROINTESTINAL: Denies nausea, vomiting, diarrhea, abdominal pain. Denies bright red blood per rectum.. MUSCULOSKELETAL: Denies pain in neck, back, shoulder, knees, hips or arthritic symptoms.  NEUROLOGIC: Denies paralysis, paresthesias.  PSYCHIATRIC: Denies anxiety or depressive symptoms.   VITAL SIGNS:  Blood pressure (!) 116/59, pulse (!) 108, temperature 97.6 F (36.4 C), temperature source Oral, resp. rate 20, height _0  (1.575 m), weight 38.1 kg, SpO2 98 %.  I/O:    Intake/Output Summary (Last 24 hours) at 09/06/2018 1318 Last data filed at 09/06/2018 0600  Gross per 24 hour  Intake -  Output 75 ml  Net -75 ml    PHYSICAL EXAMINATION:  GENERAL:  77 y.o.-year-old patient lying in the bed with no acute distress.  Thin and emaciated EYES: Pupils equal, round, reactive to light and accommodation. No scleral icterus. Extraocular muscles intact.  HEENT: Head atraumatic, normocephalic. Oropharynx and nasopharynx clear.  NECK:  Supple, no jugular venous distention. No thyroid enlargement, no tenderness.  LUNGS: Moderately diminished breath sounds bilaterally, no wheezing, rales,rhonchi or crepitation.  Positive use of accessory muscles of respiration.  CARDIOVASCULAR: S1, S2 normal. No murmurs, rubs, or gallops.  ABDOMEN: Soft, non-tender,No organomegaly or mass.  EXTREMITIES: No pedal edema, cyanosis, or clubbing.  NEUROLOGIC: Awake, alert and oriented x3 sensation intact. Gait not checked.  PSYCHIATRIC: The patient is alert and oriented x 3.  SKIN: No obvious rash, lesion, or ulcer.   DATA REVIEW:   CBC Recent Labs  Lab 09/04/18 0431  WBC 13.2*  HGB 13.7  HCT 44.6  PLT 188    Chemistries  Recent Labs  Lab 09/02/18 0449 09/04/18 0431  NA 142  --   K 4.6  --   CL 99  --   CO2 36*  --   GLUCOSE 150*  --   BUN 64*  --   CREATININE 0.96  --   CALCIUM 8.9  --   MG 2.2  --   AST 194*  141*  ALT 503* 325*  ALKPHOS 107 116  BILITOT 0.8 0.6    Cardiac Enzymes Recent Labs  Lab 09/01/18 0557  TROPONINI 0.66*    Microbiology Results  Results for orders placed or performed during the hospital encounter of 08/31/18  MRSA PCR Screening     Status: None   Collection Time: 08/31/18 10:03 PM  Result Value Ref Range Status   MRSA by PCR NEGATIVE NEGATIVE Final    Comment:        The GeneXpert MRSA Assay (FDA approved for NASAL specimens only), is one component of a comprehensive MRSA colonization surveillance program. It is not intended to diagnose MRSA infection nor to guide or monitor treatment for MRSA infections. Performed at [facility], [address], [place], [place] [address]     RADIOLOGY:  No results found.  EKG:   Orders placed or performed during the hospital encounter of 08/31/18  . EKG 12-Lead  . EKG 12-Lead      Management plans discussed with the patient, family and they are in agreement.  CODE STATUS:     Code Status Orders  (From admission, onward)         Start     Ordered   08/31/18 2149  Do not attempt resuscitation (DNR)  Continuous    Question Answer Comment  In the event of cardiac or respiratory ARREST Do not call a "code blue"   In the event of cardiac or respiratory ARREST Do not perform Intubation, CPR, defibrillation or ACLS   In the event of cardiac or respiratory ARREST Use medication by any route, position, wound care, and other measures to relive pain and suffering. May use oxygen, suction and manual treatment of airway obstruction as needed for comfort.      08/31/18 2148        Code Status History    This patient has a current code status but no historical code status.    Advance Directive Documentation     Most Recent Value  Type of Advance Directive  Healthcare Power of Attorney, Living will  Pre-existing out of facility DNR order (yellow form or pink MOST form)  -  "MOST" Form in Place?   -      TOTAL TIME TAKING CARE OF THIS PATIENT: 45  minutes.   Note: This dictation was prepared with Dragon dictation along with smaller phrase technology. Any transcriptional errors that result from this process are unintentional.   _0 @  on 09/06/2018 at 1:18 PM  Between 7am to 6pm - Pager - [phone]  After 6pm go to [email or web address] - password [email or web address] [facility]  Office  [phone]  CC: Primary care physician; Gabriella Patch, MD

## 2018-09-06 NOTE — Care Management Important Message (Signed)
Important Message  Patient Details  Name: Gabriella Wiggins MRN: [id number] Date of Birth: Jun 08, 1941   Medicare Important Message Given:  Yes    Juliann Pulse A Ziyad Dyar 09/06/2018, 12:09 PM

## 2018-09-06 NOTE — Discharge Instructions (Signed)
Continue oxygen via nasal cannula Hospice care at home Foley catheter for comfort Follow-up with hospice care staff and Dr. as recommended

## 2018-09-06 NOTE — Progress Notes (Signed)
Leaving Maia Breslow who is in a code a written report and my cell number if she has any questions. I called patients husband again and left a message on voicemail that patient is still second in line. Collier Bullock RN

## 2018-09-06 NOTE — Progress Notes (Signed)
I called EMS once again and spoke with [place]. Patient is still second in line.

## 2018-09-06 NOTE — Progress Notes (Signed)
  Speech Language Pathology Treatment: Dysphagia  Patient Details Name: Gabriella Wiggins MRN: [id number] DOB: [birth year] Today's Date: 09/06/2018 Time: 0932-6712 SLP Time Calculation (min) (ACUTE ONLY): 25 min  Assessment / Plan / Recommendation Clinical Impression  Pt seen for f/u on toleration of oral diet; education on general aspiration precautions. Pt is on a regular diet consistency w/ thin liquids post initial BSE. Pt presents as very malnourished patient, laying in bed, was on BiPAP initially and in stepdown now improved and on nasal cannula oxygen - 4 L of oxygen. Patient is extremely short of breath w/ any exertion. Husband has met w/ palliative care and agreeable with home with hospice care at d/c. Pt is eating some at meals; Dietitian is following w/ nutritional support(Ensure).  Pt consumed po trials of thin liquids(few sips) and grilled cheese sandwich then a jello w/ no immediate, overt s/s of aspiration noted; no decline in vocal quality. Pt was educated on taking rest breaks during po's in order to conserve energy and avoid SOB/WOB. No gross oral phase deficits appreciated. Recommended easier to masticate/eat foods; condiments to moisten and soften. Reviewed general aspiration precautions. No further recommended skilled ST services indicated as pt appears to be tolerating her oral diet w/ no overt deficits; close to/at her baseline. NSG to reconsult if any change in status while admitted. Pt agreed.     HPI HPI: Pt was admitted to the hospital due to worsening shortness of breath and lower extremity edema.  Patient says that she has had significant swelling in her lower extremity progressively getting worse over the past 2 months and also worsening exertional dyspnea.  She has a long-standing history of tobacco abuse but currently is not on oxygen at home.  She has no history of congestive heart failure or any previous history of heart disease.  She is currently not on diuretics.  She denies  any paroxysmal nocturnal dyspnea orthopnea.  She presents to the ER was noted to be significantly hypoxic with O2 sats in the mid 70s on room air and noted to be in acute respiratory failure with hypoxia likely secondary to COPD exacerbation.  Hospitalist services were contacted for admission.  Incidentally patient was also noted to be in acute kidney injury with mild hyperkalemia and elevated troponin.  Patient does admit to a cough which is productive with gray-colored sputum but denies any hemoptysis, fever chills, nausea vomiting, chest pain, abdominal pain or any other associated symptoms presently.      SLP Plan  All goals met       Recommendations  Diet recommendations: Regular;Thin liquid Liquids provided via: Cup;Straw Medication Administration: Whole meds with liquid(as able or in Puree for easier swallowing) Supervision: Patient able to self feed Compensations: Minimize environmental distractions;Slow rate;Small sips/bites;Follow solids with liquid Postural Changes and/or Swallow Maneuvers: Seated upright 90 degrees;Upright 30-60 min after meal                General recommendations: (Dietician following; possible Hospice servcies) Oral Care Recommendations: Oral care BID;Patient independent with oral care;Staff/trained caregiver to provide oral care Follow up Recommendations: None SLP Visit Diagnosis: Dysphagia, unspecified (R13.10) Plan: All goals met       GO                Orinda Kenner, MS, CCC-SLP Verniece Encarnacion 09/06/2018, 2:58 PM

## 2018-09-06 NOTE — Care Management (Signed)
Discharge to home today per Dr. Margaretmary Eddy. Will be followed by [facility] . Representative, Reed Pandy updated.  Will be transported per [facility] unit. After equipment delivered. Shelbie Ammons RN MSN CCM Care Management [phone]

## 2018-09-06 NOTE — Progress Notes (Signed)
Nutrition Follow-up  DOCUMENTATION CODES:   Severe malnutrition in context of chronic illness  INTERVENTION:  Continue Ensure Enlive po BID, each supplement provides 350 kcal and 20 grams of protein.  NUTRITION DIAGNOSIS:   Severe Malnutrition related to chronic illness(COPD and breast cancer) as evidenced by severe fat depletion, severe muscle depletion.  Ongoing.  GOAL:   Patient will meet greater than or equal to 90% of their needs  Progressing.  MONITOR:   PO intake, Weight trends, Supplement acceptance, Labs  REASON FOR ASSESSMENT:   Malnutrition Screening Tool    ASSESSMENT:   77 year old female with past medical history of COPD, ongoing tobacco abuse, history of breast cancer, anxiety who presents to the hospital due to worsening lower extremity edema and shortness of breath.  Patient continues to have decreased appetite and intake. She was able to eat 100% of her breakfast yesterday but it was only her oatmeal. She is drinking 1-2 bottles of Ensure per day. Patient and family met with PMT yesterday. Plan is for discharge home with hospice. The plan is to continue all current interventions while in the hospital and then transition to comfort care once home. MVI and vitamin C have been discontinued, likely to reduce pill burden, which is appropriate at this point.  Medications reviewed and include: Solu-Medrol 40 mg Q12hrs IV, Levaquin.  Labs reviewed.  Diet Order:   Diet Order            Diet regular Room service appropriate? Yes; Fluid consistency: Thin  Diet effective now             EDUCATION NEEDS:   No education needs have been identified at this time  Skin:  Skin Assessment: Reviewed RN Assessment(ecchymosis BUE)  Last BM:  12/4  Height:   Ht Readings from Last 1 Encounters:  08/31/18 '5\' 2"'  (1.575 m)   Weight:   Wt Readings from Last 1 Encounters:  08/31/18 38.1 kg   Ideal Body Weight:  50 kg  BMI:  Body mass index is 15.36  kg/m.  Estimated Nutritional Needs:   Kcal:  1150-1350  Protein:  55-65 g  Fluid:  950 ml  Willey Blade, MS, RD, LDN Office: [phone] Pager: [phone] After Hours/Weekend Pager: [phone]

## 2018-09-11 ENCOUNTER — Telehealth: Payer: Self-pay

## 2018-09-11 NOTE — Telephone Encounter (Signed)
Apolonio Schneiders from [facility] called wanting to know if Ronnald Ramp would sign orders for pt while under care of [place]. Explained that she would sign PT/ OT Plan of care, etc. However, she will be unable to monitor or prescribe pain meds such as morphine. [phone]

## 2018-09-15 ENCOUNTER — Telehealth: Payer: Self-pay | Admitting: Family Medicine

## 2018-09-15 NOTE — Telephone Encounter (Signed)
Tried to call patient at home # to schedule AWV-S. Last AWV 09/04/17.  No answer at home #.  L/M to call Lattie Haw at [phone].  lec

## 2018-09-22 NOTE — Telephone Encounter (Signed)
Called patient on home and cell phone [phone].  No answer.  Need to schedule AWV-S.  Last AWV 09/01/17

## 2018-10-02 ENCOUNTER — Other Ambulatory Visit: Payer: Self-pay

## 2018-10-02 MED ORDER — ATORVASTATIN CALCIUM 40 MG PO TABS: 40.0000 mg | ORAL_TABLET | Freq: Every day | ORAL | 1 refills | Status: DC

## 2018-10-11 ENCOUNTER — Telehealth: Payer: Self-pay

## 2018-10-11 NOTE — Telephone Encounter (Signed)
Hospice nurse called to get a refill on Lorazepam- was told that we would refill 1 time, after this she would need to get hospice on call physician to fill due to controlled nature. Called into [facility]

## 2018-10-20 ENCOUNTER — Telehealth: Payer: Self-pay | Admitting: Family Medicine

## 2018-10-20 NOTE — Telephone Encounter (Signed)
Spoke with patient to schedule AWV, but patient DECLINED the appointment for this year.  She states she can't get into a car now. Janace Hoard, Care Guide.

## 2018-11-28 ENCOUNTER — Other Ambulatory Visit: Payer: Self-pay | Admitting: Family Medicine

## 2018-12-21 ENCOUNTER — Other Ambulatory Visit: Payer: Self-pay

## 2018-12-21 MED ORDER — IPRATROPIUM-ALBUTEROL 0.5-2.5 (3) MG/3ML IN SOLN: 3.0000 mL | Freq: Four times a day (QID) | RESPIRATORY_TRACT | 1 refills | Status: DC | PRN

## 2018-12-21 NOTE — Progress Notes (Unsigned)
[facility] called- wanted neb solution sent into [facility]- done

## 2018-12-25 ENCOUNTER — Other Ambulatory Visit: Payer: Self-pay | Admitting: Family Medicine

## 2019-01-23 ENCOUNTER — Other Ambulatory Visit: Payer: Self-pay

## 2019-01-23 DIAGNOSIS — R609 Edema, unspecified: Secondary | ICD-10-CM

## 2019-01-23 MED ORDER — FUROSEMIDE 20 MG PO TABS: 20.0000 mg | ORAL_TABLET | Freq: Every day | ORAL | 1 refills | Status: DC

## 2019-01-23 NOTE — Progress Notes (Signed)
Apolonio Schneiders from [facility] called stating that pt has 2+ pitting edema. She wanted to begin Furosemide for pt- sent in Lasix 20mg  qday to [place]

## 2019-02-27 ENCOUNTER — Telehealth: Payer: Self-pay

## 2019-02-27 NOTE — Telephone Encounter (Signed)
Apolonio Schneiders from [facility] called to get the verbal consent to switch pt to palliative care. Was given the "okay" to do so. Call back number is [phone]

## 2019-03-05 ENCOUNTER — Telehealth: Payer: Self-pay

## 2019-03-05 NOTE — Telephone Encounter (Signed)
Pt was contacted after getting a call from [facility]. Pt's insurance will not pay for palliative care. Stay on Hospice for now as a standby, in case there is a sudden change in health. Called Lauren [phone]

## 2019-03-06 ENCOUNTER — Other Ambulatory Visit: Payer: Self-pay

## 2019-03-06 DIAGNOSIS — R634 Abnormal weight loss: Secondary | ICD-10-CM

## 2019-03-06 DIAGNOSIS — R0602 Shortness of breath: Secondary | ICD-10-CM

## 2019-03-09 ENCOUNTER — Other Ambulatory Visit: Payer: Self-pay

## 2019-03-09 DIAGNOSIS — J432 Centrilobular emphysema: Secondary | ICD-10-CM

## 2019-03-09 DIAGNOSIS — R609 Edema, unspecified: Secondary | ICD-10-CM

## 2019-03-09 MED ORDER — PROAIR HFA 108 (90 BASE) MCG/ACT IN AERS: 1.0000 | INHALATION_SPRAY | Freq: Four times a day (QID) | RESPIRATORY_TRACT | 3 refills | Status: DC | PRN

## 2019-03-09 MED ORDER — FUROSEMIDE 20 MG PO TABS: 20.0000 mg | ORAL_TABLET | Freq: Every day | ORAL | 1 refills | Status: DC

## 2019-04-27 ENCOUNTER — Other Ambulatory Visit: Payer: Self-pay

## 2019-05-07 ENCOUNTER — Telehealth: Payer: Self-pay

## 2019-05-07 NOTE — Telephone Encounter (Signed)
Apolonio Schneiders from [facility] called to see if we had any evidence of declination of health on pt. If not, they are going to have to d/c services. I spoke with Dr Ronnald Ramp and we can't find anything that would keep her on. The last time we saw her was Sept of last year. I compared weight loss from what Apolonio Schneiders has= 98 to what we had then= 79. Therefore, she has gained weight since we last saw her.

## 2019-05-21 ENCOUNTER — Other Ambulatory Visit: Payer: Self-pay

## 2019-05-21 ENCOUNTER — Telehealth: Payer: Self-pay

## 2019-05-21 ENCOUNTER — Ambulatory Visit
Admission: RE | Admit: 2019-05-21 | Discharge: 2019-05-21 | Disposition: A | Discharge status: Home / Self Care | Payer: PPO | Attending: Family Medicine | Admitting: Family Medicine

## 2019-05-21 ENCOUNTER — Encounter: Payer: Self-pay | Admitting: Family Medicine

## 2019-05-21 ENCOUNTER — Ambulatory Visit
Admission: RE | Admit: 2019-05-21 | Discharge: 2019-05-21 | Disposition: A | Discharge status: Home / Self Care | Payer: PPO | Source: Ambulatory Visit | Attending: Family Medicine | Admitting: Family Medicine

## 2019-05-21 ENCOUNTER — Ambulatory Visit (INDEPENDENT_AMBULATORY_CARE_PROVIDER_SITE_OTHER): Payer: PPO | Admitting: Family Medicine

## 2019-05-21 VITALS — BP 130/80 | HR 88 | Ht 62.0 in | Wt 98.0 lb

## 2019-05-21 DIAGNOSIS — M545 Low back pain, unspecified: Secondary | ICD-10-CM

## 2019-05-21 DIAGNOSIS — J432 Centrilobular emphysema: Secondary | ICD-10-CM | POA: Diagnosis not present

## 2019-05-21 DIAGNOSIS — R109 Unspecified abdominal pain: Secondary | ICD-10-CM

## 2019-05-21 DIAGNOSIS — I25119 Atherosclerotic heart disease of native coronary artery with unspecified angina pectoris: Secondary | ICD-10-CM | POA: Insufficient documentation

## 2019-05-21 DIAGNOSIS — E43 Unspecified severe protein-calorie malnutrition: Secondary | ICD-10-CM

## 2019-05-21 DIAGNOSIS — R1010 Upper abdominal pain, unspecified: Secondary | ICD-10-CM

## 2019-05-21 DIAGNOSIS — M5126 Other intervertebral disc displacement, lumbar region: Secondary | ICD-10-CM | POA: Diagnosis not present

## 2019-05-21 DIAGNOSIS — Z853 Personal history of malignant neoplasm of breast: Secondary | ICD-10-CM | POA: Diagnosis not present

## 2019-05-21 LAB — POCT URINALYSIS DIPSTICK
Bilirubin, UA: NEGATIVE
Blood, UA: NEGATIVE
Glucose, UA: NEGATIVE
Ketones, UA: NEGATIVE
Nitrite, UA: NEGATIVE
Protein, UA: NEGATIVE
Spec Grav, UA: 1.02 (ref 1.010–1.025)
Urobilinogen, UA: 0.2 E.U./dL
pH, UA: 6 (ref 5.0–8.0)

## 2019-05-21 MED ORDER — TRAMADOL HCL 50 MG PO TABS: 50.0000 mg | ORAL_TABLET | Freq: Three times a day (TID) | ORAL | 0 refills | Status: DC | PRN

## 2019-05-21 MED ORDER — NITROFURANTOIN MONOHYD MACRO 100 MG PO CAPS: 100.0000 mg | ORAL_CAPSULE | Freq: Two times a day (BID) | ORAL | 0 refills | Status: DC

## 2019-05-21 NOTE — Progress Notes (Signed)
Date:  05/21/2019   Name:  Gabriella Wiggins   DOB:  10/09/1940   MRN:  [id number]   Chief Complaint: Flank Pain (started x 2 weeks ago- started on the L) side and has moved around to the R) side. Started after lifting a laundry basket with some socks in it.)  Flank Pain This is a new problem. The current episode started 1 to 4 weeks ago (on the 10th). The problem occurs constantly. The problem has been gradually worsening since onset. The pain is present in the lumbar spine. The quality of the pain is described as aching. The pain does not radiate. The pain is at a severity of 9/10. The pain is severe. The pain is the same all the time. The symptoms are aggravated by bending and twisting. Associated symptoms include abdominal pain. Pertinent negatives include no bladder incontinence, dysuria, fever, headaches, leg pain, numbness, paresis, paresthesias, pelvic pain, tingling or weakness. She has tried analgesics for the symptoms.  Abdominal Pain This is a chronic problem. The current episode started 1 to 4 weeks ago. The onset quality is sudden. The problem has been gradually worsening. The pain is located in the RLQ and RUQ. The pain is at a severity of 8/10. The pain is severe. The quality of the pain is colicky. The abdominal pain radiates to the back. Pertinent negatives include no arthralgias, constipation, diarrhea, dysuria, fever, frequency, headaches, hematuria, myalgias, nausea or vomiting. The pain is aggravated by movement.    Review of Systems  Constitutional: Negative.  Negative for chills, fatigue, fever and unexpected weight change.  HENT: Negative for congestion, ear discharge, ear pain, mouth sores, rhinorrhea, sinus pressure, sneezing and sore throat.   Eyes: Negative for photophobia, pain, discharge, redness and itching.  Respiratory: Negative for cough, shortness of breath, wheezing and stridor.   Gastrointestinal: Positive for abdominal pain. Negative for blood in stool,  constipation, diarrhea, nausea and vomiting.  Endocrine: Negative for cold intolerance, heat intolerance, polydipsia, polyphagia and polyuria.  Genitourinary: Positive for flank pain. Negative for bladder incontinence, dysuria, frequency, hematuria, menstrual problem, pelvic pain, urgency, vaginal bleeding and vaginal discharge.  Musculoskeletal: Negative for arthralgias, back pain and myalgias.  Skin: Negative for rash.  Allergic/Immunologic: Negative for environmental allergies and food allergies.  Neurological: Negative for dizziness, tingling, weakness, light-headedness, numbness, headaches and paresthesias.  Hematological: Negative for adenopathy. Does not bruise/bleed easily.  Psychiatric/Behavioral: Negative for dysphoric mood. The patient is not nervous/anxious.     Patient Active Problem List   Diagnosis Date Noted  . Protein-calorie malnutrition, severe 09/02/2018  . Acute respiratory failure with hypoxia ([place]) 08/31/2018  . Atherosclerosis of aorta ([place]) 06/06/2018  . PMB (postmenopausal bleeding) 06/27/2017  . Genetic testing 06/08/2017  . Centrilobular emphysema ([place]) 09/09/2016  . Hematuria, gross 07/24/2015    Allergies  Allergen Reactions  . Penicillin G Other (See Comments)  . Penicillins Hives    Has patient had a PCN reaction causing immediate rash, facial/tongue/throat swelling, SOB or lightheadedness with hypotension: No Has patient had a PCN reaction causing severe rash involving mucus membranes or skin necrosis: No Has patient had a PCN reaction that required hospitalization: No Has patient had a PCN reaction occurring within the last 10 years: No If all of the above answers are "NO", then may proceed with Cephalosporin use.   Marland Kitchen Shrimp [Shellfish Allergy] Swelling    Past Surgical History:  Procedure Laterality Date  . APPENDECTOMY    . bilateral tubal    . BREAST  BIOPSY Right    milk duct removed -neg  . BREAST BIOPSY Left 2004   lumpectomy/rad  .  BREAST LUMPECTOMY Left   . DILATATION & CURETTAGE/HYSTEROSCOPY WITH MYOSURE N/A 06/20/2017   Procedure: DILATATION & CURETTAGE/HYSTEROSCOPY WITH MYOSURE;  Surgeon: Schermerhorn, Gwen Her, MD;  Location: [facility] ORS;  Service: Gynecology;  Laterality: N/A;  . HYSTEROSCOPY W/D&C N/A 06/20/2017   Procedure: DILATATION AND CURETTAGE /HYSTEROSCOPY;  Surgeon: Schermerhorn, Gwen Her, MD;  Location: [facility] ORS;  Service: Gynecology;  Laterality: N/A;  . OVARIAN CYST SURGERY      Social History   Tobacco Use  . Smoking status: Former Smoker    Packs/day: 1.00    Years: 55.00    Pack years: 55.00    Types: Cigarettes    Quit date: 08/27/2018    Years since quitting: 0.7  . Smokeless tobacco: Never Used  Substance Use Topics  . Alcohol use: No    Alcohol/week: 0.0 standard drinks  . Drug use: No     Medication list has been reviewed and updated.  Current Meds  Medication Sig  . acetaminophen (TYLENOL) 325 MG tablet Take 1 tablet (325 mg total) by mouth every 6 (six) hours as needed for mild pain (or Fever >/= 101).  Marland Kitchen aspirin EC 81 MG tablet Take 81 mg by mouth daily.  Marland Kitchen atorvastatin (LIPITOR) 40 MG tablet TAKE 1 TABLET (40 MG TOTAL) BY MOUTH DAILY AT 6 PM.  . buPROPion (WELLBUTRIN) 75 MG tablet Take 1 tablet (75 mg total) by mouth 2 (two) times daily.  . carboxymethylcellulose (REFRESH PLUS) 0.5 % SOLN Place 1 drop into both eyes 2 (two) times daily as needed (dry eyes).  . furosemide (LASIX) 20 MG tablet Take 1 tablet (20 mg total) by mouth daily.  . Ipratropium-Albuterol (COMBIVENT RESPIMAT) 20-100 MCG/ACT AERS respimat Inhale 1 puff into the lungs 2 (two) times daily as needed for wheezing or shortness of breath.  Marland Kitchen ipratropium-albuterol (DUONEB) 0.5-2.5 (3) MG/3ML SOLN Take 3 mLs by nebulization every 6 (six) hours as needed.  Marland Kitchen PROAIR HFA 108 (90 Base) MCG/ACT inhaler Inhale 1-2 puffs into the lungs every 6 (six) hours as needed.  . pyridOXINE (VITAMIN B-6) 100 MG tablet Take 200 mg by  mouth daily.   . Tiotropium Bromide-Olodaterol (STIOLTO RESPIMAT) 2.5-2.5 MCG/ACT AERS Inhale 1 Inhaler into the lungs 2 (two) times daily.  . [DISCONTINUED] feeding supplement, ENSURE ENLIVE, (ENSURE ENLIVE) LIQD Take 237 mLs by mouth 2 (two) times daily between meals.  . [DISCONTINUED] LORazepam (ATIVAN) 0.5 MG tablet Take 0.5-1 tablets (0.25-0.5 mg total) by mouth every 8 (eight) hours as needed for anxiety or sleep.  . [DISCONTINUED] nicotine (NICODERM CQ - DOSED IN MG/24 HOURS) 21 mg/24hr patch Place 1 patch (21 mg total) onto the skin daily.    PHQ 2/9 Scores 09/01/2017 07/21/2017 04/08/2017 04/08/2016  PHQ - 2 Score 0 0 0 0  PHQ- 9 Score 0 0 - -    BP Readings from Last 3 Encounters:  05/21/19 130/80  09/06/18 134/78  08/31/18 (!) 127/48    Physical Exam Vitals signs and nursing note reviewed.  Constitutional:      General: She is not in acute distress.    Appearance: She is not diaphoretic.  HENT:     Head: Normocephalic and atraumatic.     Right Ear: Tympanic membrane, ear canal and external ear normal.     Left Ear: Tympanic membrane, ear canal and external ear normal.     Nose: Nose  normal.     Mouth/Throat:     Mouth: Mucous membranes are moist. Mucous membranes are pale.  Eyes:     General:        Right eye: No discharge.        Left eye: No discharge.     Conjunctiva/sclera: Conjunctivae normal.     Pupils: Pupils are equal, round, and reactive to light.  Neck:     Musculoskeletal: Normal range of motion and neck supple.     Thyroid: No thyromegaly.     Vascular: No JVD.  Cardiovascular:     Rate and Rhythm: Normal rate and regular rhythm.     Pulses: Normal pulses.     Heart sounds: Normal heart sounds. No murmur. No friction rub. No gallop.   Pulmonary:     Effort: Pulmonary effort is normal.     Breath sounds: Decreased air movement present. Decreased breath sounds present. No wheezing or rhonchi.  Abdominal:     General: Bowel sounds are normal. There is  no distension.     Palpations: Abdomen is soft. There is no mass.     Tenderness: There is no abdominal tenderness. There is no guarding.  Musculoskeletal: Normal range of motion.  Lymphadenopathy:     Cervical: No cervical adenopathy.  Skin:    General: Skin is warm and dry.     Coloration: Skin is pale.  Neurological:     General: No focal deficit present.     Mental Status: She is alert.     Deep Tendon Reflexes: Reflexes are normal and symmetric.     Wt Readings from Last 3 Encounters:  05/21/19 98 lb (44.5 kg)  08/31/18 84 lb (38.1 kg)  08/31/18 84 lb (38.1 kg)    BP 130/80   Pulse 88   Ht 5\' 2"  (1.575 m)   Wt 98 lb (44.5 kg)   BMI 17.92 kg/m   Assessment and Plan: 1. Pain of upper abdomen Patient has had pain in the right upper and lower quadrant for about 2 weeks diminish in our has resumed and is to a significant level.  Exam is consistent with tenderness however there is no rebound and guarding is minimal.  Is uncertain to that the patient had a incident that may have injured the back and whether or not she is having spasm to the abdominal muscles.  Patient does have a history of remote breast cancer.  We will get a CMP and CBC for evaluation. - DG Lumbar Spine Complete; Future - traMADol (ULTRAM) 50 MG tablet; Take 1 tablet (50 mg total) by mouth every 8 (eight) hours as needed for up to 5 days.  Dispense: 15 tablet; Refill: 0 - nitrofurantoin, macrocrystal-monohydrate, (MACROBID) 100 MG capsule; Take 1 capsule (100 mg total) by mouth 2 (two) times daily.  Dispense: 6 capsule; Refill: 0 - POCT urinalysis dipstick  2. Flank pain Reviewed patient's GYN evaluation for postmenopausal bleeding in 2018.  Patient will had an unremarkable evaluation with pelvic and transvaginal ultrasound.  Will obtain a CBC to note if there is any leukocytosis in the meantime signs were noted on the urinalysis and will cover with a 3-day dosing for cystitis. - Comprehensive metabolic panel  - CBC with Differential/Platelet - nitrofurantoin, macrocrystal-monohydrate, (MACROBID) 100 MG capsule; Take 1 capsule (100 mg total) by mouth 2 (two) times daily.  Dispense: 6 capsule; Refill: 0 - POCT urinalysis dipstick  3. Acute low back pain without sciatica, unspecified back pain laterality Patient was  bending over picking up a basket of socks and experienced severe back pain.  Patient is at significant risk for osteoporosis and possible compression fracture.  Will obtain lumbar x-ray to evaluate for possible compression fracture or any metastatic disease.  Due to her history of breast cancer. - Comprehensive metabolic panel - CBC with Differential/Platelet  4. History of breast cancer Patient has been followed in the past by Dr. Celesta Aver for breast cancer.  This was reviewed and this is noted for possible further evaluation if pain should continue  5. Unspecified severe protein-calorie malnutrition ([place]) Patient has stabilized her weight loss and is now having some weight gain.  Have encouraged her to continue to eat despite her decrease in appetite  6. Centrilobular emphysema ([place]) Patient has severe centrilobular emphysema she is followed by pulmonary.  She will continue her inhalers as noted.  7. Atherosclerosis of native coronary artery of native heart with angina pectoris [facility]) Patient has a history of atherosclerosis of the aorta.  Patient is on low-dose aspirin for decreasing risk of stroke and heart attack.

## 2019-05-21 NOTE — Telephone Encounter (Signed)
Lauren from [facility] called to see if there was any reason to pick pt back up under Hospice care. It was explained to her that the pt has gained weight since her last visit and appears to be overall stable. We are treating for a UTI and doing an xray today to check for a compression fracture. Will notify her of results when they come in tomorrow. [phone]

## 2019-05-22 ENCOUNTER — Other Ambulatory Visit: Payer: Self-pay

## 2019-05-22 DIAGNOSIS — S22080A Wedge compression fracture of T11-T12 vertebra, initial encounter for closed fracture: Secondary | ICD-10-CM

## 2019-05-22 LAB — CBC WITH DIFFERENTIAL/PLATELET
Basophils Absolute: 0.1 10*3/uL (ref 0.0–0.2)
Basos: 1 %
EOS (ABSOLUTE): 0.2 10*3/uL (ref 0.0–0.4)
Eos: 2 %
Hematocrit: 38.2 % (ref 34.0–46.6)
Hemoglobin: 12.8 g/dL (ref 11.1–15.9)
Immature Grans (Abs): 0 10*3/uL (ref 0.0–0.1)
Immature Granulocytes: 0 %
Lymphocytes Absolute: 1.3 10*3/uL (ref 0.7–3.1)
Lymphs: 21 %
MCH: 31.8 pg (ref 26.6–33.0)
MCHC: 33.5 g/dL (ref 31.5–35.7)
MCV: 95 fL (ref 79–97)
Monocytes Absolute: 0.4 10*3/uL (ref 0.1–0.9)
Monocytes: 6 %
Neutrophils Absolute: 4.3 10*3/uL (ref 1.4–7.0)
Neutrophils: 70 %
Platelets: 290 10*3/uL (ref 150–450)
RBC: 4.02 x10E6/uL (ref 3.77–5.28)
RDW: 10.6 % — ABNORMAL LOW (ref 11.7–15.4)
WBC: 6.2 10*3/uL (ref 3.4–10.8)

## 2019-05-22 LAB — COMPREHENSIVE METABOLIC PANEL
ALT: 11 IU/L (ref 0–32)
AST: 25 IU/L (ref 0–40)
Albumin/Globulin Ratio: 1.6 (ref 1.2–2.2)
Albumin: 4.9 g/dL — ABNORMAL HIGH (ref 3.7–4.7)
Alkaline Phosphatase: 158 IU/L — ABNORMAL HIGH (ref 39–117)
BUN/Creatinine Ratio: 17 (ref 12–28)
BUN: 14 mg/dL (ref 8–27)
Bilirubin Total: 0.5 mg/dL (ref 0.0–1.2)
CO2: 30 mmol/L — ABNORMAL HIGH (ref 20–29)
Calcium: 10.7 mg/dL — ABNORMAL HIGH (ref 8.7–10.3)
Chloride: 95 mmol/L — ABNORMAL LOW (ref 96–106)
Creatinine, Ser: 0.82 mg/dL (ref 0.57–1.00)
GFR calc Af Amer: 80 mL/min/{1.73_m2} (ref >59)
GFR calc non Af Amer: 70 mL/min/{1.73_m2} (ref >59)
Globulin, Total: 3.1 g/dL (ref 1.5–4.5)
Glucose: 77 mg/dL (ref 65–99)
Potassium: 4.4 mmol/L (ref 3.5–5.2)
Sodium: 142 mmol/L (ref 134–144)
Total Protein: 8 g/dL (ref 6.0–8.5)

## 2019-05-22 NOTE — Progress Notes (Unsigned)
Ref to ortho for compression fx

## 2019-05-23 ENCOUNTER — Other Ambulatory Visit: Payer: Self-pay | Admitting: Orthopedic Surgery

## 2019-05-23 DIAGNOSIS — S22080A Wedge compression fracture of T11-T12 vertebra, initial encounter for closed fracture: Secondary | ICD-10-CM

## 2019-05-24 ENCOUNTER — Ambulatory Visit
Admission: RE | Admit: 2019-05-24 | Discharge: 2019-05-24 | Disposition: A | Discharge status: Home / Self Care | Payer: PPO | Source: Ambulatory Visit | Attending: Orthopedic Surgery | Admitting: Orthopedic Surgery

## 2019-05-24 ENCOUNTER — Other Ambulatory Visit: Payer: Self-pay

## 2019-05-24 DIAGNOSIS — S22080A Wedge compression fracture of T11-T12 vertebra, initial encounter for closed fracture: Secondary | ICD-10-CM | POA: Insufficient documentation

## 2019-05-25 ENCOUNTER — Other Ambulatory Visit
Admission: RE | Admit: 2019-05-25 | Discharge: 2019-05-25 | Disposition: A | Discharge status: Home / Self Care | Payer: PPO | Source: Ambulatory Visit | Attending: Orthopedic Surgery | Admitting: Orthopedic Surgery

## 2019-05-25 DIAGNOSIS — Z20828 Contact with and (suspected) exposure to other viral communicable diseases: Secondary | ICD-10-CM | POA: Insufficient documentation

## 2019-05-25 DIAGNOSIS — Z01812 Encounter for preprocedural laboratory examination: Secondary | ICD-10-CM | POA: Diagnosis not present

## 2019-05-25 DIAGNOSIS — S22080G Wedge compression fracture of T11-T12 vertebra, subsequent encounter for fracture with delayed healing: Secondary | ICD-10-CM | POA: Diagnosis not present

## 2019-05-26 LAB — SARS CORONAVIRUS 2 (TAT 6-24 HRS): SARS Coronavirus 2: NEGATIVE

## 2019-05-28 MED ORDER — CLINDAMYCIN PHOSPHATE 900 MG/50ML IV SOLN
900.0000 mg | Freq: Once | INTRAVENOUS | Status: AC
  Administered 2019-05-29: 900 mg via INTRAVENOUS

## 2019-05-29 ENCOUNTER — Encounter
Admission: RE | Disposition: A | Discharge status: Home / Self Care | Payer: Self-pay | Source: Home / Self Care | Attending: Orthopedic Surgery

## 2019-05-29 ENCOUNTER — Ambulatory Visit
Admission: RE | Admit: 2019-05-29 | Discharge: 2019-05-29 | Disposition: A | Discharge status: Home / Self Care | Payer: PPO | Attending: Orthopedic Surgery | Admitting: Orthopedic Surgery

## 2019-05-29 ENCOUNTER — Ambulatory Visit: Payer: PPO

## 2019-05-29 ENCOUNTER — Encounter: Payer: Self-pay | Admitting: *Deleted

## 2019-05-29 ENCOUNTER — Ambulatory Visit: Payer: PPO | Admitting: Certified Registered Nurse Anesthetist

## 2019-05-29 DIAGNOSIS — Z853 Personal history of malignant neoplasm of breast: Secondary | ICD-10-CM | POA: Diagnosis not present

## 2019-05-29 DIAGNOSIS — S22000A Wedge compression fracture of unspecified thoracic vertebra, initial encounter for closed fracture: Secondary | ICD-10-CM | POA: Diagnosis not present

## 2019-05-29 DIAGNOSIS — Z8249 Family history of ischemic heart disease and other diseases of the circulatory system: Secondary | ICD-10-CM | POA: Insufficient documentation

## 2019-05-29 DIAGNOSIS — I700 Atherosclerosis of aorta: Secondary | ICD-10-CM | POA: Diagnosis not present

## 2019-05-29 DIAGNOSIS — S22080A Wedge compression fracture of T11-T12 vertebra, initial encounter for closed fracture: Secondary | ICD-10-CM | POA: Insufficient documentation

## 2019-05-29 DIAGNOSIS — Z7982 Long term (current) use of aspirin: Secondary | ICD-10-CM | POA: Insufficient documentation

## 2019-05-29 DIAGNOSIS — Z803 Family history of malignant neoplasm of breast: Secondary | ICD-10-CM | POA: Diagnosis not present

## 2019-05-29 DIAGNOSIS — Z981 Arthrodesis status: Secondary | ICD-10-CM | POA: Diagnosis not present

## 2019-05-29 DIAGNOSIS — Z880 Allergy status to penicillin: Secondary | ICD-10-CM | POA: Diagnosis not present

## 2019-05-29 DIAGNOSIS — Z419 Encounter for procedure for purposes other than remedying health state, unspecified: Secondary | ICD-10-CM

## 2019-05-29 DIAGNOSIS — Z9981 Dependence on supplemental oxygen: Secondary | ICD-10-CM | POA: Diagnosis not present

## 2019-05-29 DIAGNOSIS — J449 Chronic obstructive pulmonary disease, unspecified: Secondary | ICD-10-CM | POA: Insufficient documentation

## 2019-05-29 DIAGNOSIS — Z833 Family history of diabetes mellitus: Secondary | ICD-10-CM | POA: Insufficient documentation

## 2019-05-29 DIAGNOSIS — Z87891 Personal history of nicotine dependence: Secondary | ICD-10-CM | POA: Diagnosis not present

## 2019-05-29 DIAGNOSIS — Z7951 Long term (current) use of inhaled steroids: Secondary | ICD-10-CM | POA: Insufficient documentation

## 2019-05-29 DIAGNOSIS — Z79899 Other long term (current) drug therapy: Secondary | ICD-10-CM | POA: Diagnosis not present

## 2019-05-29 DIAGNOSIS — T148XXA Other injury of unspecified body region, initial encounter: Secondary | ICD-10-CM

## 2019-05-29 DIAGNOSIS — Z91013 Allergy to seafood: Secondary | ICD-10-CM | POA: Insufficient documentation

## 2019-05-29 DIAGNOSIS — X500XXA Overexertion from strenuous movement or load, initial encounter: Secondary | ICD-10-CM | POA: Diagnosis not present

## 2019-05-29 HISTORY — PX: KYPHOPLASTY: SHX5884

## 2019-05-29 SURGERY — KYPHOPLASTY
Anesthesia: Monitor Anesthesia Care | Site: Back

## 2019-05-29 MED ORDER — LIDOCAINE HCL 1 % IJ SOLN
INTRAMUSCULAR | Status: DC | PRN
  Administered 2019-05-29 (×2): 10 mL

## 2019-05-29 MED ORDER — FAMOTIDINE 20 MG PO TABS
ORAL_TABLET | ORAL | Status: AC
  Administered 2019-05-29: 14:00:00 20 mg via ORAL
  Filled 2019-05-29: qty 1

## 2019-05-29 MED ORDER — BUPIVACAINE-EPINEPHRINE (PF) 0.5% -1:200000 IJ SOLN
INTRAMUSCULAR | Status: DC | PRN
  Administered 2019-05-29: 10 mL via PERINEURAL

## 2019-05-29 MED ORDER — HYDROCODONE-ACETAMINOPHEN 5-325 MG PO TABS: 1.0000 | ORAL_TABLET | Freq: Four times a day (QID) | ORAL | 0 refills | Status: DC | PRN

## 2019-05-29 MED ORDER — MIDAZOLAM HCL 2 MG/2ML IJ SOLN
INTRAMUSCULAR | Status: DC | PRN
  Administered 2019-05-29: 0.5 mg via INTRAVENOUS

## 2019-05-29 MED ORDER — KETAMINE HCL 50 MG/ML IJ SOLN
INTRAMUSCULAR | Status: AC
  Filled 2019-05-29: qty 10

## 2019-05-29 MED ORDER — HYDROCODONE-ACETAMINOPHEN 5-325 MG PO TABS: 1.0000 | ORAL_TABLET | ORAL | Status: DC | PRN

## 2019-05-29 MED ORDER — CLINDAMYCIN PHOSPHATE 900 MG/50ML IV SOLN
INTRAVENOUS | Status: AC
  Filled 2019-05-29: qty 50

## 2019-05-29 MED ORDER — LACTATED RINGERS IV SOLN
INTRAVENOUS | Status: DC
  Administered 2019-05-29: 14:00:00 via INTRAVENOUS

## 2019-05-29 MED ORDER — PROPOFOL 10 MG/ML IV BOLUS
INTRAVENOUS | Status: AC
  Filled 2019-05-29: qty 20

## 2019-05-29 MED ORDER — FENTANYL CITRATE (PF) 100 MCG/2ML IJ SOLN: 25.0000 ug | INTRAMUSCULAR | Status: DC | PRN

## 2019-05-29 MED ORDER — KETAMINE HCL 50 MG/ML IJ SOLN
INTRAMUSCULAR | Status: DC | PRN
  Administered 2019-05-29 (×2): 10 mg via INTRAMUSCULAR

## 2019-05-29 MED ORDER — MIDAZOLAM HCL 2 MG/2ML IJ SOLN
INTRAMUSCULAR | Status: AC
  Filled 2019-05-29: qty 2

## 2019-05-29 MED ORDER — IOHEXOL 180 MG/ML  SOLN
INTRAMUSCULAR | Status: DC | PRN
  Administered 2019-05-29: 20 mL via INTRAVENOUS

## 2019-05-29 MED ORDER — FAMOTIDINE 20 MG PO TABS
20.0000 mg | ORAL_TABLET | Freq: Once | ORAL | Status: AC
  Administered 2019-05-29: 14:00:00 20 mg via ORAL

## 2019-05-29 SURGICAL SUPPLY — 23 items
BNDG ADH 2 X3.75 FABRIC TAN LF (GAUZE/BANDAGES/DRESSINGS) ×2 IMPLANT
CEMENT KYPHON CX01A KIT/MIXER (Cement) ×5 IMPLANT
COVER WAND RF STERILE (DRAPES) ×3 IMPLANT
DERMABOND ADVANCED (GAUZE/BANDAGES/DRESSINGS) ×2
DERMABOND ADVANCED .7 DNX12 (GAUZE/BANDAGES/DRESSINGS) ×1 IMPLANT
DEVICE BIOPSY BONE KYPH (INSTRUMENTS) ×2 IMPLANT
DEVICE BIOPSY BONE KYPHX (INSTRUMENTS) ×1 IMPLANT
DRAPE C-ARM XRAY 36X54 (DRAPES) ×3 IMPLANT
DURAPREP 26ML APPLICATOR (WOUND CARE) ×3 IMPLANT
FEE RENTAL RFA GENERATOR (MISCELLANEOUS) IMPLANT
GLOVE SURG SYN 9.0  PF PI (GLOVE) ×2
GLOVE SURG SYN 9.0 PF PI (GLOVE) ×1 IMPLANT
GOWN SRG 2XL LVL 4 RGLN SLV (GOWNS) ×1 IMPLANT
GOWN STRL NON-REIN 2XL LVL4 (GOWNS) ×2
GOWN STRL REUS W/ TWL LRG LVL3 (GOWN DISPOSABLE) ×1 IMPLANT
GOWN STRL REUS W/TWL LRG LVL3 (GOWN DISPOSABLE) ×2
PACK KYPHOPLASTY (MISCELLANEOUS) ×3 IMPLANT
RENTAL RFA  GENERATOR (MISCELLANEOUS)
RENTAL RFA GENERATOR (MISCELLANEOUS) IMPLANT
STRAP SAFETY 5IN WIDE (MISCELLANEOUS) ×3 IMPLANT
TRAY KYPHOPAK 15/2 EXPRESS (KITS) ×2 IMPLANT
TRAY KYPHOPAK 15/3 EXPRESS 1ST (MISCELLANEOUS) ×3 IMPLANT
TRAY KYPHOPAK 20/3 EXPRESS 1ST (MISCELLANEOUS) ×1 IMPLANT

## 2019-05-29 NOTE — Progress Notes (Signed)
Patient has equal grip strength to both hands bilaterally, equal push pull bilaterally.  Patient denies any pain.  Resting comfortably with knees slightly bent.

## 2019-05-29 NOTE — Discharge Instructions (Addendum)
Take it easy today and tomorrow and try to resume more normal activities on Thursday.  Try not to lift anything over 5 pounds for the next 2 weeks.  Remove Band-Aid on Thursday then okay to shower.  Pain medicine as directed.  Call office if you are having problems.  AMBULATORY SURGERY  DISCHARGE INSTRUCTIONS   1) The drugs that you were given will stay in your system until tomorrow so for the next 24 hours you should not:  A) Drive an automobile B) Make any legal decisions C) Drink any alcoholic beverage   2) You may resume regular meals tomorrow.  Today it is better to start with liquids and gradually work up to solid foods.  You may eat anything you prefer, but it is better to start with liquids, then soup and crackers, and gradually work up to solid foods.   3) Please notify your doctor immediately if you have any unusual bleeding, trouble breathing, redness and pain at the surgery site, drainage, fever, or pain not relieved by medication.    4) Additional Instructions:        Please contact your physician with any problems or Same Day Surgery at [phone], Monday through Friday 6 am to 4 pm, or [place] at [facility] number at [phone].

## 2019-05-29 NOTE — Anesthesia Postprocedure Evaluation (Signed)
Anesthesia Post Note  Patient: Gabriella Wiggins  Procedure(s) Performed: T11 KYPHOPLASTY (N/A Back)  Patient location during evaluation: PACU Anesthesia Type: MAC Level of consciousness: awake and alert Pain management: pain level controlled Vital Signs Assessment: post-procedure vital signs reviewed and stable Respiratory status: spontaneous breathing, nonlabored ventilation, respiratory function stable and patient connected to nasal cannula oxygen Cardiovascular status: stable and blood pressure returned to baseline Postop Assessment: no apparent nausea or vomiting Anesthetic complications: no     Last Vitals:  Vitals:   05/29/19 1652 05/29/19 1703  BP: (!) 154/85 (!) 162/79  Pulse: 81 95  Resp: 16   Temp:  (!) 36.3 C  SpO2: 100% 97%    Last Pain:  Vitals:   05/29/19 1703  TempSrc: Temporal  PainSc: [place]

## 2019-05-29 NOTE — Op Note (Signed)
Date May 29, 2019  time 4:17 PM   PATIENT:  Gabriella Wiggins   PRE-OPERATIVE DIAGNOSIS:  closed wedge compression fracture of T11   POST-OPERATIVE DIAGNOSIS:  closed wedge compression fracture of T11   PROCEDURE:  Procedure(s): KYPHOPLASTY T11  SURGEON: Laurene Footman, MD   ASSISTANTS: None   ANESTHESIA:   local and MAC   EBL:  No intake/output data recorded.   BLOOD ADMINISTERED:none   DRAINS: none    LOCAL MEDICATIONS USED:  MARCAINE    and XYLOCAINE    SPECIMEN:   T11 vertebral body biopsy   DISPOSITION OF SPECIMEN:  Pathology   COUNTS:  YES   TOURNIQUET:  * No tourniquets in log *   IMPLANTS: Bone cement   DICTATION: .Dragon Dictation  patient was brought to the operating room and after adequate anesthesia was obtained the patient was placed prone.  C arm was brought in in good visualization of the affected level obtained on both AP and lateral projections.  After patient identification and timeout procedures were completed, local anesthetic was infiltrated with 10 cc 1% Xylocaine infiltrated subcutaneously.  This is done the area on the right side of the planned approach.  The back was then prepped and draped in the usual sterile manner and repeat timeout procedure carried out.  A spinal needle was brought down to the pedicle on the right side of  T11 and a 50-50 mix of 1% Xylocaine half percent Sensorcaine with epinephrine total of 20 cc injected.  After allowing this to set a small incision was made and the trocar was advanced into the vertebral body in an extrapedicular fashion.  Biopsy was obtained Drilling was carried out balloon inserted with inflation to  to cc.  When the cement was appropriate consistency to cc were injected into the vertebral body with some extravasation into the T11-12 disc space and a small amount posterior to the vertebral body anterior to the posterior longitudinal ligament, good fill superior to inferior endplates and from right to left sides  along the inferior endplate.  After the cement had set the trochar was removed and permanent C-arm views obtained.  The wound was closed with Dermabond followed by Band-Aid   PLAN OF CARE: Discharge to home after PACU   PATIENT DISPOSITION:  PACU - hemodynamically stable.

## 2019-05-29 NOTE — H&P (Signed)
Reviewed paper H+P, will be scanned into chart. No changes noted.  

## 2019-05-29 NOTE — Anesthesia Preprocedure Evaluation (Addendum)
Anesthesia Evaluation  Patient identified by MRN, date of birth, ID band Patient awake    Reviewed: Allergy & Precautions, H&P , NPO status , Patient's Chart, lab work & pertinent test results  Airway Mallampati: II  TM Distance: >3 FB     Dental  (+) Chipped   Pulmonary shortness of breath and Long-Term Oxygen Therapy, COPD,  COPD inhaler and oxygen dependent, former smoker,  Wears 4L [place] around the clock at home          Cardiovascular (-) angina+ CAD  (-) Past MI and (-) Cardiac Stents   Echo December 2019:  Normal EF.  Grade 1 diastolic dysfunction.  Dilated RV and RA with moderately reduced RV function.  PA pressures unable to be assessed   Neuro/Psych negative neurological ROS  negative psych ROS   GI/Hepatic negative GI ROS, Neg liver ROS,   Endo/Other  negative endocrine ROS  Renal/GU negative Renal ROS  negative genitourinary   Musculoskeletal   Abdominal   Peds  Hematology negative hematology ROS (+)   Anesthesia Other Findings Past Medical History: No date: Aortic atherosclerosis ([place])     Comment:  a. 08/2018 noted on CT 2004: Breast cancer ([place])     Comment:  left lumpectomy No date: Cachexia ([place]) No date: COPD (chronic obstructive pulmonary disease) ([facility]) No date: Coronary artery calcification seen on CT scan     Comment:  a. 08/2018 seen on CT. No date: Personal history of radiation therapy No date: Post-menopause bleeding No date: Right renal artery stenosis ([place])     Comment:  a. 08/2018 CTA Abd/Lower ext: Moderate R RA stenosis @               origin. No date: Tobacco abuse     Comment:  a. at least 56 y pack history.  Past Surgical History: No date: APPENDECTOMY No date: bilateral tubal No date: BREAST BIOPSY; Right     Comment:  milk duct removed -neg 2004: BREAST BIOPSY; Left     Comment:  lumpectomy/rad No date: BREAST LUMPECTOMY; Left 06/20/2017: DILATATION &  CURETTAGE/HYSTEROSCOPY WITH MYOSURE; N/A     Comment:  Procedure: DILATATION & CURETTAGE/HYSTEROSCOPY WITH               MYOSURE;  Surgeon: Schermerhorn, Gwen Her, MD;  Location:              [facility] ORS;  Service: Gynecology;  Laterality: N/A; 06/20/2017: HYSTEROSCOPY W/D&C; N/A     Comment:  Procedure: DILATATION AND CURETTAGE /HYSTEROSCOPY;                Surgeon: Schermerhorn, Gwen Her, MD;  Location: [facility] ORS;              Service: Gynecology;  Laterality: N/A; No date: OVARIAN CYST SURGERY  BMI    Body Mass Index: 17.92 kg/m      Reproductive/Obstetrics negative OB ROS                           Anesthesia Physical Anesthesia Plan  ASA: III  Anesthesia Plan: General   Post-op Pain Management:    Induction:   PONV Risk Score and Plan: Propofol infusion and TIVA  Airway Management Planned: Natural Airway and Simple Face Mask  Additional Equipment:   Intra-op Plan:   Post-operative Plan:   Informed Consent: I have reviewed the patients History and Physical, chart, labs and discussed the procedure including the risks, benefits and  alternatives for the proposed anesthesia with the patient or authorized representative who has indicated his/her understanding and acceptance.     Dental Advisory Given  Plan Discussed with: Anesthesiologist and CRNA  Anesthesia Plan Comments:        Anesthesia Quick Evaluation

## 2019-05-29 NOTE — Transfer of Care (Signed)
Immediate Anesthesia Transfer of Care Note  Patient: Gabriella Wiggins  Procedure(s) Performed: T11 KYPHOPLASTY (N/A Back)  Patient Location: PACU  Anesthesia Type:MAC  Level of Consciousness: awake, alert  and patient cooperative  Airway & Oxygen Therapy: Patient Spontanous Breathing and Patient connected to face mask oxygen  Post-op Assessment: Report given to RN and Post -op Vital signs reviewed and stable  Post vital signs: Reviewed and stable  Last Vitals:  Vitals Value Taken Time  BP 178/90 05/29/19 1625  Temp    Pulse 91 05/29/19 1623  Resp 17 05/29/19 1624  SpO2 100 % 05/29/19 1623  Vitals shown include unvalidated device data.  Last Pain:  Vitals:   05/29/19 1328  TempSrc: Tympanic  PainSc: 0-No pain         Complications: No apparent anesthesia complications

## 2019-05-29 NOTE — Anesthesia Post-op Follow-up Note (Signed)
Anesthesia QCDR form completed.        

## 2019-06-01 LAB — SURGICAL PATHOLOGY

## 2019-06-07 ENCOUNTER — Telehealth: Payer: Self-pay | Admitting: Family Medicine

## 2019-06-07 NOTE — Chronic Care Management (AMB) (Signed)
Chronic Care Management  ° °Note ° °06/07/2019 °Name: Gabriella Wiggins MRN: [id number] DOB: 03/29/1941 ° °[name] is a 78 y.o. year old female who is a primary care patient of Jones, Deanna C, MD. I reached out to Oceane Pint by phone today in response to a referral sent by [name]'s health plan.   ° °[name] was given information about Chronic Care Management services today including:  °1. CCM service includes personalized support from designated clinical staff supervised by her physician, including individualized plan of care and coordination with other care providers °2. 24/7 contact phone numbers for assistance for urgent and routine care needs. °3. Service will only be billed when office clinical staff spend 20 minutes or more in a month to coordinate care. °4. Only one practitioner may furnish and bill the service in a calendar month. °5. The patient may stop CCM services at any time (effective at the end of the month) by phone call to the office staff. °6. The patient will be responsible for cost sharing (co-pay) of up to 20% of the service fee (after annual deductible is met). ° °Patient agreed to services and verbal consent obtained.  ° °Follow up plan: °Telephone appointment with CCM team member scheduled for: 06/28/2019 ° °Bernice Cicero °Care Guide • [facility] °[place]   Connected [facility]  °[email or web address]   ??[phone]   ° ° ° °

## 2019-06-07 NOTE — Chronic Care Management (AMB) (Signed)
Chronic Care Management   Note  06/07/2019 Name: Denina Rieger MRN: [id number] DOB: 11/06/1940  Arelie Kuzel is a 78 y.o. year old female who is a primary care patient of Juline Patch, MD. I reached out to Lynelle Doctor by phone today in response to a referral sent by Ms. Delfin Edis health plan.    Ms. Yniguez was given information about Chronic Care Management services today including:  1. CCM service includes personalized support from designated clinical staff supervised by her physician, including individualized plan of care and coordination with other care providers 2. 24/7 contact phone numbers for assistance for urgent and routine care needs. 3. Service will only be billed when office clinical staff spend 20 minutes or more in a month to coordinate care. 4. Only one practitioner may furnish and bill the service in a calendar month. 5. The patient may stop CCM services at any time (effective at the end of the month) by phone call to the office staff. 6. The patient will be responsible for cost sharing (co-pay) of up to 20% of the service fee (after annual deductible is met).  Patient agreed to services and verbal consent obtained.   Follow up plan: Telephone appointment with CCM team member scheduled for: 06/28/2019  [place]  [email or web address]   ??[phone]

## 2019-06-07 NOTE — Addendum Note (Signed)
Addended by: Clerance Lav on: 06/07/2019 01:06 PM   Modules accepted: Level of Service, SmartSet

## 2019-06-07 NOTE — Telephone Encounter (Signed)
This encounter was created in error - please disregard.

## 2019-06-13 DIAGNOSIS — S22080G Wedge compression fracture of T11-T12 vertebra, subsequent encounter for fracture with delayed healing: Secondary | ICD-10-CM | POA: Diagnosis not present

## 2019-06-14 ENCOUNTER — Other Ambulatory Visit: Payer: Self-pay | Admitting: Family Medicine

## 2019-06-14 DIAGNOSIS — J432 Centrilobular emphysema: Secondary | ICD-10-CM

## 2019-06-18 DIAGNOSIS — M810 Age-related osteoporosis without current pathological fracture: Secondary | ICD-10-CM | POA: Diagnosis not present

## 2019-06-20 LAB — HM DEXA SCAN

## 2019-06-28 ENCOUNTER — Telehealth: Payer: Self-pay

## 2019-06-28 ENCOUNTER — Telehealth: Payer: PPO

## 2019-07-05 ENCOUNTER — Other Ambulatory Visit: Payer: Self-pay | Admitting: Family Medicine

## 2019-07-05 DIAGNOSIS — R609 Edema, unspecified: Secondary | ICD-10-CM

## 2019-08-13 ENCOUNTER — Other Ambulatory Visit: Payer: Self-pay | Admitting: Family Medicine

## 2019-08-13 DIAGNOSIS — R609 Edema, unspecified: Secondary | ICD-10-CM

## 2019-09-07 ENCOUNTER — Other Ambulatory Visit: Payer: Self-pay | Admitting: Family Medicine

## 2019-09-07 DIAGNOSIS — J432 Centrilobular emphysema: Secondary | ICD-10-CM

## 2019-09-14 ENCOUNTER — Other Ambulatory Visit: Payer: Self-pay | Admitting: Family Medicine

## 2019-09-14 DIAGNOSIS — R609 Edema, unspecified: Secondary | ICD-10-CM

## 2019-09-30 ENCOUNTER — Other Ambulatory Visit: Payer: Self-pay | Admitting: Family Medicine

## 2019-09-30 DIAGNOSIS — R609 Edema, unspecified: Secondary | ICD-10-CM

## 2019-10-06 ENCOUNTER — Other Ambulatory Visit: Payer: Self-pay | Admitting: Family Medicine

## 2019-10-06 DIAGNOSIS — J432 Centrilobular emphysema: Secondary | ICD-10-CM

## 2019-10-16 ENCOUNTER — Ambulatory Visit: Payer: Self-pay | Admitting: Family Medicine

## 2019-10-24 ENCOUNTER — Encounter: Payer: Self-pay | Admitting: Family Medicine

## 2019-10-24 ENCOUNTER — Other Ambulatory Visit: Payer: Self-pay

## 2019-10-24 ENCOUNTER — Ambulatory Visit (INDEPENDENT_AMBULATORY_CARE_PROVIDER_SITE_OTHER): Payer: PPO | Admitting: Family Medicine

## 2019-10-24 VITALS — BP 118/76 | HR 84 | Ht 62.0 in | Wt 94.0 lb

## 2019-10-24 DIAGNOSIS — I700 Atherosclerosis of aorta: Secondary | ICD-10-CM | POA: Diagnosis not present

## 2019-10-24 DIAGNOSIS — R601 Generalized edema: Secondary | ICD-10-CM | POA: Diagnosis not present

## 2019-10-24 DIAGNOSIS — I25119 Atherosclerotic heart disease of native coronary artery with unspecified angina pectoris: Secondary | ICD-10-CM | POA: Diagnosis not present

## 2019-10-24 DIAGNOSIS — J432 Centrilobular emphysema: Secondary | ICD-10-CM | POA: Diagnosis not present

## 2019-10-24 HISTORY — DX: Generalized edema: R60.1

## 2019-10-24 MED ORDER — PROAIR HFA 108 (90 BASE) MCG/ACT IN AERS: 1.0000 | INHALATION_SPRAY | Freq: Four times a day (QID) | RESPIRATORY_TRACT | 1 refills | Status: DC | PRN

## 2019-10-24 MED ORDER — FUROSEMIDE 20 MG PO TABS: 20.0000 mg | ORAL_TABLET | Freq: Every day | ORAL | 1 refills | Status: DC

## 2019-10-24 MED ORDER — IPRATROPIUM-ALBUTEROL 0.5-2.5 (3) MG/3ML IN SOLN: 3.0000 mL | Freq: Four times a day (QID) | RESPIRATORY_TRACT | 1 refills | Status: DC | PRN

## 2019-10-24 MED ORDER — COMBIVENT RESPIMAT 20-100 MCG/ACT IN AERS: INHALATION_SPRAY | RESPIRATORY_TRACT | 1 refills | Status: DC

## 2019-10-24 NOTE — Progress Notes (Signed)
Date:  10/24/2019   Name:  Gabriella Wiggins   DOB:  15-Mar-1941   MRN:  [id number]   Chief Complaint: COPD and Leg Swelling  Patient is a 79 year old female who presents for a medical refill exam. The patient reports the following problems: osteoporosis. Health maintenance has been reviewed influenza/mammogram  COPD She complains of difficulty breathing and shortness of breath. There is no chest tightness, cough, frequent throat clearing, hemoptysis, hoarse voice, sputum production or wheezing. This is a chronic problem. The current episode started more than 1 year ago. The problem occurs constantly. The problem has been waxing and waning. Pertinent negatives include no appetite change, chest pain, dyspnea on exertion, ear congestion, ear pain, fever, headaches, heartburn, malaise/fatigue, myalgias, nasal congestion, orthopnea, PND, postnasal drip, rhinorrhea, sneezing, sore throat, sweats, trouble swallowing or weight loss. Her symptoms are aggravated by any activity. Her symptoms are alleviated by beta-agonist, ipratropium and steroid inhaler. Her past medical history is significant for COPD.    Lab Results  Component Value Date   CREATININE 0.82 05/21/2019   BUN 14 05/21/2019   NA 142 05/21/2019   K 4.4 05/21/2019   CL 95 (L) 05/21/2019   CO2 30 (H) 05/21/2019   Lab Results  Component Value Date   CHOL 150 09/01/2018   HDL 21 (L) 09/01/2018   LDLCALC 111 (H) 09/01/2018   TRIG 91 09/01/2018   CHOLHDL 7.1 09/01/2018   Lab Results  Component Value Date   TSH 1.570 06/06/2018   No results found for: HGBA1C   Review of Systems  Constitutional: Negative.  Negative for appetite change, chills, fatigue, fever, malaise/fatigue, unexpected weight change and weight loss.  HENT: Negative for congestion, ear discharge, ear pain, hoarse voice, postnasal drip, rhinorrhea, sinus pressure, sneezing, sore throat and trouble swallowing.   Eyes: Negative for photophobia, pain, discharge, redness  and itching.  Respiratory: Positive for shortness of breath. Negative for cough, hemoptysis, sputum production, wheezing and stridor.   Cardiovascular: Negative for chest pain, dyspnea on exertion and PND.  Gastrointestinal: Negative for abdominal pain, blood in stool, constipation, diarrhea, heartburn, nausea and vomiting.  Endocrine: Negative for cold intolerance, heat intolerance, polydipsia, polyphagia and polyuria.  Genitourinary: Negative for dysuria, flank pain, frequency, hematuria, menstrual problem, pelvic pain, urgency, vaginal bleeding and vaginal discharge.  Musculoskeletal: Negative for arthralgias, back pain and myalgias.  Skin: Negative for rash.  Allergic/Immunologic: Negative for environmental allergies and food allergies.  Neurological: Negative for dizziness, weakness, light-headedness, numbness and headaches.  Hematological: Negative for adenopathy. Does not bruise/bleed easily.  Psychiatric/Behavioral: Negative for dysphoric mood. The patient is not nervous/anxious.     Patient Active Problem List   Diagnosis Date Noted  . Atherosclerosis of native coronary artery of native heart with angina pectoris ([place]) 05/21/2019  . Protein-calorie malnutrition, severe 09/02/2018  . Acute respiratory failure with hypoxia ([place]) 08/31/2018  . Atherosclerosis of aorta ([place]) 06/06/2018  . PMB (postmenopausal bleeding) 06/27/2017  . Genetic testing 06/08/2017  . Centrilobular emphysema ([place]) 09/09/2016  . Hematuria, gross 07/24/2015    Allergies  Allergen Reactions  . Penicillin G Other (See Comments)  . Penicillins Hives    Has patient had a PCN reaction causing immediate rash, facial/tongue/throat swelling, SOB or lightheadedness with hypotension: No Has patient had a PCN reaction causing severe rash involving mucus membranes or skin necrosis: No Has patient had a PCN reaction that required hospitalization: No Has patient had a PCN reaction occurring within the last 10 years:  No  If all of the above answers are "NO", then may proceed with Cephalosporin use.   Marland Kitchen Shrimp [Shellfish Allergy] Swelling    Past Surgical History:  Procedure Laterality Date  . APPENDECTOMY    . bilateral tubal    . BREAST BIOPSY Right    milk duct removed -neg  . BREAST BIOPSY Left 2004   lumpectomy/rad  . BREAST LUMPECTOMY Left   . DILATATION & CURETTAGE/HYSTEROSCOPY WITH MYOSURE N/A 06/20/2017   Procedure: DILATATION & CURETTAGE/HYSTEROSCOPY WITH MYOSURE;  Surgeon: Schermerhorn, Gwen Her, MD;  Location: [facility] ORS;  Service: Gynecology;  Laterality: N/A;  . HYSTEROSCOPY WITH D & C N/A 06/20/2017   Procedure: DILATATION AND CURETTAGE /HYSTEROSCOPY;  Surgeon: Schermerhorn, Gwen Her, MD;  Location: [facility] OR[facility];  Service: Gynecology;  Laterality: N/A;  . KYPHOPLASTY N/A 05/29/2019   Procedure: T11 KYPHOPLASTY;  Surgeon: Hessie Knows, MD;  Location: [facility] OR[facility];  Service: Orthopedics;  Laterality: N/A;  . OVARIAN CYST SURGERY      Social History   Tobacco Use  . Smoking status: Former Smoker    Packs/day: 1.00    Years: 55.00    Pack years: 55.00    Types: Cigarettes    Quit date: 08/27/2018    Years since quitting: 1.1  . Smokeless tobacco: Never Used  Substance Use Topics  . Alcohol use: No    Alcohol/week: 0.0 standard drinks  . Drug use: No     Medication list has been reviewed and updated.  Current Meds  Medication Sig  . acetaminophen (TYLENOL) 325 MG tablet Take 1 tablet (325 mg total) by mouth every 6 (six) hours as needed for mild pain (or Fever >/= 101).  Marland Kitchen aspirin EC 81 MG tablet Take 81 mg by mouth every other day.   . carboxymethylcellulose (REFRESH PLUS) 0.5 % SOLN Place 1 drop into both eyes 2 (two) times daily as needed (dry eyes).  . cholecalciferol (VITAMIN D3) 25 MCG (1000 UT) tablet Take 1,000 Units by mouth daily.  . COMBIVENT RESPIMAT 20-100 MCG/ACT AERS respimat INHALE 1 PUFF INTO THE LUNGS 2 (TWO) TIMES DAILY AS NEEDED FOR WHEEZING OR SHORTNESS OF BREATH.    . furosemide (LASIX) 20 MG tablet TAKE 1 TABLET BY MOUTH EVERY DAY  . HYDROcodone-acetaminophen (NORCO) 5-325 MG tablet Take 1 tablet by mouth every 6 (six) hours as needed for moderate pain.  Marland Kitchen ipratropium-albuterol (DUONEB) 0.5-2.5 (3) MG/3ML SOLN TAKE 3 MLS BY NEBULIZATION EVERY 6 (SIX) HOURS AS NEEDED.  Marland Kitchen PROAIR HFA 108 (90 Base) MCG/ACT inhaler INHALE 1-2 PUFFS INTO THE LUNGS EVERY 6 (SIX) HOURS AS NEEDED.  Marland Kitchen pyridOXINE (VITAMIN B-6) 100 MG tablet Take 200 mg by mouth daily.     PHQ 2/9 Scores 10/24/2019 09/01/2017 07/21/2017 04/08/2017  PHQ - 2 Score 0 0 0 0  PHQ- 9 Score 0 0 0 -    BP Readings from Last 3 Encounters:  10/24/19 118/76  05/29/19 (!) 162/79  05/21/19 130/80    Physical Exam Constitutional:      General: She is not in acute distress.    Appearance: She is not diaphoretic.  HENT:     Head: Normocephalic and atraumatic.     Right Ear: Tympanic membrane, ear canal and external ear normal.     Left Ear: Tympanic membrane, ear canal and external ear normal.     Nose: Nose normal. No congestion or rhinorrhea.     Mouth/Throat:     Mouth: Mucous membranes are moist.  Eyes:  General:        Right eye: No discharge.        Left eye: No discharge.     Conjunctiva/sclera: Conjunctivae normal.     Pupils: Pupils are equal, round, and reactive to light.  Neck:     Thyroid: No thyromegaly.     Vascular: No carotid bruit or JVD.  Cardiovascular:     Rate and Rhythm: Normal rate and regular rhythm.     Pulses: Normal pulses.     Heart sounds: Normal heart sounds, S1 normal and S2 normal. No murmur. No systolic murmur. No diastolic murmur. No friction rub. No gallop. No S3 or S4 sounds.   Pulmonary:     Effort: Pulmonary effort is normal.     Breath sounds: Normal breath sounds. Decreased air movement present. No decreased breath sounds, wheezing, rhonchi or rales.  Abdominal:     General: Bowel sounds are normal.     Palpations: Abdomen is soft. There is no mass.      Tenderness: There is no abdominal tenderness. There is no guarding.  Musculoskeletal:        General: Normal range of motion.     Cervical back: Normal range of motion and neck supple.     Right lower leg: No edema.     Left lower leg: No edema.  Lymphadenopathy:     Cervical: No cervical adenopathy.  Skin:    General: Skin is warm and dry.     Capillary Refill: Capillary refill takes less than 2 seconds.  Neurological:     Mental Status: She is alert.     Deep Tendon Reflexes: Reflexes are normal and symmetric.     Wt Readings from Last 3 Encounters:  10/24/19 94 lb (42.6 kg)  05/29/19 98 lb (44.5 kg)  05/21/19 98 lb (44.5 kg)    BP 118/76   Pulse 84   Ht 5\' 2"  (1.575 m)   Wt 94 lb (42.6 kg)   SpO2 100%   BMI 17.19 kg/m   Assessment and Plan:  1. Centrilobular emphysema (HCC) Chronic.  Severe.  Relatively controlled.  Patient is on oxygen via oxygen extraction.  Patient currently is on alternating therapy with ProAir and Combivent.  It has been suggested that we may need to improve this regimen even though she is doing well on it.  I have suggested and ordered to have patient have a pulmonary consult.  This is to ensure that if there is any downward progression of the patient's concern in the future that we have pulmonary opportunities available in the future. - PROAIR HFA 108 (90 Base) MCG/ACT inhaler; Inhale 1-2 puffs into the lungs every 6 (six) hours as needed.  Dispense: 18 g; Refill: 1 - Ipratropium-Albuterol (COMBIVENT RESPIMAT) 20-100 MCG/ACT AERS respimat; INHALE 1 PUFF INTO THE LUNGS 2 (TWO) TIMES DAILY AS NEEDED FOR WHEEZING OR SHORTNESS OF BREATH.  Dispense: 4 g; Refill: 1 - Ambulatory referral to Pulmonology  2. Generalized edema Chronic.  Controlled.  Stable.  Continue Lasix 20 mg once a day. - furosemide (LASIX) 20 MG tablet; Take 1 tablet (20 mg total) by mouth daily.  Dispense: 90 tablet; Refill: 1  3. Atherosclerosis of aorta ([place]) Patient has been  encouraged to use low-cholesterol diet as well as continue her low-dose aspirin  4. Atherosclerosis of native coronary artery of native heart with angina pectoris [facility]) Patient has been given a Mediterranean diet to follow and to continue her 81 mg enteric-coated aspirin.

## 2019-10-29 DIAGNOSIS — R0602 Shortness of breath: Secondary | ICD-10-CM | POA: Diagnosis not present

## 2019-10-29 DIAGNOSIS — Z9981 Dependence on supplemental oxygen: Secondary | ICD-10-CM | POA: Diagnosis not present

## 2019-10-29 DIAGNOSIS — I272 Pulmonary hypertension, unspecified: Secondary | ICD-10-CM | POA: Diagnosis not present

## 2019-10-29 DIAGNOSIS — J432 Centrilobular emphysema: Secondary | ICD-10-CM | POA: Diagnosis not present

## 2019-11-19 DIAGNOSIS — J432 Centrilobular emphysema: Secondary | ICD-10-CM | POA: Diagnosis not present

## 2019-11-19 DIAGNOSIS — R0602 Shortness of breath: Secondary | ICD-10-CM | POA: Diagnosis not present

## 2020-01-04 ENCOUNTER — Other Ambulatory Visit: Payer: Self-pay | Admitting: Family Medicine

## 2020-01-04 DIAGNOSIS — J432 Centrilobular emphysema: Secondary | ICD-10-CM

## 2020-01-08 ENCOUNTER — Other Ambulatory Visit: Payer: Self-pay | Admitting: Family Medicine

## 2020-01-08 DIAGNOSIS — J432 Centrilobular emphysema: Secondary | ICD-10-CM

## 2020-01-31 ENCOUNTER — Other Ambulatory Visit: Payer: Self-pay | Admitting: Family Medicine

## 2020-01-31 DIAGNOSIS — J432 Centrilobular emphysema: Secondary | ICD-10-CM

## 2020-02-18 ENCOUNTER — Ambulatory Visit (INDEPENDENT_AMBULATORY_CARE_PROVIDER_SITE_OTHER): Payer: PPO

## 2020-02-18 DIAGNOSIS — Z0000 Encounter for general adult medical examination without abnormal findings: Secondary | ICD-10-CM

## 2020-02-18 NOTE — Patient Instructions (Signed)
Gabriella Wiggins , Thank you for taking time to come for your Medicare Wellness Visit. I appreciate your ongoing commitment to your health goals. Please review the following plan we discussed and let me know if I can assist you in the future.   Screening recommendations/referrals: Colonoscopy: no longer required Mammogram: done 05/05/17 Bone Density: done 06/20/19 Recommended yearly ophthalmology/optometry visit for glaucoma screening and checkup Recommended yearly dental visit for hygiene and checkup  Vaccinations: Influenza vaccine: postponed Pneumococcal vaccine: done 07/21/17 Tdap vaccine: done 04/04/17 Shingles vaccine: Shingrix discussed. Please contact your pharmacy for coverage information.  Covid-19: done 10/16/19 & 11/10/19  Conditions/risks identified: Recommend increasing physical activity as tolerated  Next appointment: Please follow up in one year for your Medicare Annual Wellness visit.     Preventive Care 65 Years and Older, Female Preventive care refers to lifestyle choices and visits with your health care provider that can promote health and wellness. What does preventive care include?  A yearly physical exam. This is also called an annual well check.  Dental exams once or twice a year.  Routine eye exams. Ask your health care provider how often you should have your eyes checked.  Personal lifestyle choices, including:  Daily care of your teeth and gums.  Regular physical activity.  Eating a healthy diet.  Avoiding tobacco and drug use.  Limiting alcohol use.  Practicing safe sex.  Taking low-dose aspirin every day.  Taking vitamin and mineral supplements as recommended by your health care provider. What happens during an annual well check? The services and screenings done by your health care provider during your annual well check will depend on your age, overall health, lifestyle risk factors, and family history of disease. Counseling  Your health care  provider may ask you questions about your:  Alcohol use.  Tobacco use.  Drug use.  Emotional well-being.  Home and relationship well-being.  Sexual activity.  Eating habits.  History of falls.  Memory and ability to understand (cognition).  Work and work Statistician.  Reproductive health. Screening  You may have the following tests or measurements:  Height, weight, and BMI.  Blood pressure.  Lipid and cholesterol levels. These may be checked every 5 years, or more frequently if you are over 8 years old.  Skin check.  Lung cancer screening. You may have this screening every year starting at age 72 if you have a 30-pack-year history of smoking and currently smoke or have quit within the past 15 years.  Fecal occult blood test (FOBT) of the stool. You may have this test every year starting at age 82.  Flexible sigmoidoscopy or colonoscopy. You may have a sigmoidoscopy every 5 years or a colonoscopy every 10 years starting at age 69.  Hepatitis C blood test.  Hepatitis B blood test.  Sexually transmitted disease (STD) testing.  Diabetes screening. This is done by checking your blood sugar (glucose) after you have not eaten for a while (fasting). You may have this done every 1-3 years.  Bone density scan. This is done to screen for osteoporosis. You may have this done starting at age 69.  Mammogram. This may be done every 1-2 years. Talk to your health care provider about how often you should have regular mammograms. Talk with your health care provider about your test results, treatment options, and if necessary, the need for more tests. Vaccines  Your health care provider may recommend certain vaccines, such as:  Influenza vaccine. This is recommended every year.  Tetanus, diphtheria,  and acellular pertussis (Tdap, Td) vaccine. You may need a Td booster every 10 years.  Zoster vaccine. You may need this after age 40.  Pneumococcal 13-valent conjugate (PCV13)  vaccine. One dose is recommended after age 76.  Pneumococcal polysaccharide (PPSV23) vaccine. One dose is recommended after age 69. Talk to your health care provider about which screenings and vaccines you need and how often you need them. This information is not intended to replace advice given to you by your health care provider. Make sure you discuss any questions you have with your health care provider. Document Released: 10/10/2015 Document Revised: 06/02/2016 Document Reviewed: 07/15/2015 Elsevier Interactive Patient Education  2017 [place] Prevention in the Home Falls can cause injuries. They can happen to people of all ages. There are many things you can do to make your home safe and to help prevent falls. What can I do on the outside of my home?  Regularly fix the edges of walkways and driveways and fix any cracks.  Remove anything that might make you trip as you walk through a door, such as a raised step or threshold.  Trim any bushes or trees on the path to your home.  Use bright outdoor lighting.  Clear any walking paths of anything that might make someone trip, such as rocks or tools.  Regularly check to see if handrails are loose or broken. Make sure that both sides of any steps have handrails.  Any raised decks and porches should have guardrails on the edges.  Have any leaves, snow, or ice cleared regularly.  Use sand or salt on walking paths during winter.  Clean up any spills in your garage right away. This includes oil or grease spills. What can I do in the bathroom?  Use night lights.  Install grab bars by the toilet and in the tub and shower. Do not use towel bars as grab bars.  Use non-skid mats or decals in the tub or shower.  If you need to sit down in the shower, use a plastic, non-slip stool.  Keep the floor dry. Clean up any water that spills on the floor as soon as it happens.  Remove soap buildup in the tub or shower  regularly.  Attach bath mats securely with double-sided non-slip rug tape.  Do not have throw rugs and other things on the floor that can make you trip. What can I do in the bedroom?  Use night lights.  Make sure that you have a light by your bed that is easy to reach.  Do not use any sheets or blankets that are too big for your bed. They should not hang down onto the floor.  Have a firm chair that has side arms. You can use this for support while you get dressed.  Do not have throw rugs and other things on the floor that can make you trip. What can I do in the kitchen?  Clean up any spills right away.  Avoid walking on wet floors.  Keep items that you use a lot in easy-to-reach places.  If you need to reach something above you, use a strong step stool that has a grab bar.  Keep electrical cords out of the way.  Do not use floor polish or wax that makes floors slippery. If you must use wax, use non-skid floor wax.  Do not have throw rugs and other things on the floor that can make you trip. What can I do with my  stairs?  Do not leave any items on the stairs.  Make sure that there are handrails on both sides of the stairs and use them. Fix handrails that are broken or loose. Make sure that handrails are as long as the stairways.  Check any carpeting to make sure that it is firmly attached to the stairs. Fix any carpet that is loose or worn.  Avoid having throw rugs at the top or bottom of the stairs. If you do have throw rugs, attach them to the floor with carpet tape.  Make sure that you have a light switch at the top of the stairs and the bottom of the stairs. If you do not have them, ask someone to add them for you. What else can I do to help prevent falls?  Wear shoes that:  Do not have high heels.  Have rubber bottoms.  Are comfortable and fit you well.  Are closed at the toe. Do not wear sandals.  If you use a stepladder:  Make sure that it is fully  opened. Do not climb a closed stepladder.  Make sure that both sides of the stepladder are locked into place.  Ask someone to hold it for you, if possible.  Clearly mark and make sure that you can see:  Any grab bars or handrails.  First and last steps.  Where the edge of each step is.  Use tools that help you move around (mobility aids) if they are needed. These include:  Canes.  Walkers.  Scooters.  Crutches.  Turn on the lights when you go into a dark area. Replace any light bulbs as soon as they burn out.  Set up your furniture so you have a clear path. Avoid moving your furniture around.  If any of your floors are uneven, fix them.  If there are any pets around you, be aware of where they are.  Review your medicines with your doctor. Some medicines can make you feel dizzy. This can increase your chance of falling. Ask your doctor what other things that you can do to help prevent falls. This information is not intended to replace advice given to you by your health care provider. Make sure you discuss any questions you have with your health care provider. Document Released: 07/10/2009 Document Revised: 02/19/2016 Document Reviewed: 10/18/2014 Elsevier Interactive Patient Education  2017 [employer].

## 2020-02-18 NOTE — Progress Notes (Signed)
Subjective:   Gabriella Wiggins is a 79 y.o. female who presents for an Initial Medicare Annual Wellness Visit.  Virtual Visit via Telephone Note  I connected with  Lynelle Doctor on 02/18/20 at 10:40 AM EDT by telephone and verified that I am speaking with the correct person using two identifiers.  Medicare Annual Wellness visit completed telephonically due to Covid-19 pandemic.   Location: Patient: home Provider: office   I discussed the limitations, risks, security and privacy concerns of performing an evaluation and management service by telephone and the availability of in person appointments. The patient expressed understanding and agreed to proceed.  Unable to perform video visit due to patient does not have video capability.   Some vital signs may be absent or patient reported.   Clemetine Marker, LPN    Review of Systems      Cardiac Risk Factors include: advanced age (>72men, >45 women);dyslipidemia;sedentary lifestyle     Objective:    There were no vitals filed for this visit. There is no height or weight on file to calculate BMI.  Advanced Directives 02/18/2020 08/31/2018 08/31/2018 08/31/2018 09/01/2017 06/20/2017 06/10/2017  Does Patient Have a Medical Advance Directive? Yes Yes Yes Yes Yes Yes Yes  Type of Advance Directive Out of facility DNR (pink MOST or yellow form) [place];Living will [place];Living will [place];Living will [place];Living will [place];Living will Living will;Healthcare Power of Attorney  Does patient want to make changes to medical advance directive? - No - Patient declined No - Patient declined - - No - Patient declined -  Copy of [place] in Chart? - No - copy requested No - copy requested - No - copy requested - -    Current Medications (verified) Outpatient Encounter Medications as of 02/18/2020  Medication Sig  .  acetaminophen (TYLENOL) 325 MG tablet Take 1 tablet (325 mg total) by mouth every 6 (six) hours as needed for mild pain (or Fever >/= 101).  Marland Kitchen aspirin EC 81 MG tablet Take 81 mg by mouth every other day.   Marland Kitchen BREO ELLIPTA 100-25 MCG/INH AEPB Inhale 1 puff into the lungs daily.  . carboxymethylcellulose (REFRESH PLUS) 0.5 % SOLN Place 1 drop into both eyes 2 (two) times daily as needed (dry eyes).  . cholecalciferol (VITAMIN D3) 25 MCG (1000 UT) tablet Take 1,000 Units by mouth daily.  . COMBIVENT RESPIMAT 20-100 MCG/ACT AERS respimat INHALE 1 PUFF INTO THE LUNGS 2 (TWO) TIMES DAILY AS NEEDED FOR WHEEZING OR SHORTNESS OF BREATH.  . furosemide (LASIX) 20 MG tablet Take 1 tablet (20 mg total) by mouth daily.  Marland Kitchen ipratropium-albuterol (DUONEB) 0.5-2.5 (3) MG/3ML SOLN Take 3 mLs by nebulization every 6 (six) hours as needed.  Marland Kitchen PROAIR HFA 108 (90 Base) MCG/ACT inhaler INHALE 1-2 PUFFS INTO THE LUNGS EVERY 6 (SIX) HOURS AS NEEDED.  Marland Kitchen pyridOXINE (VITAMIN B-6) 100 MG tablet Take 200 mg by mouth daily.   . [DISCONTINUED] HYDROcodone-acetaminophen (NORCO) 5-325 MG tablet Take 1 tablet by mouth every 6 (six) hours as needed for moderate pain.   No facility-administered encounter medications on file as of 02/18/2020.    Allergies (verified) Penicillin g, Penicillins, and Shrimp [shellfish allergy]   History: Past Medical History:  Diagnosis Date  . Aortic atherosclerosis ([place])    a. 08/2018 noted on CT  . Breast cancer ([place]) 2004   left lumpectomy  . Cachexia ([place])   . COPD (  chronic obstructive pulmonary disease) ([place])   . Coronary artery calcification seen on CT scan    a. 08/2018 seen on CT.  Marland Kitchen Personal history of radiation therapy   . Post-menopause bleeding   . Right renal artery stenosis ([place])    a. 08/2018 CTA Abd/Lower ext: Moderate R RA stenosis @ origin.  . Tobacco abuse    a. at least 52 y pack history.   Past Surgical History:  Procedure Laterality Date  . APPENDECTOMY    .  bilateral tubal    . BREAST BIOPSY Right    milk duct removed -neg  . BREAST BIOPSY Left 2004   lumpectomy/rad  . BREAST LUMPECTOMY Left   . DILATATION & CURETTAGE/HYSTEROSCOPY WITH MYOSURE N/A 06/20/2017   Procedure: DILATATION & CURETTAGE/HYSTEROSCOPY WITH MYOSURE;  Surgeon: Schermerhorn, Gwen Her, MD;  Location: [facility] ORS;  Service: Gynecology;  Laterality: N/A;  . HYSTEROSCOPY WITH D & C N/A 06/20/2017   Procedure: DILATATION AND CURETTAGE /HYSTEROSCOPY;  Surgeon: Schermerhorn, Gwen Her, MD;  Location: [facility] ORS;  Service: Gynecology;  Laterality: N/A;  . KYPHOPLASTY N/A 05/29/2019   Procedure: T11 KYPHOPLASTY;  Surgeon: Hessie Knows, MD;  Location: [facility] ORS;  Service: Orthopedics;  Laterality: N/A;  . OVARIAN CYST SURGERY     Family History  Problem Relation Age of Onset  . Heart disease Father   . Breast cancer Sister   . Diabetes Brother   . Breast cancer Maternal Aunt   . Breast cancer Paternal Aunt   . Breast cancer Cousin        cousins-both sides  . Cirrhosis Mother   . Bladder Cancer Sister   . Diabetes Brother   . Parkinson's disease Brother   . Healthy Sister   . Heart disease Sister   . Dementia Sister   . Arthritis Sister   . Breast cancer Sister   . Healthy Brother   . Healthy Brother   . Arthritis Brother   . Colon cancer Cousin    Social History   Socioeconomic History  . Marital status: Married    Spouse name: Not on file  . Number of children: 2  . Years of education: Not on file  . Highest education level: Not on file  Occupational History  . Occupation: retired  Tobacco Use  . Smoking status: Former Smoker    Packs/day: 1.00    Years: 55.00    Pack years: 55.00    Types: Cigarettes    Quit date: 08/27/2018    Years since quitting: 1.4  . Smokeless tobacco: Never Used  Substance and Sexual Activity  . Alcohol use: No    Alcohol/week: 0.0 standard drinks  . Drug use: No  . Sexual activity: Never  Other Topics Concern  . Not on file  Social  History Narrative   Lives in [place] with her husband.  Does not routinely exercise.   Social Determinants of Health   Financial Resource Strain: Low Risk   . Difficulty of Paying Living Expenses: Not hard at all  Food Insecurity: No Food Insecurity  . Worried About Charity fundraiser in the Last Year: Never true  . Ran Out of Food in the Last Year: Never true  Transportation Needs: No Transportation Needs  . Lack of Transportation (Medical): No  . Lack of Transportation (Non-Medical): No  Physical Activity: Inactive  . Days of Exercise per Week: 0 days  . Minutes of Exercise per Session: 0 min  Stress: No Stress Concern Present  .  Feeling of Stress : Not at all  Social Connections: Not Isolated  . Frequency of Communication with Friends and Family: More than three times a week  . Frequency of Social Gatherings with Friends and Family: Three times a week  . Attends Religious Services: 1 to 4 times per year  . Active Member of Clubs or Organizations: Yes  . Attends Archivist Meetings: More than 4 times per year  . Marital Status: Married    Tobacco Counseling Counseling given: Not Answered   Clinical Intake:  Pre-visit preparation completed: Yes  Pain : No/denies pain     Nutritional Risks: None Diabetes: No  How often do you need to have someone help you when you read instructions, pamphlets, or other written materials from your doctor or pharmacy?: 1 - Never  Interpreter Needed?: No  Information entered by :: Clemetine Marker LPN   Activities of Daily Living In your present state of health, do you have any difficulty performing the following activities: 02/18/2020  Hearing? N  Comment declines hearing aids  Vision? N  Difficulty concentrating or making decisions? N  Walking or climbing stairs? N  Dressing or bathing? N  Doing errands, shopping? N  Preparing Food and eating ? N  Using the Toilet? N  In the past six months, have you accidently leaked  urine? Y  Comment wears pads for protection  Do you have problems with loss of bowel control? N  Managing your Medications? N  Managing your Finances? N  Housekeeping or managing your Housekeeping? N  Some recent data might be hidden     Immunizations and Health Maintenance Immunization History  Administered Date(s) Administered  . PFIZER SARS-COV-2 Vaccination 10/16/2019, 11/10/2019  . PPD Test 06/06/2018  . Pneumococcal Conjugate-13 07/21/2017  . Pneumococcal Polysaccharide-23 10/28/2012  . Tdap 04/04/2017   There are no preventive care reminders to display for this patient.  Patient Care Team: Juline Patch, MD as PCP - General (Family Medicine) Rockey Situ Kathlene November, MD as PCP - Cardiology (Cardiology) Ilean China, RN as Registered Nurse [place], Dalbert Garnet, RN (Inactive) as [place] any recent [place] you may have received from other than [facility] providers in the past year (date may be approximate).     Assessment:   This is a routine wellness examination for Saraye.  Hearing/Vision screen  Hearing Screening   125Hz  250Hz  500Hz  1000Hz  2000Hz  3000Hz  4000Hz  6000Hz  8000Hz   Right ear:           Left ear:           Comments: Pt denies hearing difficulty  Vision Screening Comments: Sees [facility] in [place] for annual eye exams - past due for exam  Dietary issues and exercise activities discussed: Current Exercise Habits: The patient does not participate in regular exercise at present, Exercise limited by: respiratory conditions(s)  Goals    . water     Recommend to drink at least 6-8 8oz glasses of water per day      Depression Screen PHQ 2/9 Scores 02/18/2020 10/24/2019 09/01/2017 07/21/2017 04/08/2017 04/08/2016 10/08/2015  PHQ - 2 Score 0 0 0 0 0 0 0  PHQ- 9 Score - 0 0 0 - - -    Fall Risk Fall Risk  02/18/2020 10/24/2019 04/27/2019 09/01/2017 04/08/2017  Falls in the past year? 0 0 0 No No  Comment - - Emmi  Telephone Survey: data to providers prior to load - -  Number falls  in past yr: 0 - - - -  Injury with Fall? 0 - - - -  Risk for fall due to : Impaired balance/gait;Impaired mobility - - - -  Follow up Falls prevention discussed Falls evaluation completed - - -    FALL RISK PREVENTION PERTAINING TO THE HOME:  Any stairs in or around the home? Yes  If so, do they handrails? Yes   Home free of loose throw rugs in walkways, pet beds, electrical cords, etc? Yes  Adequate lighting in your home to reduce risk of falls? Yes   ASSISTIVE DEVICES UTILIZED TO PREVENT FALLS:  Life alert? No  Use of a cane, walker or w/c? Yes  Grab bars in the bathroom? Yes  Shower chair or bench in shower? Yes  Elevated toilet seat or a handicapped toilet? Yes   DME ORDERS:  DME order needed?  No   TIMED UP AND GO:  Was the test performed? No . Telephonic visit.   Education: Fall risk prevention has been discussed.  Intervention(s) required? No   Cognitive Function: 6CIT deferred for 2021 AWV; pt has no memory issues     6CIT Screen 09/01/2017  What Year? 0 points  What month? 0 points  What time? 0 points  Count back from 20 0 points  Months in reverse 0 points  Repeat phrase 0 points  Total Score 0    Screening Tests Health Maintenance  Topic Date Due  . MAMMOGRAM  10/23/2020 (Originally 05/05/2018)  . INFLUENZA VACCINE  04/27/2020  . TETANUS/TDAP  04/05/2027  . DEXA SCAN  Completed  . COVID-19 Vaccine  Completed  . PNA vac Low Risk Adult  Completed    Qualifies for Shingles Vaccine? Yes . Due for Shingrix. Education has been provided regarding the importance of this vaccine. Pt has been advised to call insurance company to determine out of pocket expense. Advised may also receive vaccine at local pharmacy or Health Dept. Verbalized acceptance and understanding.  Tdap: Up to date  Flu Vaccine: Due for Flu vaccine. Does the patient want to receive this vaccine today?  No . Education  has been provided regarding the importance of this vaccine but still declined. Advised may receive this vaccine at local pharmacy or Health Dept. Aware to provide a copy of the vaccination record if obtained from local pharmacy or Health Dept. Verbalized acceptance and understanding.  Pneumococcal Vaccine: Up to date  Covid-19 Vaccine: Up to date    Cancer Screenings:  Colorectal Screening:  No longer required.   Mammogram: Completed 05/05/17. Repeat every year; pt requests to postpone at this time.   Bone Density: Completed 06/20/19. Results reflect OSTEOPOROSIS. Repeat every 2 years.   Lung Cancer Screening: (Low Dose CT Chest recommended if Age 69-80 years, 30 pack-year currently smoking OR have quit w/in 15years.) does qualify.    Additional Screening:  Hepatitis C Screening: no longer required  Vision Screening: Recommended annual ophthalmology exams for early detection of glaucoma and other disorders of the eye. Is the patient up to date with their annual eye exam?  No  Who is the provider or what is the name of the office in which the pt attends annual eye exams? [facility]  Dental Screening: Recommended annual dental exams for proper oral hygiene  Community Resource Referral:  CRR required this visit?  No      Plan:    I have personally reviewed and addressed the Medicare Annual Wellness questionnaire and have noted the following in  the patient's chart:  A. Medical and social history B. Use of alcohol, tobacco or illicit drugs  C. Current medications and supplements D. Functional ability and status E.  Nutritional status F.  Physical activity G. Advance directives H. List of other physicians I.  Hospitalizations, surgeries, and ER visits in previous 12 months J.  [place] such as hearing and vision if needed, cognitive and depression L. Referrals and appointments   In addition, I have reviewed and discussed with patient certain  preventive protocols, quality metrics, and best practice recommendations. A written personalized care plan for preventive services as well as general preventive health recommendations were provided to patient.   Signed,  Clemetine Marker, LPN Nurse Health Advisor   Nurse Notes:

## 2020-02-21 ENCOUNTER — Other Ambulatory Visit: Payer: Self-pay | Admitting: Family Medicine

## 2020-02-21 DIAGNOSIS — J432 Centrilobular emphysema: Secondary | ICD-10-CM

## 2020-02-21 NOTE — Telephone Encounter (Signed)
Requested Prescriptions  Pending Prescriptions Disp Refills  . PROAIR HFA 108 (90 Base) MCG/ACT inhaler [Pharmacy Med Name: PROAIR HFA 90 MCG INHALER] 6.7 g 1    Sig: INHALE 1-2 PUFFS INTO THE LUNGS EVERY 6 (SIX) HOURS AS NEEDED.     Pulmonology:  Beta Agonists Failed - 02/21/2020  1:35 AM      Failed - One inhaler should last at least one month. If the patient is requesting refills earlier, contact the patient to check for uncontrolled symptoms.      Passed - Valid encounter within last 12 months    Recent Outpatient Visits          4 months ago Centrilobular emphysema ([place])   [facility] Juline Patch, [facility]   9 months ago Pain of upper abdomen   [facility] Juline Patch, [facility]   1 year ago Weight loss   [facility], [facility]   2 years ago Degenerative disc disease, lumbar   [facility] Jul[facility] Patch, MD   2 years ago Centrilobular emphysema [facility])   [facility] Juline Patch, MD      Future Appointments            In 2 months Juline Patch, MD [facility], [facility]

## 2020-02-24 ENCOUNTER — Other Ambulatory Visit: Payer: Self-pay | Admitting: Family Medicine

## 2020-02-24 DIAGNOSIS — J432 Centrilobular emphysema: Secondary | ICD-10-CM

## 2020-02-24 NOTE — Telephone Encounter (Signed)
Requested Prescriptions  Pending Prescriptions Disp Refills  . COMBIVENT RESPIMAT 20-100 MCG/ACT AERS respimat [Pharmacy Med Name: COMBIVENT RESPIMAT 20-100 MCG] 4 g 0    Sig: INHALE 1 PUFF INTO THE LUNGS 2 (TWO) TIMES DAILY AS NEEDED FOR WHEEZING OR SHORTNESS OF BREATH.     Pulmonology:  Combination Products Passed - 02/24/2020 12:31 PM      Passed - Valid encounter within last 12 months    Recent Outpatient Visits          4 months ago Centrilobular emphysema ([place])   [facility] Jul[facility] Patch, [facility]   9 months ago Pain of upper abdomen   [facility] Jul[facility], [facility]   1 year ago Weight loss   [facility] Jul[facility], MD   2 years ago Degenerative disc disease, lumbar   [facility] Juline Patch, MD   2 years ago Centrilobular emphysema [facility])   [facility] Jul[facility] Patch, [facility]      Future Appointments            In 1 month Juline Patch, MD [facility], [facility]

## 2020-03-18 DIAGNOSIS — R0600 Dyspnea, unspecified: Secondary | ICD-10-CM | POA: Diagnosis not present

## 2020-03-18 DIAGNOSIS — Z9981 Dependence on supplemental oxygen: Secondary | ICD-10-CM | POA: Diagnosis not present

## 2020-03-18 DIAGNOSIS — J449 Chronic obstructive pulmonary disease, unspecified: Secondary | ICD-10-CM | POA: Diagnosis not present

## 2020-03-19 ENCOUNTER — Other Ambulatory Visit: Payer: Self-pay | Admitting: Family Medicine

## 2020-03-19 DIAGNOSIS — J432 Centrilobular emphysema: Secondary | ICD-10-CM

## 2020-03-23 IMAGING — MR MRI THORACIC SPINE WITHOUT CONTRAST
4 of 5 series · 27 of 48 positions shown · non-contrast
Comparison: Plain films lumbar spine 05/21/2019.

CLINICAL DATA: Acute onset low back pain in early April 2019 when
the patient picked up a laundry basket. Thoracic spine compression
fracture on prior plain films. Initial encounter.

EXAM:
MRI THORACIC SPINE WITHOUT CONTRAST
TECHNIQUE: Multiplanar, multisequence MR imaging of the thoracic spine was
performed. No intravenous contrast was administered.

[Series 19: T2 · sagittal · 3.0mm · 1.06mm/px · 6 of 22 slices shown (1 of 2)]
[im 1/22]
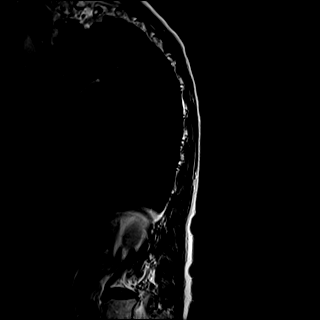
[im 5/22]
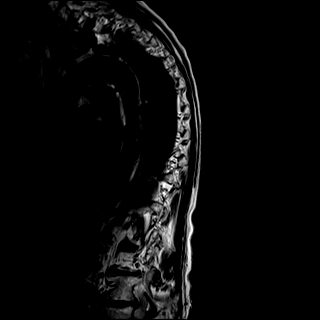
[im 9/22]
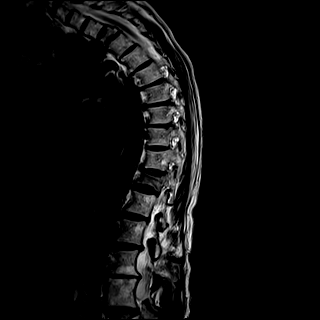
[im 13/22]
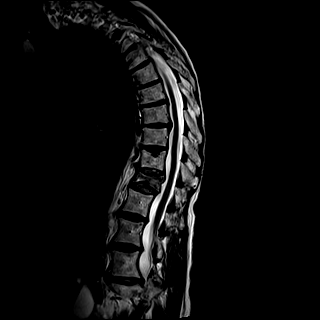
[im 17/22]
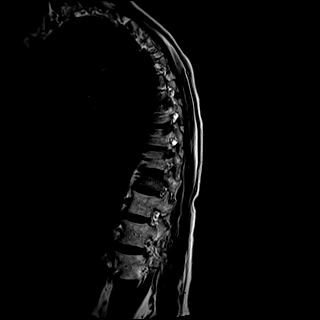
[im 22/22]
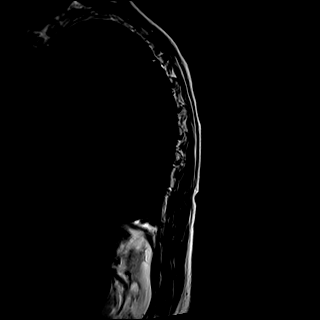

[Series 20: T1 · sagittal · 3.0mm · 1.06mm/px · 7 of 22 slices shown]
[im 1/22]
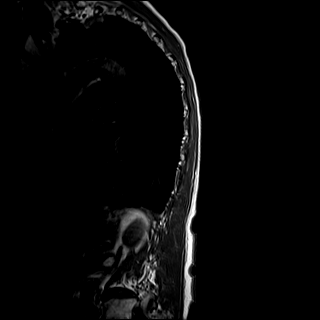
[im 4/22]
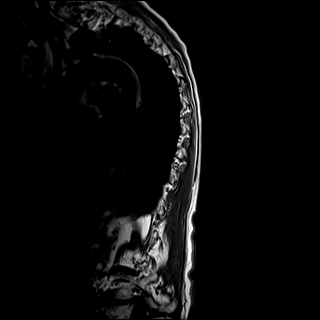
[im 8/22]
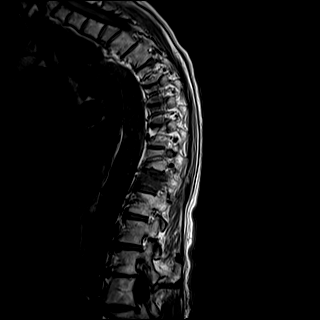
[im 11/22]
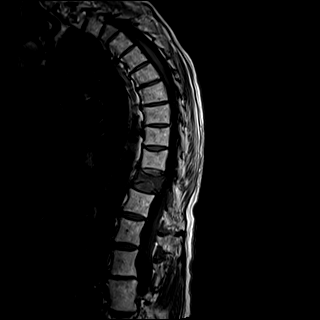
[im 15/22]
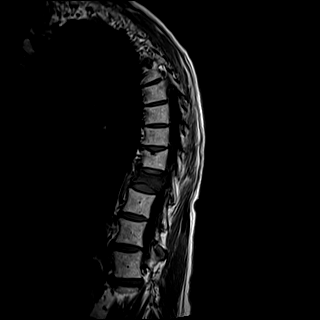
[im 18/22]
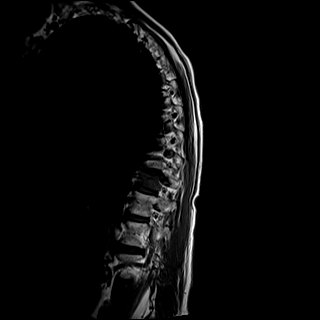
[im 22/22]
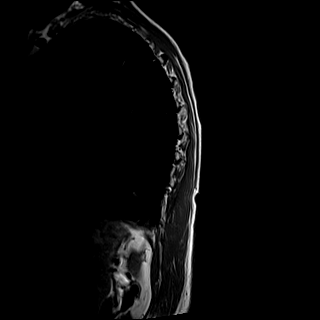

[Series 21: STIR · sagittal · 3.0mm · 0.53mm/px · 6 of 22 slices shown]
[im 1/22]
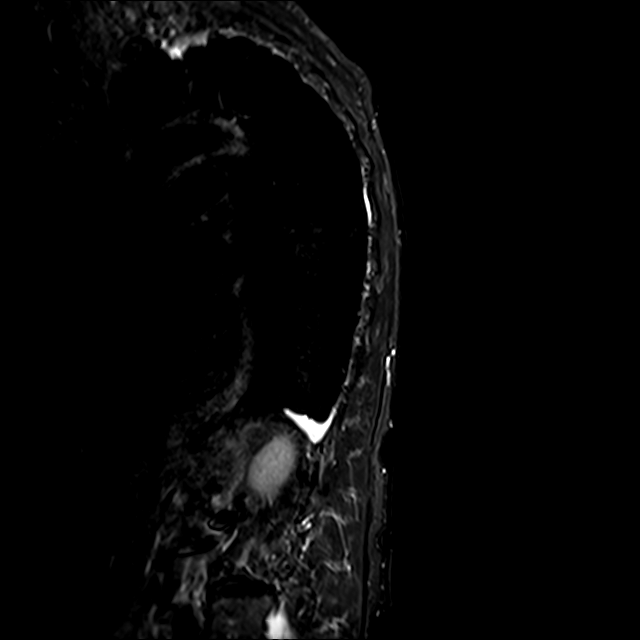
[im 4/22]
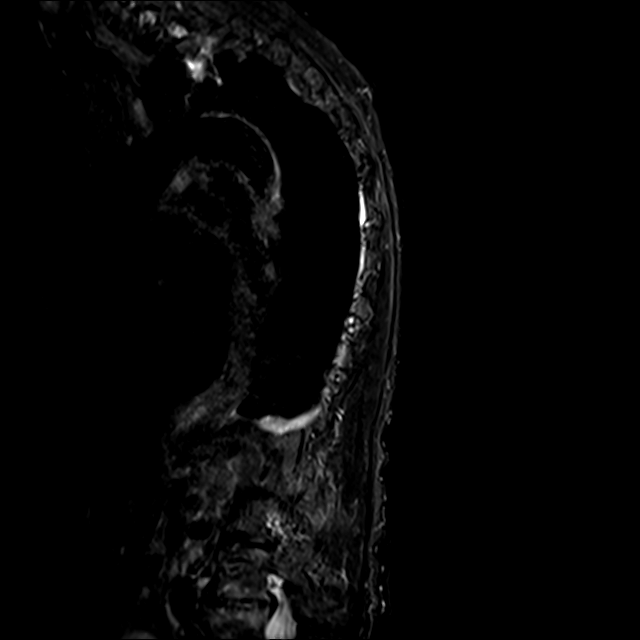
[im 8/22]
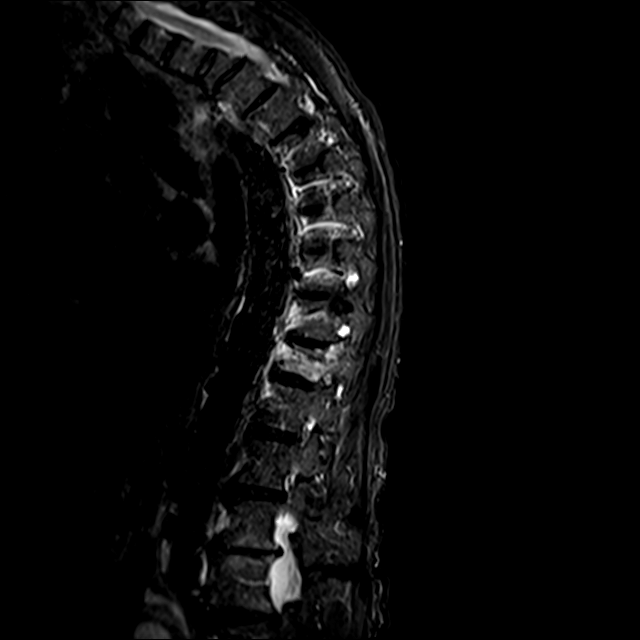
[im 11/22]
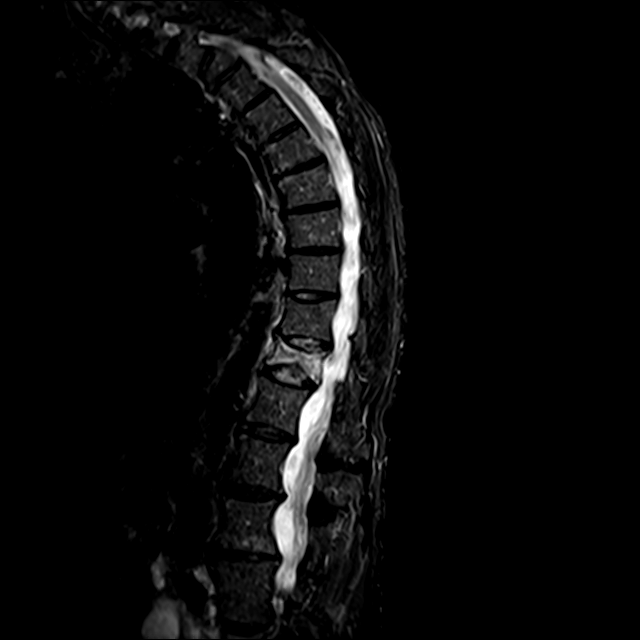
[im 15/22]
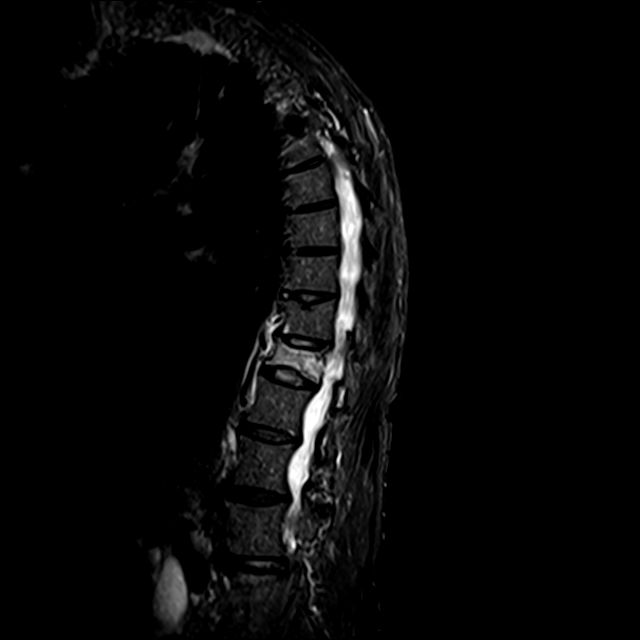
[im 18/22]
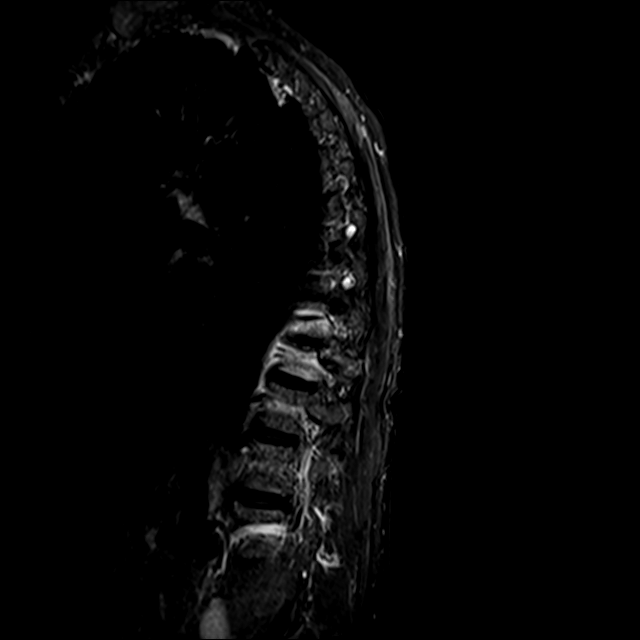

[Series 22: T2 · axial · 4.0mm · 0.59mm/px · z∈[-234,-65]mm · 8 of 44 slices shown (2 of 2)]
[im 1/44]
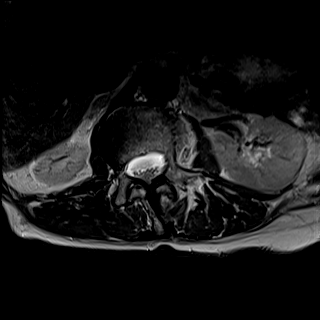
[im 7/44]
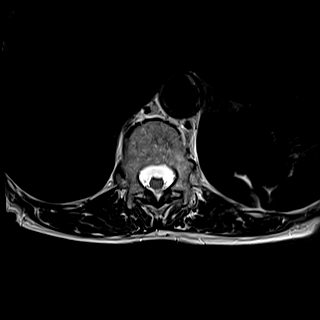
[im 14/44]
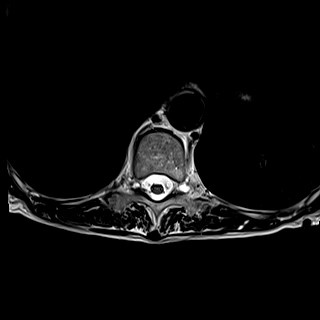
[im 20/44]
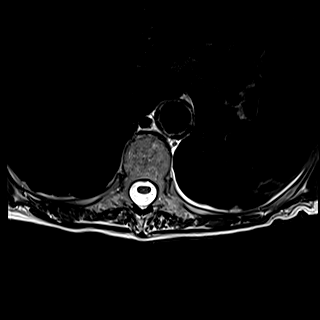
[im 24/44]
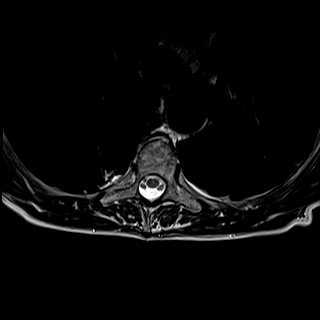
[im 30/44]
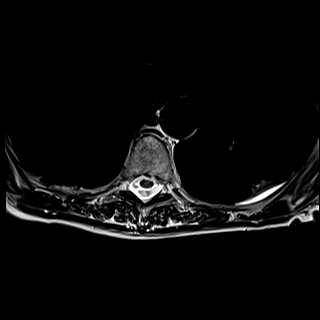
[im 37/44]
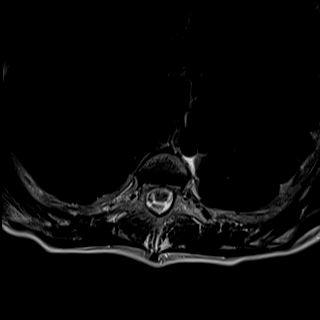
[im 44/44]
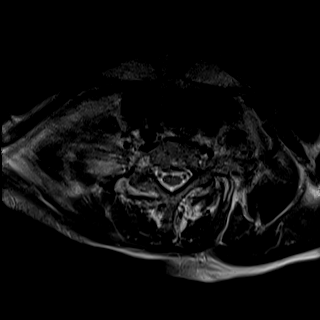

[27 of 48 positions shown; findings below may reference images not displayed]

FINDINGS: Alignment: There is exaggeration of the normal thoracic kyphosis. No
listhesis.

Vertebrae: Biconcave compression fracture of T11 is identified with
vertebral body height loss of up to 90% centrally. There is marrow
edema within the vertebral body consistent with acute or subacute
injury. Marrow edema extends into the anterior margin of the
pedicles but no pedicle fracture is identified. T11 facets, lamina
and spinous process appear normal. There is no other acute fracture.
The patient does have a remote superior endplate compression
fracture T3 vertebral body height loss approximately 90%. Schmorl's
node versus remote mild superior endplate compression fracture T10
is also noted.

Cord:  Normal signal throughout.

Paraspinal and other soft tissues: Pulmonary interstitium appears
coarsened and lungs appear emphysematous.

Disc levels:

T3-4: Tiny left paracentral protrusion without stenosis. No bony
retropulsion from patient's fracture.

T6-7: Shallow left paracentral protrusion without stenosis.

T7-8: Shallow central protrusion without stenosis.

T8-9: Broad-based central protrusion narrows the ventral thecal sac
no stenosis is present.

T10-11: Mild bony retropulsion off the superior endplate T11 but no
stenosis.

T11-12: Minimal bony retropulsion off the inferior endplate of T11
without stenosis.

T12-L1: Disc bulge without stenosis.

L1-2: Disc bulge to the left causes mild narrowing in the left
lateral recess. No nerve root compression.

Except as described, intervertebral disc spaces are unremarkable.
IMPRESSION: Acute or subacute biconcave compression fracture T11 with vertebral
body loss of up to 90% centrally has an appearance most consistent
with a senile osteoporotic injury. There is minimal retropulsion off
the superior and inferior endplates T11 but no resulting stenosis.

Remote T3 compression fracture. Remote T10 compression fracture
versus Schmorl's node.

Scattered degenerative disc disease as detailed above without
stenosis.

Emphysema.

## 2020-04-02 ENCOUNTER — Other Ambulatory Visit: Payer: Self-pay | Admitting: Family Medicine

## 2020-04-02 DIAGNOSIS — R601 Generalized edema: Secondary | ICD-10-CM

## 2020-04-02 NOTE — Telephone Encounter (Signed)
Requested Prescriptions  Pending Prescriptions Disp Refills  . furosemide (LASIX) 20 MG tablet [Pharmacy Med Name: FUROSEMIDE 20 MG TABLET] 90 tablet 1    Sig: TAKE 1 TABLET BY MOUTH EVERY DAY *PATIENT NEEDS OFFICE VISIT*     Cardiovascular:  Diuretics - Loop Failed - 04/02/2020  1:32 AM      Failed - Ca in normal range and within 360 days    Calcium  Date Value Ref Range Status  05/21/2019 10.7 (H) 8.7 - 10.3 mg/dL Final         Passed - K in normal range and within 360 days    Potassium  Date Value Ref Range Status  05/21/2019 4.4 3.5 - 5.2 mmol/L Final         Passed - Na in normal range and within 360 days    Sodium  Date Value Ref Range Status  05/21/2019 142 134 - 144 mmol/L Final         Passed - Cr in normal range and within 360 days    Creatinine, Ser  Date Value Ref Range Status  05/21/2019 0.82 0.57 - 1.00 mg/dL Final         Passed - Last BP in normal range    BP Readings from Last 1 Encounters:  10/24/19 118/76         Passed - Valid encounter within last 6 months    Recent Outpatient Visits          5 months ago Centrilobular emphysema ([place])   [facility] Juline Patch, MD   10 months ago Pain of upper abdomen   [facility] Patch, [facility]   1 year ago Weight loss   [facility] Juline Patch, MD   2 years ago Degenerative disc disease, lumbar   [facility] Juline Patch, MD   2 years ago Centrilobular emphysema [facility])   [place], Deanna C, MD      Future Appointments            In 2 weeks Juline Patch, MD [facility], [facility]

## 2020-04-15 ENCOUNTER — Other Ambulatory Visit: Payer: Self-pay | Admitting: Family Medicine

## 2020-04-15 DIAGNOSIS — J432 Centrilobular emphysema: Secondary | ICD-10-CM

## 2020-04-15 NOTE — Telephone Encounter (Signed)
Requested Prescriptions  Pending Prescriptions Disp Refills  . PROAIR HFA 108 (90 Base) MCG/ACT inhaler [Pharmacy Med Name: PROAIR HFA 90 MCG INHALER] 6.7 g 1    Sig: INHALE 1-2 PUFFS INTO THE LUNGS EVERY 6 (SIX) HOURS AS NEEDED.     Pulmonology:  Beta Agonists Failed - 04/15/2020  2:02 AM      Failed - One inhaler should last at least one month. If the patient is requesting refills earlier, contact the patient to check for uncontrolled symptoms.      Passed - Valid encounter within last 12 months    Recent Outpatient Visits          5 months ago Centrilobular emphysema ([place])   [facility] Jul[facility] Patch, MD   11 months ago Pain of upper abdomen   [facility] Juline Patch, MD   1 year ago Weight loss   [facility] Juline Patch, MD   2 years ago Degenerative disc disease, lumbar   [facility] Jul[facility] Patch, MD   2 years ago Centrilobular emphysema [facility])   [facility] Juline Patch, MD      Future Appointments            In 1 week Juline Patch, MD [facility], [facility]

## 2020-04-22 ENCOUNTER — Encounter: Payer: Self-pay | Admitting: Family Medicine

## 2020-04-22 ENCOUNTER — Other Ambulatory Visit: Payer: Self-pay

## 2020-04-22 ENCOUNTER — Ambulatory Visit (INDEPENDENT_AMBULATORY_CARE_PROVIDER_SITE_OTHER): Payer: PPO | Admitting: Family Medicine

## 2020-04-22 DIAGNOSIS — J432 Centrilobular emphysema: Secondary | ICD-10-CM | POA: Diagnosis not present

## 2020-04-22 MED ORDER — BREO ELLIPTA 100-25 MCG/INH IN AEPB: 1.0000 | INHALATION_SPRAY | Freq: Every day | RESPIRATORY_TRACT | 1 refills | Status: DC

## 2020-04-22 MED ORDER — PROAIR HFA 108 (90 BASE) MCG/ACT IN AERS: 1.0000 | INHALATION_SPRAY | Freq: Four times a day (QID) | RESPIRATORY_TRACT | 1 refills | Status: DC | PRN

## 2020-04-22 MED ORDER — IPRATROPIUM-ALBUTEROL 0.5-2.5 (3) MG/3ML IN SOLN: 3.0000 mL | Freq: Four times a day (QID) | RESPIRATORY_TRACT | 1 refills | Status: DC | PRN

## 2020-04-22 MED ORDER — COMBIVENT RESPIMAT 20-100 MCG/ACT IN AERS: INHALATION_SPRAY | RESPIRATORY_TRACT | 1 refills | Status: DC

## 2020-04-22 NOTE — Progress Notes (Signed)
Date:  04/22/2020   Name:  Gabriella Wiggins   DOB:  1941-03-16   MRN:  [id number]   Chief Complaint: COPD  COPD She complains of shortness of breath and wheezing. There is no chest tightness, cough, difficulty breathing, frequent throat clearing, hemoptysis, hoarse voice or sputum production. This is a chronic problem. The current episode started more than 1 year ago. The problem occurs daily. The problem has been gradually improving. Pertinent negatives include no appetite change, chest pain, dyspnea on exertion, ear congestion, ear pain, fever, headaches, heartburn, malaise/fatigue, myalgias, nasal congestion, orthopnea, PND, postnasal drip, rhinorrhea, sneezing, sore throat, sweats, trouble swallowing or weight loss. Her symptoms are aggravated by change in weather and minimal activity. Her symptoms are alleviated by beta-agonist, ipratropium and steroid inhaler. She reports moderate improvement on treatment. Her past medical history is significant for COPD. There is no history of asthma, bronchiectasis, bronchitis, emphysema or pneumonia.    Lab Results  Component Value Date   CREATININE 0.82 05/21/2019   BUN 14 05/21/2019   NA 142 05/21/2019   K 4.4 05/21/2019   CL 95 (L) 05/21/2019   CO2 30 (H) 05/21/2019   Lab Results  Component Value Date   CHOL 150 09/01/2018   HDL 21 (L) 09/01/2018   LDLCALC 111 (H) 09/01/2018   TRIG 91 09/01/2018   CHOLHDL 7.1 09/01/2018   Lab Results  Component Value Date   TSH 1.570 06/06/2018   No results found for: HGBA1C Lab Results  Component Value Date   WBC 6.2 05/21/2019   HGB 12.8 05/21/2019   HCT 38.2 05/21/2019   MCV 95 05/21/2019   PLT 290 05/21/2019   Lab Results  Component Value Date   ALT 11 05/21/2019   AST 25 05/21/2019   ALKPHOS 158 (H) 05/21/2019   BILITOT 0.5 05/21/2019     Review of Systems  Constitutional: Negative.  Negative for appetite change, chills, fatigue, fever, malaise/fatigue, unexpected weight change and  weight loss.  HENT: Negative for congestion, ear discharge, ear pain, hoarse voice, postnasal drip, rhinorrhea, sinus pressure, sneezing, sore throat and trouble swallowing.   Eyes: Negative for photophobia, pain, discharge, redness and itching.  Respiratory: Positive for shortness of breath and wheezing. Negative for cough, hemoptysis, sputum production and stridor.   Cardiovascular: Negative for chest pain, dyspnea on exertion and PND.  Gastrointestinal: Negative for abdominal pain, blood in stool, constipation, diarrhea, heartburn, nausea and vomiting.  Endocrine: Negative for cold intolerance, heat intolerance, polydipsia, polyphagia and polyuria.  Genitourinary: Negative for dysuria, flank pain, frequency, hematuria, menstrual problem, pelvic pain, urgency, vaginal bleeding and vaginal discharge.  Musculoskeletal: Negative for arthralgias, back pain and myalgias.  Skin: Negative for rash.  Allergic/Immunologic: Negative for environmental allergies and food allergies.  Neurological: Negative for dizziness, weakness, light-headedness, numbness and headaches.  Hematological: Negative for adenopathy. Does not bruise/bleed easily.  Psychiatric/Behavioral: Negative for dysphoric mood. The patient is not nervous/anxious.     Patient Active Problem List   Diagnosis Date Noted   Generalized edema 10/24/2019   Atherosclerosis of native coronary artery of native heart with angina pectoris ([place]) 05/21/2019   Protein-calorie malnutrition, severe 09/02/2018   Acute respiratory failure with hypoxia ([place]) 08/31/2018   Atherosclerosis of aorta ([place]) 06/06/2018   PMB (postmenopausal bleeding) 06/27/2017   Genetic testing 06/08/2017   Centrilobular emphysema ([place]) 09/09/2016   Hematuria, gross 07/24/2015    Allergies  Allergen Reactions   Penicillin G Other (See Comments)   Penicillins Hives  Has patient had a PCN reaction causing immediate rash, facial/tongue/throat swelling, SOB or  lightheadedness with hypotension: No Has patient had a PCN reaction causing severe rash involving mucus membranes or skin necrosis: No Has patient had a PCN reaction that required hospitalization: No Has patient had a PCN reaction occurring within the last 10 years: No If all of the above answers are "NO", then may proceed with Cephalosporin use.    Shrimp [Shellfish Allergy] Swelling    Past Surgical History:  Procedure Laterality Date   APPENDECTOMY     bilateral tubal     BREAST BIOPSY Right    milk duct removed -neg   BREAST BIOPSY Left 2004   lumpectomy/rad   BREAST LUMPECTOMY Left    DILATATION & CURETTAGE/HYSTEROSCOPY WITH MYOSURE N/A 06/20/2017   Procedure: DILATATION & CURETTAGE/HYSTEROSCOPY WITH MYOSURE;  Surgeon: Schermerhorn, Gwen Her, MD;  Location: [facility] OR[facility];  Service: Gynecology;  Laterality: N/A;   HYSTEROSCOPY WITH D & C N/A 06/20/2017   Procedure: DILATATION AND CURETTAGE /HYSTEROSCOPY;  Surgeon: Schermerhorn, Gwen Her, MD;  Location: [facility] OR[facility];  Service: Gynecology;  Laterality: N/A;   KYPHOPLASTY N/A 05/29/2019   Procedure: T11 KYPHOPLASTY;  Surgeon: Hessie Knows, MD;  Location: [facility] OR[facility];  Service: Orthopedics;  Laterality: N/A;   OVARIAN CYST SURGERY      Social History   Tobacco Use   Smoking status: Former Smoker    Packs/day: 1.00    Years: 55.00    Pack years: 55.00    Types: Cigarettes    Quit date: 08/27/2018    Years since quitting: 1.6   Smokeless tobacco: Never Used  Vaping Use   Vaping Use: Never used  Substance Use Topics   Alcohol use: No    Alcohol/week: 0.0 standard drinks   Drug use: No     Medication list has been reviewed and updated.  Current Meds  Medication Sig   acetaminophen (TYLENOL) 325 MG tablet Take 1 tablet (325 mg total) by mouth every 6 (six) hours as needed for mild pain (or Fever >/= 101).   aspirin EC 81 MG tablet Take 81 mg by mouth every other day.    BREO ELLIPTA 100-25 MCG/INH AEPB Inhale 1 puff  into the lungs daily.   carboxymethylcellulose (REFRESH PLUS) 0.5 % SOLN Place 1 drop into both eyes 2 (two) times daily as needed (dry eyes).   cholecalciferol (VITAMIN D3) 25 MCG (1000 UT) tablet Take 1,000 Units by mouth daily.   COMBIVENT RESPIMAT 20-100 MCG/ACT AERS respimat INHALE 1 PUFF INTO THE LUNGS 2 (TWO) TIMES DAILY AS NEEDED FOR WHEEZING OR SHORTNESS OF BREATH.   furosemide (LASIX) 20 MG tablet TAKE 1 TABLET BY MOUTH EVERY DAY *PATIENT NEEDS OFFICE VISIT*   ipratropium-albuterol (DUONEB) 0.5-2.5 (3) MG/3ML SOLN Take 3 mLs by nebulization every 6 (six) hours as needed.   PROAIR HFA 108 (90 Base) MCG/ACT inhaler INHALE 1-2 PUFFS INTO THE LUNGS EVERY 6 (SIX) HOURS AS NEEDED.   pyridOXINE (VITAMIN B-6) 100 MG tablet Take 200 mg by mouth daily.     PHQ 2/9 Scores 04/22/2020 02/18/2020 10/24/2019 09/01/2017  PHQ - 2 Score 0 0 0 0  PHQ- 9 Score 0 - 0 0    GAD 7 : Generalized Anxiety Score 04/22/2020 10/24/2019  Nervous, Anxious, on Edge 2 0  Control/stop worrying 0 0  Worry too much - different things 0 0  Trouble relaxing 0 0  Restless 0 0  Easily annoyed or irritable 0 0  Afraid - awful  might happen 0 0  Total GAD 7 Score 2 0  Anxiety Difficulty Not difficult at all -    BP Readings from Last 3 Encounters:  04/22/20 (!) 130/80  10/24/19 118/76  05/29/19 (!) 162/79    Physical Exam Vitals and nursing note reviewed.  Constitutional:      General: She is not in acute distress.    Appearance: She is not diaphoretic.  HENT:     Head: Normocephalic and atraumatic.     Right Ear: Ear canal and external ear normal.     Left Ear: Tympanic membrane, ear canal and external ear normal.     Nose: Nose normal. No congestion or rhinorrhea.     Mouth/Throat:     Mouth: Mucous membranes are moist.     Pharynx: Oropharynx is clear. No oropharyngeal exudate.  Eyes:     General:        Right eye: No discharge.        Left eye: No discharge.     Conjunctiva/sclera: Conjunctivae  normal.     Pupils: Pupils are equal, round, and reactive to light.  Neck:     Thyroid: No thyromegaly.     Vascular: No JVD.  Cardiovascular:     Rate and Rhythm: Normal rate and regular rhythm.     Pulses: Normal pulses.          Carotid pulses are 2+ on the right side and 2+ on the left side.      Radial pulses are 2+ on the right side and 2+ on the left side.       Femoral pulses are 2+ on the right side and 2+ on the left side.      Popliteal pulses are 2+ on the right side and 2+ on the left side.       Dorsalis pedis pulses are 2+ on the right side and 2+ on the left side.       Posterior tibial pulses are 2+ on the right side and 2+ on the left side.     Heart sounds: Normal heart sounds, S1 normal and S2 normal. No murmur heard.  No friction rub. No gallop.   Pulmonary:     Effort: Pulmonary effort is normal.     Breath sounds: Normal breath sounds. Decreased air movement present. No decreased breath sounds, wheezing, rhonchi or rales.  Abdominal:     General: Bowel sounds are normal.     Palpations: Abdomen is soft. There is no mass.     Tenderness: There is no abdominal tenderness. There is no guarding or rebound.  Musculoskeletal:        General: Normal range of motion.     Cervical back: Normal range of motion and neck supple.     Right lower leg: 1+ Pitting Edema present.     Left lower leg: 1+ Pitting Edema present.  Lymphadenopathy:     Cervical: No cervical adenopathy.  Skin:    General: Skin is warm and dry.  Neurological:     Mental Status: She is alert.     Deep Tendon Reflexes: Reflexes are normal and symmetric.     Wt Readings from Last 3 Encounters:  04/22/20 (!) 94 lb (42.6 kg)  10/24/19 94 lb (42.6 kg)  05/29/19 98 lb (44.5 kg)    BP (!) 130/80    Pulse 80    Ht 5\' 2"  (1.575 m)    Wt (!) 94 lb (42.6 kg)  BMI 17.19 kg/m   Assessment and Plan: 1. Centrilobular emphysema ([place]) Relatively controlled.  Stable.  Complicated.  With pulmonary  function noted to be in the 17% range per patient.  Patient will continue Combivent 1 puff 4-5 times a day, ProAir 1 to 2 puffs every 6 hours, Breo, and DuoNeb as needed. - Ipratropium-Albuterol (COMBIVENT RESPIMAT) 20-100 MCG/ACT AERS respimat; 1 puff 4 times a day  Dispense: 4 g; Refill: 1 - PROAIR HFA 108 (90 Base) MCG/ACT inhaler; Inhale 1-2 puffs into the lungs every 6 (six) hours as needed.  Dispense: 6.7 g; Refill: 1

## 2020-06-18 DIAGNOSIS — J449 Chronic obstructive pulmonary disease, unspecified: Secondary | ICD-10-CM | POA: Diagnosis not present

## 2020-06-18 DIAGNOSIS — R0600 Dyspnea, unspecified: Secondary | ICD-10-CM | POA: Diagnosis not present

## 2020-06-18 DIAGNOSIS — Z9981 Dependence on supplemental oxygen: Secondary | ICD-10-CM | POA: Diagnosis not present

## 2020-06-28 ENCOUNTER — Other Ambulatory Visit: Payer: Self-pay | Admitting: Family Medicine

## 2020-08-05 ENCOUNTER — Other Ambulatory Visit: Payer: Self-pay | Admitting: Family Medicine

## 2020-08-05 DIAGNOSIS — J432 Centrilobular emphysema: Secondary | ICD-10-CM

## 2020-08-05 NOTE — Telephone Encounter (Signed)
Requested Prescriptions  Pending Prescriptions Disp Refills  . PROAIR HFA 108 (90 Base) MCG/ACT inhaler [Pharmacy Med Name: PROAIR HFA 90 MCG INHALER] 8.5 each 1    Sig: INHALE 1-2 PUFFS INTO THE LUNGS EVERY 6 (SIX) HOURS AS NEEDED.     Pulmonology:  Beta Agonists Failed - 08/05/2020  1:35 AM      Failed - One inhaler should last at least one month. If the patient is requesting refills earlier, contact the patient to check for uncontrolled symptoms.      Passed - Valid encounter within last 12 months    Recent Outpatient Visits          3 months ago Centrilobular emphysema ([place])   [facility] Juline Patch, MD   9 months ago Centrilobular emphysema [facility])   [facility] Juline Patch, MD   1 year ago Pain of upper abdomen   [facility], [facility]   2 years ago Weight loss   [facility] Juline Patch, MD   2 years ago Degenerative disc disease, lumbar   [facility] Juline Patch, MD

## 2020-10-07 ENCOUNTER — Other Ambulatory Visit: Payer: Self-pay | Admitting: Family Medicine

## 2020-10-07 DIAGNOSIS — J432 Centrilobular emphysema: Secondary | ICD-10-CM

## 2020-10-08 ENCOUNTER — Other Ambulatory Visit: Payer: Self-pay | Admitting: Family Medicine

## 2020-10-08 DIAGNOSIS — J432 Centrilobular emphysema: Secondary | ICD-10-CM

## 2020-11-08 ENCOUNTER — Other Ambulatory Visit: Payer: Self-pay | Admitting: Family Medicine

## 2020-11-08 DIAGNOSIS — J432 Centrilobular emphysema: Secondary | ICD-10-CM

## 2020-12-02 ENCOUNTER — Other Ambulatory Visit: Payer: Self-pay | Admitting: Family Medicine

## 2020-12-02 DIAGNOSIS — J432 Centrilobular emphysema: Secondary | ICD-10-CM

## 2020-12-09 DIAGNOSIS — Z9981 Dependence on supplemental oxygen: Secondary | ICD-10-CM | POA: Diagnosis not present

## 2020-12-09 DIAGNOSIS — J439 Emphysema, unspecified: Secondary | ICD-10-CM | POA: Diagnosis not present

## 2020-12-09 DIAGNOSIS — R0600 Dyspnea, unspecified: Secondary | ICD-10-CM | POA: Diagnosis not present

## 2021-01-18 ENCOUNTER — Other Ambulatory Visit: Payer: Self-pay | Admitting: Family Medicine

## 2021-01-18 DIAGNOSIS — J432 Centrilobular emphysema: Secondary | ICD-10-CM

## 2021-01-18 NOTE — Telephone Encounter (Signed)
Requested Prescriptions  Pending Prescriptions Disp Refills  . PROAIR HFA 108 (90 Base) MCG/ACT inhaler [Pharmacy Med Name: PROAIR HFA 90 MCG INHALER] 8.5 each 1    Sig: INHALE 1-2 PUFFS INTO THE LUNGS EVERY 6 HOURS AS NEEDED.     Pulmonology:  Beta Agonists Failed - 01/18/2021 12:50 AM      Failed - One inhaler should last at least one month. If the patient is requesting refills earlier, contact the patient to check for uncontrolled symptoms.      Passed - Valid encounter within last 12 months    Recent Outpatient Visits          9 months ago Centrilobular emphysema ([place])   [facility] Juline Patch, MD   1 year ago Centrilobular emphysema [facility])   [facility] Juline Patch, MD   1 year ago Pain of upper abdomen   [facility] Jul[facility], [facility]   2 years ago Weight loss   [facility], [facility]   3 years ago Degenerative disc disease, lumbar   [facility] Juline Patch, MD

## 2021-02-16 DIAGNOSIS — H524 Presbyopia: Secondary | ICD-10-CM | POA: Diagnosis not present

## 2021-02-19 ENCOUNTER — Telehealth: Payer: Self-pay | Admitting: Family Medicine

## 2021-02-19 NOTE — Telephone Encounter (Signed)
Patient declined the Medicare Wellness Visit with [facility].  Did not give a reason other than she does not feel she needs to complete.

## 2021-03-07 ENCOUNTER — Other Ambulatory Visit: Payer: Self-pay | Admitting: Family Medicine

## 2021-03-07 DIAGNOSIS — J432 Centrilobular emphysema: Secondary | ICD-10-CM

## 2021-03-11 DIAGNOSIS — J449 Chronic obstructive pulmonary disease, unspecified: Secondary | ICD-10-CM | POA: Diagnosis not present

## 2021-03-11 DIAGNOSIS — Z9981 Dependence on supplemental oxygen: Secondary | ICD-10-CM | POA: Diagnosis not present

## 2021-03-11 DIAGNOSIS — R0600 Dyspnea, unspecified: Secondary | ICD-10-CM | POA: Diagnosis not present

## 2021-04-23 ENCOUNTER — Other Ambulatory Visit: Payer: Self-pay | Admitting: Family Medicine

## 2021-04-23 DIAGNOSIS — J432 Centrilobular emphysema: Secondary | ICD-10-CM

## 2021-04-23 NOTE — Telephone Encounter (Signed)
Requested medications are due for refill today.  yes  Requested medications are on the active medications list.  yes  Last refill. 03/07/2021  Future visit scheduled.   no  Notes to clinic.  Refill needs overrides to fill. Pt last in office 04/22/2020.

## 2021-04-27 ENCOUNTER — Other Ambulatory Visit: Payer: Self-pay | Admitting: Family Medicine

## 2021-04-27 DIAGNOSIS — J432 Centrilobular emphysema: Secondary | ICD-10-CM

## 2021-04-28 NOTE — Telephone Encounter (Signed)
Requested medication (s) are due for refill today: Yes  Requested medication (s) are on the active medication list: Yes  Last refill:  03/07/21  Future visit scheduled: No  Notes to clinic:  Unable to refill per protocol, appointment needed. See notes attached.      Requested Prescriptions  Pending Prescriptions Disp Refills   PROAIR HFA 108 (90 Base) MCG/ACT inhaler [Pharmacy Med Name: PROAIR HFA 90 MCG INHALER] 8.5 each 1    Sig: INHALE 1-2 PUFFS INTO THE LUNGS EVERY 6 HOURS AS NEEDED.      Pulmonology:  Beta Agonists Failed - 04/27/2021  6:14 PM      Failed - One inhaler should last at least one month. If the patient is requesting refills earlier, contact the patient to check for uncontrolled symptoms.      Failed - Valid encounter within last 12 months    Recent Outpatient Visits           1 year ago Centrilobular emphysema ([place])   [facility] Juline Patch, MD   1 year ago Centrilobular emphysema [facility])   [facility] Juline Patch, MD   1 year ago Pain of upper abdomen   [facility] Juline Patch, MD   2 years ago Weight loss   [facility] Juline Patch, MD   3 years ago Degenerative disc disease, lumbar   [facility] Juline Patch, MD

## 2021-04-28 NOTE — Telephone Encounter (Signed)
Patient called and advised she will need an appointment in order to refill medications, last OV 04/02/20. She says she has some things/other appointments that need to be taken care of before she can schedule with Dr. Ronnald Ramp. I advised her to call the pulmonary doctor to refill this inhaler as it may get refused by the provider due to no appointment in the last year. She says she will call and let the lung doctor know.

## 2021-05-07 ENCOUNTER — Other Ambulatory Visit: Payer: Self-pay | Admitting: Family Medicine

## 2021-05-07 DIAGNOSIS — J432 Centrilobular emphysema: Secondary | ICD-10-CM

## 2021-05-07 NOTE — Telephone Encounter (Signed)
Requested medication (s) are due for refill today - yes  Requested medication (s) are on the active medication list -yes  Future visit scheduled -no  Last refill: 03/29/21  Notes to clinic: Attempted to call patient to schedule appointment- left message to call office for appointment. Rx has been refused several times- notes: needs appointment, alternate provider. Rx request sent for PCP review   Requested Prescriptions  Pending Prescriptions Disp Refills   PROAIR HFA 108 (90 Base) MCG/ACT inhaler [Pharmacy Med Name: PROAIR HFA 90 MCG INHALER] 8.5 each 1    Sig: INHALE 1-2 PUFFS INTO THE LUNGS EVERY 6 HOURS AS NEEDED.     Pulmonology:  Beta Agonists Failed - 05/07/2021 12:12 PM      Failed - One inhaler should last at least one month. If the patient is requesting refills earlier, contact the patient to check for uncontrolled symptoms.      Failed - Valid encounter within last 12 months    Recent Outpatient Visits           1 year ago Centrilobular emphysema ([place])   [facility] Juline Patch, MD   1 year ago Centrilobular emphysema [facility])   [facility] Juline Patch, MD   1 year ago Pain of upper abdomen   [facility] Juline Patch, [facility]   2 years ago Weight loss   [facility] Juline Patch, MD   3 years ago Degenerative disc disease, lumbar   [place], MD                 Requested Prescriptions  Pending Prescriptions Disp Refills   PROAIR HFA 108 (90 Base) MCG/ACT inhaler [Pharmacy Med Name: PROAIR HFA 90 MCG INHALER] 8.5 each 1    Sig: INHALE 1-2 PUFFS INTO THE LUNGS EVERY 6 HOURS AS NEEDED.     Pulmonology:  Beta Agonists Failed - 05/07/2021 12:12 PM      Failed - One inhaler should last at least one month. If the patient is requesting refills earlier, contact the patient to check for uncontrolled symptoms.      Failed - Valid encounter within last 12 months    Recent Outpatient Visits            1 year ago Centrilobular emphysema ([place])   [facility] Juline Patch, MD   1 year ago Centrilobular emphysema [facility])   [facility] Juline Patch, MD   1 year ago Pain of upper abdomen   [facility] Juline Patch, [facility]   2 years ago Weight loss   [facility] Juline Patch, MD   3 years ago Degenerative disc disease, lumbar   [facility] Juline Patch, MD

## 2021-07-21 DIAGNOSIS — Z9981 Dependence on supplemental oxygen: Secondary | ICD-10-CM | POA: Diagnosis not present

## 2021-07-21 DIAGNOSIS — R0609 Other forms of dyspnea: Secondary | ICD-10-CM | POA: Diagnosis not present

## 2021-07-21 DIAGNOSIS — J449 Chronic obstructive pulmonary disease, unspecified: Secondary | ICD-10-CM | POA: Diagnosis not present

## 2021-11-05 DIAGNOSIS — J449 Chronic obstructive pulmonary disease, unspecified: Secondary | ICD-10-CM | POA: Diagnosis not present

## 2021-11-05 DIAGNOSIS — Z9981 Dependence on supplemental oxygen: Secondary | ICD-10-CM | POA: Diagnosis not present

## 2021-11-05 DIAGNOSIS — R0609 Other forms of dyspnea: Secondary | ICD-10-CM | POA: Diagnosis not present

## 2022-03-27 DIAGNOSIS — I6782 Cerebral ischemia: Secondary | ICD-10-CM | POA: Diagnosis not present

## 2022-03-27 DIAGNOSIS — R29723 NIHSS score 23: Secondary | ICD-10-CM | POA: Diagnosis not present

## 2022-03-27 DIAGNOSIS — Z452 Encounter for adjustment and management of vascular access device: Secondary | ICD-10-CM | POA: Diagnosis not present

## 2022-03-27 DIAGNOSIS — K5900 Constipation, unspecified: Secondary | ICD-10-CM | POA: Diagnosis not present

## 2022-03-27 DIAGNOSIS — Z681 Body mass index (BMI) 19 or less, adult: Secondary | ICD-10-CM | POA: Diagnosis not present

## 2022-03-27 DIAGNOSIS — R404 Transient alteration of awareness: Secondary | ICD-10-CM | POA: Diagnosis not present

## 2022-03-27 DIAGNOSIS — I69398 Other sequelae of cerebral infarction: Secondary | ICD-10-CM | POA: Diagnosis not present

## 2022-03-27 DIAGNOSIS — R531 Weakness: Secondary | ICD-10-CM | POA: Diagnosis not present

## 2022-03-27 DIAGNOSIS — R29704 NIHSS score 4: Secondary | ICD-10-CM | POA: Diagnosis not present

## 2022-03-27 DIAGNOSIS — I2510 Atherosclerotic heart disease of native coronary artery without angina pectoris: Secondary | ICD-10-CM | POA: Diagnosis not present

## 2022-03-27 DIAGNOSIS — Z4682 Encounter for fitting and adjustment of non-vascular catheter: Secondary | ICD-10-CM | POA: Diagnosis not present

## 2022-03-27 DIAGNOSIS — I69954 Hemiplegia and hemiparesis following unspecified cerebrovascular disease affecting left non-dominant side: Secondary | ICD-10-CM | POA: Diagnosis not present

## 2022-03-27 DIAGNOSIS — R9089 Other abnormal findings on diagnostic imaging of central nervous system: Secondary | ICD-10-CM | POA: Diagnosis not present

## 2022-03-27 DIAGNOSIS — J449 Chronic obstructive pulmonary disease, unspecified: Secondary | ICD-10-CM | POA: Diagnosis not present

## 2022-03-27 DIAGNOSIS — Z66 Do not resuscitate: Secondary | ICD-10-CM | POA: Diagnosis not present

## 2022-03-27 DIAGNOSIS — J9690 Respiratory failure, unspecified, unspecified whether with hypoxia or hypercapnia: Secondary | ICD-10-CM | POA: Diagnosis not present

## 2022-03-27 DIAGNOSIS — J439 Emphysema, unspecified: Secondary | ICD-10-CM | POA: Diagnosis not present

## 2022-03-27 DIAGNOSIS — I639 Cerebral infarction, unspecified: Secondary | ICD-10-CM | POA: Diagnosis not present

## 2022-03-27 DIAGNOSIS — R4182 Altered mental status, unspecified: Secondary | ICD-10-CM | POA: Diagnosis not present

## 2022-03-27 DIAGNOSIS — J9621 Acute and chronic respiratory failure with hypoxia: Secondary | ICD-10-CM | POA: Diagnosis not present

## 2022-03-27 DIAGNOSIS — Z7982 Long term (current) use of aspirin: Secondary | ICD-10-CM | POA: Diagnosis not present

## 2022-03-27 DIAGNOSIS — Z9981 Dependence on supplemental oxygen: Secondary | ICD-10-CM | POA: Diagnosis not present

## 2022-03-27 DIAGNOSIS — I499 Cardiac arrhythmia, unspecified: Secondary | ICD-10-CM | POA: Diagnosis not present

## 2022-03-27 DIAGNOSIS — I959 Hypotension, unspecified: Secondary | ICD-10-CM | POA: Diagnosis not present

## 2022-03-27 DIAGNOSIS — R6889 Other general symptoms and signs: Secondary | ICD-10-CM | POA: Diagnosis not present

## 2022-03-27 DIAGNOSIS — E46 Unspecified protein-calorie malnutrition: Secondary | ICD-10-CM | POA: Diagnosis not present

## 2022-03-27 DIAGNOSIS — I248 Other forms of acute ischemic heart disease: Secondary | ICD-10-CM | POA: Diagnosis not present

## 2022-03-27 DIAGNOSIS — R569 Unspecified convulsions: Secondary | ICD-10-CM | POA: Diagnosis not present

## 2022-03-27 DIAGNOSIS — Z87891 Personal history of nicotine dependence: Secondary | ICD-10-CM | POA: Diagnosis not present

## 2022-03-27 DIAGNOSIS — Z853 Personal history of malignant neoplasm of breast: Secondary | ICD-10-CM | POA: Diagnosis not present

## 2022-03-27 DIAGNOSIS — G319 Degenerative disease of nervous system, unspecified: Secondary | ICD-10-CM | POA: Diagnosis not present

## 2022-03-27 DIAGNOSIS — I5189 Other ill-defined heart diseases: Secondary | ICD-10-CM | POA: Diagnosis not present

## 2022-03-27 DIAGNOSIS — I700 Atherosclerosis of aorta: Secondary | ICD-10-CM | POA: Diagnosis not present

## 2022-03-27 DIAGNOSIS — I69314 Frontal lobe and executive function deficit following cerebral infarction: Secondary | ICD-10-CM | POA: Diagnosis not present

## 2022-03-27 DIAGNOSIS — I480 Paroxysmal atrial fibrillation: Secondary | ICD-10-CM | POA: Diagnosis not present

## 2022-03-27 DIAGNOSIS — G40909 Epilepsy, unspecified, not intractable, without status epilepticus: Secondary | ICD-10-CM | POA: Diagnosis not present

## 2022-03-27 DIAGNOSIS — R9401 Abnormal electroencephalogram [EEG]: Secondary | ICD-10-CM | POA: Diagnosis not present

## 2022-03-27 DIAGNOSIS — I4891 Unspecified atrial fibrillation: Secondary | ICD-10-CM | POA: Diagnosis not present

## 2022-03-27 DIAGNOSIS — G9340 Encephalopathy, unspecified: Secondary | ICD-10-CM | POA: Diagnosis not present

## 2022-03-27 DIAGNOSIS — M1990 Unspecified osteoarthritis, unspecified site: Secondary | ICD-10-CM | POA: Diagnosis not present

## 2022-03-27 DIAGNOSIS — I10 Essential (primary) hypertension: Secondary | ICD-10-CM | POA: Diagnosis not present

## 2022-03-27 DIAGNOSIS — E43 Unspecified severe protein-calorie malnutrition: Secondary | ICD-10-CM | POA: Diagnosis not present

## 2022-03-27 DIAGNOSIS — R410 Disorientation, unspecified: Secondary | ICD-10-CM | POA: Diagnosis not present

## 2022-03-27 DIAGNOSIS — E876 Hypokalemia: Secondary | ICD-10-CM | POA: Diagnosis not present

## 2022-03-27 DIAGNOSIS — J9601 Acute respiratory failure with hypoxia: Secondary | ICD-10-CM | POA: Diagnosis not present

## 2022-03-27 DIAGNOSIS — N179 Acute kidney failure, unspecified: Secondary | ICD-10-CM | POA: Diagnosis not present

## 2022-03-27 DIAGNOSIS — R471 Dysarthria and anarthria: Secondary | ICD-10-CM | POA: Diagnosis not present

## 2022-03-27 DIAGNOSIS — R29722 NIHSS score 22: Secondary | ICD-10-CM | POA: Diagnosis not present

## 2022-03-27 DIAGNOSIS — Z9911 Dependence on respirator [ventilator] status: Secondary | ICD-10-CM | POA: Diagnosis not present

## 2022-03-27 DIAGNOSIS — R29718 NIHSS score 18: Secondary | ICD-10-CM | POA: Diagnosis not present

## 2022-03-27 DIAGNOSIS — G8194 Hemiplegia, unspecified affecting left nondominant side: Secondary | ICD-10-CM | POA: Diagnosis not present

## 2022-03-27 DIAGNOSIS — Z743 Need for continuous supervision: Secondary | ICD-10-CM | POA: Diagnosis not present

## 2022-03-27 DIAGNOSIS — M6281 Muscle weakness (generalized): Secondary | ICD-10-CM | POA: Diagnosis not present

## 2022-03-28 DIAGNOSIS — E46 Unspecified protein-calorie malnutrition: Secondary | ICD-10-CM | POA: Diagnosis not present

## 2022-03-28 DIAGNOSIS — J9690 Respiratory failure, unspecified, unspecified whether with hypoxia or hypercapnia: Secondary | ICD-10-CM | POA: Diagnosis not present

## 2022-03-28 DIAGNOSIS — G8194 Hemiplegia, unspecified affecting left nondominant side: Secondary | ICD-10-CM | POA: Diagnosis not present

## 2022-03-28 DIAGNOSIS — N179 Acute kidney failure, unspecified: Secondary | ICD-10-CM | POA: Diagnosis not present

## 2022-03-28 DIAGNOSIS — R4182 Altered mental status, unspecified: Secondary | ICD-10-CM | POA: Diagnosis not present

## 2022-03-28 DIAGNOSIS — R9401 Abnormal electroencephalogram [EEG]: Secondary | ICD-10-CM | POA: Diagnosis not present

## 2022-03-28 DIAGNOSIS — R569 Unspecified convulsions: Secondary | ICD-10-CM | POA: Diagnosis not present

## 2022-03-28 DIAGNOSIS — Z853 Personal history of malignant neoplasm of breast: Secondary | ICD-10-CM | POA: Diagnosis not present

## 2022-03-28 DIAGNOSIS — J449 Chronic obstructive pulmonary disease, unspecified: Secondary | ICD-10-CM | POA: Diagnosis not present

## 2022-03-28 DIAGNOSIS — R471 Dysarthria and anarthria: Secondary | ICD-10-CM | POA: Diagnosis not present

## 2022-03-28 DIAGNOSIS — I10 Essential (primary) hypertension: Secondary | ICD-10-CM | POA: Diagnosis not present

## 2022-03-28 DIAGNOSIS — Z9911 Dependence on respirator [ventilator] status: Secondary | ICD-10-CM | POA: Diagnosis not present

## 2022-03-28 DIAGNOSIS — I4891 Unspecified atrial fibrillation: Secondary | ICD-10-CM | POA: Diagnosis not present

## 2022-03-28 DIAGNOSIS — I248 Other forms of acute ischemic heart disease: Secondary | ICD-10-CM | POA: Diagnosis not present

## 2022-03-29 DIAGNOSIS — I4891 Unspecified atrial fibrillation: Secondary | ICD-10-CM | POA: Diagnosis not present

## 2022-03-29 DIAGNOSIS — R569 Unspecified convulsions: Secondary | ICD-10-CM | POA: Diagnosis not present

## 2022-03-29 DIAGNOSIS — R471 Dysarthria and anarthria: Secondary | ICD-10-CM | POA: Diagnosis not present

## 2022-03-29 DIAGNOSIS — N179 Acute kidney failure, unspecified: Secondary | ICD-10-CM | POA: Diagnosis not present

## 2022-03-29 DIAGNOSIS — R9401 Abnormal electroencephalogram [EEG]: Secondary | ICD-10-CM | POA: Diagnosis not present

## 2022-03-29 DIAGNOSIS — I10 Essential (primary) hypertension: Secondary | ICD-10-CM | POA: Diagnosis not present

## 2022-03-29 DIAGNOSIS — G8194 Hemiplegia, unspecified affecting left nondominant side: Secondary | ICD-10-CM | POA: Diagnosis not present

## 2022-03-29 DIAGNOSIS — J449 Chronic obstructive pulmonary disease, unspecified: Secondary | ICD-10-CM | POA: Diagnosis not present

## 2022-03-29 DIAGNOSIS — E46 Unspecified protein-calorie malnutrition: Secondary | ICD-10-CM | POA: Diagnosis not present

## 2022-03-29 DIAGNOSIS — E876 Hypokalemia: Secondary | ICD-10-CM | POA: Diagnosis not present

## 2022-03-30 DIAGNOSIS — R9089 Other abnormal findings on diagnostic imaging of central nervous system: Secondary | ICD-10-CM | POA: Diagnosis not present

## 2022-03-30 DIAGNOSIS — E46 Unspecified protein-calorie malnutrition: Secondary | ICD-10-CM | POA: Diagnosis not present

## 2022-03-30 DIAGNOSIS — Z9981 Dependence on supplemental oxygen: Secondary | ICD-10-CM | POA: Diagnosis not present

## 2022-03-30 DIAGNOSIS — I10 Essential (primary) hypertension: Secondary | ICD-10-CM | POA: Diagnosis not present

## 2022-03-30 DIAGNOSIS — I5189 Other ill-defined heart diseases: Secondary | ICD-10-CM | POA: Diagnosis not present

## 2022-03-30 DIAGNOSIS — R569 Unspecified convulsions: Secondary | ICD-10-CM | POA: Diagnosis not present

## 2022-03-30 DIAGNOSIS — J449 Chronic obstructive pulmonary disease, unspecified: Secondary | ICD-10-CM | POA: Diagnosis not present

## 2022-03-30 DIAGNOSIS — I6782 Cerebral ischemia: Secondary | ICD-10-CM | POA: Diagnosis not present

## 2022-03-31 DIAGNOSIS — G8194 Hemiplegia, unspecified affecting left nondominant side: Secondary | ICD-10-CM | POA: Diagnosis not present

## 2022-03-31 DIAGNOSIS — I10 Essential (primary) hypertension: Secondary | ICD-10-CM | POA: Diagnosis not present

## 2022-03-31 DIAGNOSIS — J449 Chronic obstructive pulmonary disease, unspecified: Secondary | ICD-10-CM | POA: Diagnosis not present

## 2022-03-31 DIAGNOSIS — R410 Disorientation, unspecified: Secondary | ICD-10-CM | POA: Diagnosis not present

## 2022-03-31 DIAGNOSIS — E46 Unspecified protein-calorie malnutrition: Secondary | ICD-10-CM | POA: Diagnosis not present

## 2022-03-31 DIAGNOSIS — Z9981 Dependence on supplemental oxygen: Secondary | ICD-10-CM | POA: Diagnosis not present

## 2022-04-01 DIAGNOSIS — E46 Unspecified protein-calorie malnutrition: Secondary | ICD-10-CM | POA: Diagnosis not present

## 2022-04-01 DIAGNOSIS — R569 Unspecified convulsions: Secondary | ICD-10-CM | POA: Diagnosis not present

## 2022-04-01 DIAGNOSIS — Z9981 Dependence on supplemental oxygen: Secondary | ICD-10-CM | POA: Diagnosis not present

## 2022-04-01 DIAGNOSIS — I10 Essential (primary) hypertension: Secondary | ICD-10-CM | POA: Diagnosis not present

## 2022-04-01 DIAGNOSIS — J449 Chronic obstructive pulmonary disease, unspecified: Secondary | ICD-10-CM | POA: Diagnosis not present

## 2022-04-02 DIAGNOSIS — I4821 Permanent atrial fibrillation: Secondary | ICD-10-CM | POA: Diagnosis not present

## 2022-04-02 DIAGNOSIS — E640 Sequelae of protein-calorie malnutrition: Secondary | ICD-10-CM | POA: Diagnosis not present

## 2022-04-02 DIAGNOSIS — Z9981 Dependence on supplemental oxygen: Secondary | ICD-10-CM | POA: Diagnosis not present

## 2022-04-02 DIAGNOSIS — J449 Chronic obstructive pulmonary disease, unspecified: Secondary | ICD-10-CM | POA: Diagnosis not present

## 2022-04-02 DIAGNOSIS — I2510 Atherosclerotic heart disease of native coronary artery without angina pectoris: Secondary | ICD-10-CM | POA: Diagnosis not present

## 2022-04-02 DIAGNOSIS — I4891 Unspecified atrial fibrillation: Secondary | ICD-10-CM | POA: Diagnosis not present

## 2022-04-02 DIAGNOSIS — R569 Unspecified convulsions: Secondary | ICD-10-CM | POA: Diagnosis not present

## 2022-04-02 DIAGNOSIS — E43 Unspecified severe protein-calorie malnutrition: Secondary | ICD-10-CM | POA: Diagnosis not present

## 2022-04-02 DIAGNOSIS — J9601 Acute respiratory failure with hypoxia: Secondary | ICD-10-CM | POA: Diagnosis not present

## 2022-04-02 DIAGNOSIS — I639 Cerebral infarction, unspecified: Secondary | ICD-10-CM | POA: Diagnosis not present

## 2022-04-02 DIAGNOSIS — I10 Essential (primary) hypertension: Secondary | ICD-10-CM | POA: Diagnosis not present

## 2022-04-02 DIAGNOSIS — I69898 Other sequelae of other cerebrovascular disease: Secondary | ICD-10-CM | POA: Diagnosis not present

## 2022-04-02 DIAGNOSIS — K5900 Constipation, unspecified: Secondary | ICD-10-CM | POA: Diagnosis not present

## 2022-04-02 DIAGNOSIS — J9600 Acute respiratory failure, unspecified whether with hypoxia or hypercapnia: Secondary | ICD-10-CM | POA: Diagnosis not present

## 2022-04-02 DIAGNOSIS — K5904 Chronic idiopathic constipation: Secondary | ICD-10-CM | POA: Diagnosis not present

## 2022-04-02 DIAGNOSIS — Z7982 Long term (current) use of aspirin: Secondary | ICD-10-CM | POA: Diagnosis not present

## 2022-04-02 DIAGNOSIS — Z853 Personal history of malignant neoplasm of breast: Secondary | ICD-10-CM | POA: Diagnosis not present

## 2022-04-02 DIAGNOSIS — I480 Paroxysmal atrial fibrillation: Secondary | ICD-10-CM | POA: Diagnosis not present

## 2022-04-02 DIAGNOSIS — E46 Unspecified protein-calorie malnutrition: Secondary | ICD-10-CM | POA: Diagnosis not present

## 2022-04-02 DIAGNOSIS — J441 Chronic obstructive pulmonary disease with (acute) exacerbation: Secondary | ICD-10-CM | POA: Diagnosis not present

## 2022-04-02 DIAGNOSIS — I482 Chronic atrial fibrillation, unspecified: Secondary | ICD-10-CM | POA: Diagnosis not present

## 2022-04-02 DIAGNOSIS — G4089 Other seizures: Secondary | ICD-10-CM | POA: Diagnosis not present

## 2022-04-02 DIAGNOSIS — Z87891 Personal history of nicotine dependence: Secondary | ICD-10-CM | POA: Diagnosis not present

## 2022-04-02 DIAGNOSIS — I69398 Other sequelae of cerebral infarction: Secondary | ICD-10-CM | POA: Diagnosis not present

## 2022-04-02 DIAGNOSIS — I69314 Frontal lobe and executive function deficit following cerebral infarction: Secondary | ICD-10-CM | POA: Diagnosis not present

## 2022-04-02 DIAGNOSIS — G40909 Epilepsy, unspecified, not intractable, without status epilepticus: Secondary | ICD-10-CM | POA: Diagnosis not present

## 2022-04-02 DIAGNOSIS — I69954 Hemiplegia and hemiparesis following unspecified cerebrovascular disease affecting left non-dominant side: Secondary | ICD-10-CM | POA: Diagnosis not present

## 2022-04-02 DIAGNOSIS — M150 Primary generalized (osteo)arthritis: Secondary | ICD-10-CM | POA: Diagnosis not present

## 2022-04-02 DIAGNOSIS — M6281 Muscle weakness (generalized): Secondary | ICD-10-CM | POA: Diagnosis not present

## 2022-04-05 DIAGNOSIS — G4089 Other seizures: Secondary | ICD-10-CM | POA: Diagnosis not present

## 2022-04-05 DIAGNOSIS — E43 Unspecified severe protein-calorie malnutrition: Secondary | ICD-10-CM | POA: Diagnosis not present

## 2022-04-05 DIAGNOSIS — J9600 Acute respiratory failure, unspecified whether with hypoxia or hypercapnia: Secondary | ICD-10-CM | POA: Diagnosis not present

## 2022-04-05 DIAGNOSIS — K5904 Chronic idiopathic constipation: Secondary | ICD-10-CM | POA: Diagnosis not present

## 2022-04-05 DIAGNOSIS — M6281 Muscle weakness (generalized): Secondary | ICD-10-CM | POA: Diagnosis not present

## 2022-04-05 DIAGNOSIS — J441 Chronic obstructive pulmonary disease with (acute) exacerbation: Secondary | ICD-10-CM | POA: Diagnosis not present

## 2022-04-05 DIAGNOSIS — I10 Essential (primary) hypertension: Secondary | ICD-10-CM | POA: Diagnosis not present

## 2022-04-05 DIAGNOSIS — I482 Chronic atrial fibrillation, unspecified: Secondary | ICD-10-CM | POA: Diagnosis not present

## 2022-04-06 DIAGNOSIS — J9601 Acute respiratory failure with hypoxia: Secondary | ICD-10-CM | POA: Diagnosis not present

## 2022-04-06 DIAGNOSIS — I4821 Permanent atrial fibrillation: Secondary | ICD-10-CM | POA: Diagnosis not present

## 2022-04-06 DIAGNOSIS — I69898 Other sequelae of other cerebrovascular disease: Secondary | ICD-10-CM | POA: Diagnosis not present

## 2022-04-06 DIAGNOSIS — K5904 Chronic idiopathic constipation: Secondary | ICD-10-CM | POA: Diagnosis not present

## 2022-04-06 DIAGNOSIS — M6281 Muscle weakness (generalized): Secondary | ICD-10-CM | POA: Diagnosis not present

## 2022-04-06 DIAGNOSIS — J441 Chronic obstructive pulmonary disease with (acute) exacerbation: Secondary | ICD-10-CM | POA: Diagnosis not present

## 2022-04-06 DIAGNOSIS — G4089 Other seizures: Secondary | ICD-10-CM | POA: Diagnosis not present

## 2022-04-06 DIAGNOSIS — E640 Sequelae of protein-calorie malnutrition: Secondary | ICD-10-CM | POA: Diagnosis not present

## 2022-04-06 DIAGNOSIS — M150 Primary generalized (osteo)arthritis: Secondary | ICD-10-CM | POA: Diagnosis not present

## 2022-04-06 DIAGNOSIS — I10 Essential (primary) hypertension: Secondary | ICD-10-CM | POA: Diagnosis not present

## 2022-04-09 DIAGNOSIS — K5904 Chronic idiopathic constipation: Secondary | ICD-10-CM | POA: Diagnosis not present

## 2022-04-09 DIAGNOSIS — M6281 Muscle weakness (generalized): Secondary | ICD-10-CM | POA: Diagnosis not present

## 2022-04-09 DIAGNOSIS — G4089 Other seizures: Secondary | ICD-10-CM | POA: Diagnosis not present

## 2022-04-09 DIAGNOSIS — J441 Chronic obstructive pulmonary disease with (acute) exacerbation: Secondary | ICD-10-CM | POA: Diagnosis not present

## 2022-04-09 DIAGNOSIS — I482 Chronic atrial fibrillation, unspecified: Secondary | ICD-10-CM | POA: Diagnosis not present

## 2022-04-09 DIAGNOSIS — J9600 Acute respiratory failure, unspecified whether with hypoxia or hypercapnia: Secondary | ICD-10-CM | POA: Diagnosis not present

## 2022-04-09 DIAGNOSIS — E43 Unspecified severe protein-calorie malnutrition: Secondary | ICD-10-CM | POA: Diagnosis not present

## 2022-04-09 DIAGNOSIS — I10 Essential (primary) hypertension: Secondary | ICD-10-CM | POA: Diagnosis not present

## 2022-04-12 DIAGNOSIS — M6281 Muscle weakness (generalized): Secondary | ICD-10-CM | POA: Diagnosis not present

## 2022-04-12 DIAGNOSIS — G4089 Other seizures: Secondary | ICD-10-CM | POA: Diagnosis not present

## 2022-04-12 DIAGNOSIS — J9600 Acute respiratory failure, unspecified whether with hypoxia or hypercapnia: Secondary | ICD-10-CM | POA: Diagnosis not present

## 2022-04-12 DIAGNOSIS — J441 Chronic obstructive pulmonary disease with (acute) exacerbation: Secondary | ICD-10-CM | POA: Diagnosis not present

## 2022-04-12 DIAGNOSIS — I482 Chronic atrial fibrillation, unspecified: Secondary | ICD-10-CM | POA: Diagnosis not present

## 2022-04-12 DIAGNOSIS — E43 Unspecified severe protein-calorie malnutrition: Secondary | ICD-10-CM | POA: Diagnosis not present

## 2022-04-12 DIAGNOSIS — I10 Essential (primary) hypertension: Secondary | ICD-10-CM | POA: Diagnosis not present

## 2022-04-16 DIAGNOSIS — I482 Chronic atrial fibrillation, unspecified: Secondary | ICD-10-CM | POA: Diagnosis not present

## 2022-04-16 DIAGNOSIS — J9600 Acute respiratory failure, unspecified whether with hypoxia or hypercapnia: Secondary | ICD-10-CM | POA: Diagnosis not present

## 2022-04-16 DIAGNOSIS — E43 Unspecified severe protein-calorie malnutrition: Secondary | ICD-10-CM | POA: Diagnosis not present

## 2022-04-16 DIAGNOSIS — J441 Chronic obstructive pulmonary disease with (acute) exacerbation: Secondary | ICD-10-CM | POA: Diagnosis not present

## 2022-04-16 DIAGNOSIS — M6281 Muscle weakness (generalized): Secondary | ICD-10-CM | POA: Diagnosis not present

## 2022-04-16 DIAGNOSIS — G4089 Other seizures: Secondary | ICD-10-CM | POA: Diagnosis not present

## 2022-04-16 DIAGNOSIS — I10 Essential (primary) hypertension: Secondary | ICD-10-CM | POA: Diagnosis not present

## 2022-04-19 DIAGNOSIS — J441 Chronic obstructive pulmonary disease with (acute) exacerbation: Secondary | ICD-10-CM | POA: Diagnosis not present

## 2022-04-19 DIAGNOSIS — E640 Sequelae of protein-calorie malnutrition: Secondary | ICD-10-CM | POA: Diagnosis not present

## 2022-04-19 DIAGNOSIS — G4089 Other seizures: Secondary | ICD-10-CM | POA: Diagnosis not present

## 2022-04-19 DIAGNOSIS — E43 Unspecified severe protein-calorie malnutrition: Secondary | ICD-10-CM | POA: Diagnosis not present

## 2022-04-19 DIAGNOSIS — J9601 Acute respiratory failure with hypoxia: Secondary | ICD-10-CM | POA: Diagnosis not present

## 2022-04-19 DIAGNOSIS — M6281 Muscle weakness (generalized): Secondary | ICD-10-CM | POA: Diagnosis not present

## 2022-04-19 DIAGNOSIS — K5904 Chronic idiopathic constipation: Secondary | ICD-10-CM | POA: Diagnosis not present

## 2022-04-19 DIAGNOSIS — I4821 Permanent atrial fibrillation: Secondary | ICD-10-CM | POA: Diagnosis not present

## 2022-04-19 DIAGNOSIS — I10 Essential (primary) hypertension: Secondary | ICD-10-CM | POA: Diagnosis not present

## 2022-04-19 DIAGNOSIS — I69898 Other sequelae of other cerebrovascular disease: Secondary | ICD-10-CM | POA: Diagnosis not present

## 2022-04-23 DIAGNOSIS — J441 Chronic obstructive pulmonary disease with (acute) exacerbation: Secondary | ICD-10-CM | POA: Diagnosis not present

## 2022-04-23 DIAGNOSIS — G4089 Other seizures: Secondary | ICD-10-CM | POA: Diagnosis not present

## 2022-04-23 DIAGNOSIS — J9601 Acute respiratory failure with hypoxia: Secondary | ICD-10-CM | POA: Diagnosis not present

## 2022-04-23 DIAGNOSIS — J449 Chronic obstructive pulmonary disease, unspecified: Secondary | ICD-10-CM | POA: Diagnosis not present

## 2022-04-23 DIAGNOSIS — E640 Sequelae of protein-calorie malnutrition: Secondary | ICD-10-CM | POA: Diagnosis not present

## 2022-04-23 DIAGNOSIS — I69898 Other sequelae of other cerebrovascular disease: Secondary | ICD-10-CM | POA: Diagnosis not present

## 2022-04-23 DIAGNOSIS — M6281 Muscle weakness (generalized): Secondary | ICD-10-CM | POA: Diagnosis not present

## 2022-04-23 DIAGNOSIS — K5904 Chronic idiopathic constipation: Secondary | ICD-10-CM | POA: Diagnosis not present

## 2022-04-23 DIAGNOSIS — E43 Unspecified severe protein-calorie malnutrition: Secondary | ICD-10-CM | POA: Diagnosis not present

## 2022-04-23 DIAGNOSIS — I10 Essential (primary) hypertension: Secondary | ICD-10-CM | POA: Diagnosis not present

## 2022-04-23 DIAGNOSIS — I4821 Permanent atrial fibrillation: Secondary | ICD-10-CM | POA: Diagnosis not present

## 2022-04-23 DIAGNOSIS — J9600 Acute respiratory failure, unspecified whether with hypoxia or hypercapnia: Secondary | ICD-10-CM | POA: Diagnosis not present

## 2022-04-25 DIAGNOSIS — Z7951 Long term (current) use of inhaled steroids: Secondary | ICD-10-CM | POA: Diagnosis not present

## 2022-04-25 DIAGNOSIS — J9601 Acute respiratory failure with hypoxia: Secondary | ICD-10-CM | POA: Diagnosis not present

## 2022-04-25 DIAGNOSIS — I4821 Permanent atrial fibrillation: Secondary | ICD-10-CM | POA: Diagnosis not present

## 2022-04-25 DIAGNOSIS — K649 Unspecified hemorrhoids: Secondary | ICD-10-CM | POA: Diagnosis not present

## 2022-04-25 DIAGNOSIS — R471 Dysarthria and anarthria: Secondary | ICD-10-CM | POA: Diagnosis not present

## 2022-04-25 DIAGNOSIS — F17210 Nicotine dependence, cigarettes, uncomplicated: Secondary | ICD-10-CM | POA: Diagnosis not present

## 2022-04-25 DIAGNOSIS — M1990 Unspecified osteoarthritis, unspecified site: Secondary | ICD-10-CM | POA: Diagnosis not present

## 2022-04-25 DIAGNOSIS — I10 Essential (primary) hypertension: Secondary | ICD-10-CM | POA: Diagnosis not present

## 2022-04-25 DIAGNOSIS — Z9981 Dependence on supplemental oxygen: Secondary | ICD-10-CM | POA: Diagnosis not present

## 2022-04-25 DIAGNOSIS — Z853 Personal history of malignant neoplasm of breast: Secondary | ICD-10-CM | POA: Diagnosis not present

## 2022-04-25 DIAGNOSIS — K5904 Chronic idiopathic constipation: Secondary | ICD-10-CM | POA: Diagnosis not present

## 2022-04-25 DIAGNOSIS — G9340 Encephalopathy, unspecified: Secondary | ICD-10-CM | POA: Diagnosis not present

## 2022-04-25 DIAGNOSIS — E640 Sequelae of protein-calorie malnutrition: Secondary | ICD-10-CM | POA: Diagnosis not present

## 2022-04-25 DIAGNOSIS — M549 Dorsalgia, unspecified: Secondary | ICD-10-CM | POA: Diagnosis not present

## 2022-04-25 DIAGNOSIS — G4089 Other seizures: Secondary | ICD-10-CM | POA: Diagnosis not present

## 2022-04-25 DIAGNOSIS — J441 Chronic obstructive pulmonary disease with (acute) exacerbation: Secondary | ICD-10-CM | POA: Diagnosis not present

## 2022-04-25 DIAGNOSIS — I700 Atherosclerosis of aorta: Secondary | ICD-10-CM | POA: Diagnosis not present

## 2022-04-25 DIAGNOSIS — R32 Unspecified urinary incontinence: Secondary | ICD-10-CM | POA: Diagnosis not present

## 2022-04-25 DIAGNOSIS — I69354 Hemiplegia and hemiparesis following cerebral infarction affecting left non-dominant side: Secondary | ICD-10-CM | POA: Diagnosis not present

## 2022-04-26 ENCOUNTER — Telehealth: Payer: Self-pay | Admitting: Family Medicine

## 2022-04-26 NOTE — Telephone Encounter (Signed)
Home Health Verbal Orders - Caller/Agency: Olin Hauser from [facility] Requesting Skilled Nursing Frequency:  2x1 1x3 2x month 1  month 2prn visits for disease management and medicaiton

## 2022-04-27 ENCOUNTER — Telehealth: Payer: Self-pay

## 2022-04-27 NOTE — Telephone Encounter (Signed)
Olin Hauser at [facility] called to get skilled nursing for pt 2x1, 1x3, 2x month, 1x month, and 2 PRN visits- left the okay on vm. [phone]

## 2022-04-28 DIAGNOSIS — Z853 Personal history of malignant neoplasm of breast: Secondary | ICD-10-CM | POA: Diagnosis not present

## 2022-04-28 DIAGNOSIS — K5904 Chronic idiopathic constipation: Secondary | ICD-10-CM | POA: Diagnosis not present

## 2022-04-28 DIAGNOSIS — Z9981 Dependence on supplemental oxygen: Secondary | ICD-10-CM | POA: Diagnosis not present

## 2022-04-28 DIAGNOSIS — G9340 Encephalopathy, unspecified: Secondary | ICD-10-CM | POA: Diagnosis not present

## 2022-04-28 DIAGNOSIS — J441 Chronic obstructive pulmonary disease with (acute) exacerbation: Secondary | ICD-10-CM | POA: Diagnosis not present

## 2022-04-28 DIAGNOSIS — I700 Atherosclerosis of aorta: Secondary | ICD-10-CM | POA: Diagnosis not present

## 2022-04-28 DIAGNOSIS — R471 Dysarthria and anarthria: Secondary | ICD-10-CM | POA: Diagnosis not present

## 2022-04-28 DIAGNOSIS — E640 Sequelae of protein-calorie malnutrition: Secondary | ICD-10-CM | POA: Diagnosis not present

## 2022-04-28 DIAGNOSIS — F17210 Nicotine dependence, cigarettes, uncomplicated: Secondary | ICD-10-CM | POA: Diagnosis not present

## 2022-04-28 DIAGNOSIS — I69354 Hemiplegia and hemiparesis following cerebral infarction affecting left non-dominant side: Secondary | ICD-10-CM | POA: Diagnosis not present

## 2022-04-28 DIAGNOSIS — Z7951 Long term (current) use of inhaled steroids: Secondary | ICD-10-CM | POA: Diagnosis not present

## 2022-04-28 DIAGNOSIS — G4089 Other seizures: Secondary | ICD-10-CM | POA: Diagnosis not present

## 2022-04-28 DIAGNOSIS — M1990 Unspecified osteoarthritis, unspecified site: Secondary | ICD-10-CM | POA: Diagnosis not present

## 2022-04-28 DIAGNOSIS — I10 Essential (primary) hypertension: Secondary | ICD-10-CM | POA: Diagnosis not present

## 2022-04-28 DIAGNOSIS — K649 Unspecified hemorrhoids: Secondary | ICD-10-CM | POA: Diagnosis not present

## 2022-04-28 DIAGNOSIS — R32 Unspecified urinary incontinence: Secondary | ICD-10-CM | POA: Diagnosis not present

## 2022-04-28 DIAGNOSIS — M549 Dorsalgia, unspecified: Secondary | ICD-10-CM | POA: Diagnosis not present

## 2022-04-28 DIAGNOSIS — I4821 Permanent atrial fibrillation: Secondary | ICD-10-CM | POA: Diagnosis not present

## 2022-04-28 DIAGNOSIS — J9601 Acute respiratory failure with hypoxia: Secondary | ICD-10-CM | POA: Diagnosis not present

## 2022-04-29 ENCOUNTER — Ambulatory Visit (INDEPENDENT_AMBULATORY_CARE_PROVIDER_SITE_OTHER): Payer: PPO | Admitting: Family Medicine

## 2022-04-29 ENCOUNTER — Telehealth: Payer: Self-pay | Admitting: Family Medicine

## 2022-04-29 ENCOUNTER — Encounter: Payer: Self-pay | Admitting: Family Medicine

## 2022-04-29 VITALS — BP 130/80 | HR 80 | Ht 61.0 in | Wt 82.0 lb

## 2022-04-29 DIAGNOSIS — G40909 Epilepsy, unspecified, not intractable, without status epilepticus: Secondary | ICD-10-CM

## 2022-04-29 DIAGNOSIS — J432 Centrilobular emphysema: Secondary | ICD-10-CM

## 2022-04-29 DIAGNOSIS — E7801 Familial hypercholesterolemia: Secondary | ICD-10-CM

## 2022-04-29 DIAGNOSIS — Z789 Other specified health status: Secondary | ICD-10-CM

## 2022-04-29 MED ORDER — LEVETIRACETAM 750 MG PO TABS: 750.0000 mg | ORAL_TABLET | Freq: Two times a day (BID) | ORAL | 0 refills | Status: DC

## 2022-04-29 MED ORDER — EZETIMIBE 10 MG PO TABS: 10.0000 mg | ORAL_TABLET | Freq: Every day | ORAL | 3 refills | Status: DC

## 2022-04-29 NOTE — Progress Notes (Signed)
Date:  04/29/2022   Name:  Gabriella Wiggins   DOB:  12/24/1940   MRN:  [id number]   Chief Complaint: Follow-up [facility]- seeing Dr Nadara Mustard in Neurology at [facility] on Oct 3rd. Needs refill on Keppra until seeing him. ), Hyperlipidemia (LDL was 114- pt states "cannot take Lipitor, cause Body aches"), and COPD  Hyperlipidemia This is a chronic problem. The current episode started more than 1 year ago. The problem is uncontrolled. Recent lipid tests were reviewed and are normal. She has no history of chronic renal disease, diabetes, hypothyroidism, liver disease, obesity or nephrotic syndrome. There are no known factors aggravating her hyperlipidemia. Pertinent negatives include no chest pain, focal sensory loss, focal weakness, leg pain, myalgias or shortness of breath. Current antihyperlipidemic treatment includes ezetimibe. The current treatment provides mild improvement of lipids.  COPD There is no chest tightness, cough, difficulty breathing, frequent throat clearing, hemoptysis, hoarse voice, shortness of breath, sputum production or wheezing. This is a chronic problem. The current episode started more than 1 year ago. The problem has been waxing and waning. Pertinent negatives include no appetite change, chest pain, myalgias, PND or sneezing. She reports moderate improvement on treatment. Her past medical history is significant for COPD and emphysema.    Lab Results  Component Value Date   NA 142 05/21/2019   K 4.4 05/21/2019   CO2 30 (H) 05/21/2019   GLUCOSE 77 05/21/2019   BUN 14 05/21/2019   CREATININE 0.82 05/21/2019   CALCIUM 10.7 (H) 05/21/2019   GFRNONAA 70 05/21/2019   Lab Results  Component Value Date   CHOL 150 09/01/2018   HDL 21 (L) 09/01/2018   LDLCALC 111 (H) 09/01/2018   TRIG 91 09/01/2018   CHOLHDL 7.1 09/01/2018   Lab Results  Component Value Date   TSH 1.570 06/06/2018   No results found for: "HGBA1C" Lab Results  Component Value Date   WBC 6.2 05/21/2019    HGB 12.8 05/21/2019   HCT 38.2 05/21/2019   MCV 95 05/21/2019   PLT 290 05/21/2019   Lab Results  Component Value Date   ALT 11 05/21/2019   AST 25 05/21/2019   ALKPHOS 158 (H) 05/21/2019   BILITOT 0.5 05/21/2019   No results found for: "25OHVITD2", "25OHVITD3", "VD25OH"   Review of Systems  Constitutional:  Negative for appetite change.  HENT:  Negative for hoarse voice, sinus pressure and sneezing.   Respiratory:  Negative for cough, hemoptysis, sputum production, chest tightness, shortness of breath, wheezing and stridor.   Cardiovascular:  Negative for chest pain, palpitations, leg swelling and PND.  Musculoskeletal:  Negative for myalgias.  Neurological:  Negative for focal weakness.    Patient Active Problem List   Diagnosis Date Noted   Generalized edema 10/24/2019   Atherosclerosis of native coronary artery of native heart with angina pectoris ([place]) 05/21/2019   Protein-calorie malnutrition, severe 09/02/2018   Acute respiratory failure with hypoxia ([place]) 08/31/2018   Atherosclerosis of aorta ([place]) 06/06/2018   PMB (postmenopausal bleeding) 06/27/2017   Genetic testing 06/08/2017   Centrilobular emphysema ([place]) 09/09/2016   Hematuria, gross 07/24/2015    Allergies  Allergen Reactions   Penicillin G Other (See Comments)   Penicillins Hives    Has patient had a PCN reaction causing immediate rash, facial/tongue/throat swelling, SOB or lightheadedness with hypotension: No Has patient had a PCN reaction causing severe rash involving mucus membranes or skin necrosis: No Has patient had a PCN reaction that required hospitalization: No Has patient  had a PCN reaction occurring within the last 10 years: No If all of the above answers are "NO", then may proceed with Cephalosporin use.    Shrimp [Shellfish Allergy] Swelling    Past Surgical History:  Procedure Laterality Date   APPENDECTOMY     bilateral tubal     BREAST BIOPSY Right    milk duct removed -neg    BREAST BIOPSY Left 2004   lumpectomy/rad   BREAST LUMPECTOMY Left    DILATATION & CURETTAGE/HYSTEROSCOPY WITH MYOSURE N/A 06/20/2017   Procedure: DILATATION & CURETTAGE/HYSTEROSCOPY WITH MYOSURE;  Surgeon: Schermerhorn, Gwen Her, MD;  Location: [facility] ORS;  Service: Gynecology;  Laterality: N/A;   HYSTEROSCOPY WITH D & C N/A 06/20/2017   Procedure: DILATATION AND CURETTAGE /HYSTEROSCOPY;  Surgeon: Schermerhorn, Gwen Her, MD;  Location: [facility] ORS;  Service: Gynecology;  Laterality: N/A;   KYPHOPLASTY N/A 05/29/2019   Procedure: T11 KYPHOPLASTY;  Surgeon: Hessie Knows, MD;  Location: [facility] ORS;  Service: Orthopedics;  Laterality: N/A;   OVARIAN CYST SURGERY      Social History   Tobacco Use   Smoking status: Former    Packs/day: 1.00    Years: 55.00    Total pack years: 55.00    Types: Cigarettes    Quit date: 08/27/2018    Years since quitting: 3.6   Smokeless tobacco: Never  Vaping Use   Vaping Use: Never used  Substance Use Topics   Alcohol use: No    Alcohol/week: 0.0 standard drinks of alcohol   Drug use: No     Medication list has been reviewed and updated.  Current Meds  Medication Sig   acetaminophen (TYLENOL) 325 MG tablet Take 1 tablet (325 mg total) by mouth every 6 (six) hours as needed for mild pain (or Fever >/= 101).   arformoterol (BROVANA) 15 MCG/2ML NEBU Inhale into the lungs.   aspirin 81 MG chewable tablet Chew 1 tablet by mouth daily.   aspirin EC 81 MG tablet Take 81 mg by mouth every other day.    BREO ELLIPTA 100-25 MCG/INH AEPB Inhale 1 puff into the lungs daily.   budesonide (PULMICORT) 0.5 MG/2ML nebulizer solution Inhale into the lungs.   carboxymethylcellulose (REFRESH PLUS) 0.5 % SOLN Place 1 drop into both eyes 2 (two) times daily as needed (dry eyes).   carvedilol (COREG) 6.25 MG tablet Take 1 tablet by mouth 2 (two) times daily.   cholecalciferol (VITAMIN D3) 25 MCG (1000 UT) tablet Take 1,000 Units by mouth daily.   COMBIVENT RESPIMAT 20-100  MCG/ACT AERS respimat INHALE 1 PUFF INTO THE LUNGS 4 TIMES A DAY   furosemide (LASIX) 20 MG tablet TAKE 1 TABLET BY MOUTH EVERY DAY *PATIENT NEEDS OFFICE VISIT*   ipratropium-albuterol (DUONEB) 0.5-2.5 (3) MG/3ML SOLN USE 1 VIAL VIA NEBULIZER EVERY 6 HOURS AS NEEDED   levETIRAcetam (KEPPRA) 750 MG tablet Take 1 tablet by mouth 2 (two) times daily.   PROAIR HFA 108 (90 Base) MCG/ACT inhaler INHALE 1-2 PUFFS INTO THE LUNGS EVERY 6 HOURS AS NEEDED.   pyridOXINE (VITAMIN B-6) 100 MG tablet Take 200 mg by mouth daily.        04/29/2022    2:56 PM 04/22/2020   10:10 AM 10/24/2019    9:50 AM  GAD 7 : Generalized Anxiety Score  Nervous, Anxious, on Edge 0 2 0  Control/stop worrying 0 0 0  Worry too much - different things 0 0 0  Trouble relaxing 0 0 0  Restless 0 0 0  Easily annoyed or irritable 0 0 0  Afraid - awful might happen 0 0 0  Total GAD 7 Score 0 2 0  Anxiety Difficulty Not difficult at all Not difficult at all        04/29/2022    2:56 PM 04/22/2020   10:10 AM 02/18/2020   10:56 AM  Depression screen PHQ 2/9  Decreased Interest 0 0 0  Down, Depressed, Hopeless 0 0 0  PHQ - 2 Score 0 0 0  Altered sleeping 0 0   Tired, decreased energy 0 0   Change in appetite 0 0   Feeling bad or failure about yourself  0 0   Trouble concentrating 0 0   Moving slowly or fidgety/restless 0 0   Suicidal thoughts 0 0   PHQ-9 Score 0 0   Difficult doing work/chores Not difficult at all      BP Readings from Last 3 Encounters:  04/29/22 130/80  04/22/20 (!) 130/80  10/24/19 118/76    Physical Exam Vitals and nursing note reviewed. Exam conducted with a chaperone present.  Constitutional:      General: She is not in acute distress.    Appearance: She is not diaphoretic.  HENT:     Head: Normocephalic and atraumatic.     Right Ear: External ear normal.     Left Ear: External ear normal.     Nose: Nose normal.  Eyes:     General:        Right eye: No discharge.        Left eye: No  discharge.     Conjunctiva/sclera: Conjunctivae normal.     Pupils: Pupils are equal, round, and reactive to light.  Neck:     Thyroid: No thyromegaly.     Vascular: No JVD.  Cardiovascular:     Rate and Rhythm: Normal rate and regular rhythm.     Heart sounds: Normal heart sounds. No murmur heard.    No friction rub. No gallop.  Pulmonary:     Effort: Pulmonary effort is normal.     Breath sounds: Normal breath sounds.  Abdominal:     General: Bowel sounds are normal.     Palpations: Abdomen is soft. There is no mass.     Tenderness: There is no abdominal tenderness. There is no guarding.  Musculoskeletal:        General: Normal range of motion.     Cervical back: Normal range of motion and neck supple.  Lymphadenopathy:     Cervical: No cervical adenopathy.  Skin:    General: Skin is warm and dry.  Neurological:     Mental Status: She is alert.     Deep Tendon Reflexes: Reflexes are normal and symmetric.     Wt Readings from Last 3 Encounters:  04/29/22 82 lb (37.2 kg)  04/22/20 (!) 94 lb (42.6 kg)  10/24/19 94 lb (42.6 kg)    BP 130/80   Pulse 80   Ht '5\' 1"'$  (1.549 m)   Wt 82 lb (37.2 kg)   BMI 15.49 kg/m   Assessment and Plan:  1. Centrilobular emphysema (HCC) Chronic.  Presently stable and controlled.  Patient apparently had lost use of her nebulizer and had gradually worsened to the point that it sounded like she was having apneic episodes with gasping which may have led to hypoxia which may have led to a seizure-like circumstance.  Patient was hospitalized in [place] for respiratory failure and new onset seizure and  presents today for reevaluation.  By history and by examination patient is stable from a pulmonary standpoint but we will refer to Dr. Raul Del to review medications and inhalers and to reemphasize the regular use of her nebulized medications. - Ambulatory referral to Pulmonology  2. Seizure disorder ([place]) New onset.  Follow[facility] by [facility].   Recently initiated on Keppra 750 twice a day.  We will refer reviewed the appointment with the patient's spouse and he understands where the office for neurology is we will refill Keppra at the current dosing. - levETIRAcetam (KEPPRA) 750 MG tablet; Take 1 tablet (750 mg total) by mouth 2 (two) times daily.  Dispense: 60 tablet; Refill: 0  3. Statin intolerance Patient has a history of intolerance to Zetia and there was some suggestion given elevated triglycerides and cholesterol that patient needed because of her likely cerebrovascular disease.  We will initiate Zetia 10 mg once a day and that the patient refuses statins at this time - ezetimibe (ZETIA) 10 MG tablet; Take 1 tablet (10 mg total) by mouth daily.  Dispense: 30 tablet; Refill: 3  4. Familial hypercholesterolemia As noted above - ezetimibe (ZETIA) 10 MG tablet; Take 1 tablet (10 mg total) by mouth daily.  Dispense: 30 tablet; Refill: 3

## 2022-04-29 NOTE — Telephone Encounter (Signed)
Copied from [place] [id number]. Topic: Quick Communication - Home Health Verbal Orders >> Apr 29, 2022  3:59 PM Everette C wrote: Caller/Agency: [facility] / [place] Number:  [phone] Requesting OT/PT/Skilled Nursing/Social Work/Speech Therapy: PT  Frequency: 2e4 1w4

## 2022-04-30 ENCOUNTER — Telehealth: Payer: Self-pay | Admitting: Family Medicine

## 2022-04-30 NOTE — Telephone Encounter (Signed)
Sent to [facility].

## 2022-04-30 NOTE — Telephone Encounter (Signed)
Gabriella Wiggins called in with [facility], is calling in to request verbal orders to work with pt.    OT: Frequency: 1 week 8  CB: +1 [phone] - secure voicemail

## 2022-04-30 NOTE — Telephone Encounter (Signed)
Called [place] gave verbal orders. He verbalized understanding.  KP

## 2022-04-30 NOTE — Telephone Encounter (Signed)
Called Gabriella Wiggins left VM giving verbal orders. Name was stated on VM.  KP

## 2022-05-05 ENCOUNTER — Ambulatory Visit: Payer: Self-pay | Admitting: *Deleted

## 2022-05-05 NOTE — Patient Instructions (Signed)
Lynelle Doctor  At some point during the past 4 years, I have worked with you through the [place] (CCM) at [place]. We have not worked together within the past 6 months.   Because you are no longer a current primary care patient at [facility], I am removing myself from your care team.   Please reach out with any questions.   Chong Sicilian, BSN, RN-BC Proofreader Dial: [phone]

## 2022-05-05 NOTE — Chronic Care Management (AMB) (Signed)
  Chronic Care Management   Note  05/05/2022 Name: Gabriella Wiggins MRN: [id number] DOB: 03-Sep-1941  Due to changes in the Chronic Care Management (CCM) Program at [facility] I am reviewing and closing old Care Management Care plans. Since patient is no longer a primary care patient at [facility], I am removing myself as the RN Care Manager from the Care Team and closing any RN Care Management Care Plans.   CCM enrollment status changed to "not enrolled" due to [facility] not being their Primary Care Provider.  Chong Sicilian, BSN, RN-BC Proofreader Dial: [phone]

## 2022-05-12 DIAGNOSIS — K649 Unspecified hemorrhoids: Secondary | ICD-10-CM | POA: Diagnosis not present

## 2022-05-12 DIAGNOSIS — M1990 Unspecified osteoarthritis, unspecified site: Secondary | ICD-10-CM | POA: Diagnosis not present

## 2022-05-12 DIAGNOSIS — R32 Unspecified urinary incontinence: Secondary | ICD-10-CM | POA: Diagnosis not present

## 2022-05-12 DIAGNOSIS — G4089 Other seizures: Secondary | ICD-10-CM | POA: Diagnosis not present

## 2022-05-12 DIAGNOSIS — I700 Atherosclerosis of aorta: Secondary | ICD-10-CM | POA: Diagnosis not present

## 2022-05-12 DIAGNOSIS — Z9981 Dependence on supplemental oxygen: Secondary | ICD-10-CM | POA: Diagnosis not present

## 2022-05-12 DIAGNOSIS — G9340 Encephalopathy, unspecified: Secondary | ICD-10-CM | POA: Diagnosis not present

## 2022-05-12 DIAGNOSIS — I10 Essential (primary) hypertension: Secondary | ICD-10-CM | POA: Diagnosis not present

## 2022-05-12 DIAGNOSIS — Z7951 Long term (current) use of inhaled steroids: Secondary | ICD-10-CM | POA: Diagnosis not present

## 2022-05-12 DIAGNOSIS — J9601 Acute respiratory failure with hypoxia: Secondary | ICD-10-CM | POA: Diagnosis not present

## 2022-05-12 DIAGNOSIS — M549 Dorsalgia, unspecified: Secondary | ICD-10-CM | POA: Diagnosis not present

## 2022-05-12 DIAGNOSIS — E640 Sequelae of protein-calorie malnutrition: Secondary | ICD-10-CM | POA: Diagnosis not present

## 2022-05-12 DIAGNOSIS — I4821 Permanent atrial fibrillation: Secondary | ICD-10-CM | POA: Diagnosis not present

## 2022-05-12 DIAGNOSIS — F17210 Nicotine dependence, cigarettes, uncomplicated: Secondary | ICD-10-CM | POA: Diagnosis not present

## 2022-05-12 DIAGNOSIS — R471 Dysarthria and anarthria: Secondary | ICD-10-CM | POA: Diagnosis not present

## 2022-05-12 DIAGNOSIS — I69354 Hemiplegia and hemiparesis following cerebral infarction affecting left non-dominant side: Secondary | ICD-10-CM | POA: Diagnosis not present

## 2022-05-12 DIAGNOSIS — K5904 Chronic idiopathic constipation: Secondary | ICD-10-CM | POA: Diagnosis not present

## 2022-05-12 DIAGNOSIS — J441 Chronic obstructive pulmonary disease with (acute) exacerbation: Secondary | ICD-10-CM | POA: Diagnosis not present

## 2022-05-12 DIAGNOSIS — Z853 Personal history of malignant neoplasm of breast: Secondary | ICD-10-CM | POA: Diagnosis not present

## 2022-05-18 DIAGNOSIS — I4821 Permanent atrial fibrillation: Secondary | ICD-10-CM | POA: Diagnosis not present

## 2022-05-18 DIAGNOSIS — K5904 Chronic idiopathic constipation: Secondary | ICD-10-CM | POA: Diagnosis not present

## 2022-05-18 DIAGNOSIS — Z853 Personal history of malignant neoplasm of breast: Secondary | ICD-10-CM | POA: Diagnosis not present

## 2022-05-18 DIAGNOSIS — J9601 Acute respiratory failure with hypoxia: Secondary | ICD-10-CM | POA: Diagnosis not present

## 2022-05-18 DIAGNOSIS — G9340 Encephalopathy, unspecified: Secondary | ICD-10-CM | POA: Diagnosis not present

## 2022-05-18 DIAGNOSIS — M1990 Unspecified osteoarthritis, unspecified site: Secondary | ICD-10-CM | POA: Diagnosis not present

## 2022-05-18 DIAGNOSIS — Z9981 Dependence on supplemental oxygen: Secondary | ICD-10-CM | POA: Diagnosis not present

## 2022-05-18 DIAGNOSIS — J441 Chronic obstructive pulmonary disease with (acute) exacerbation: Secondary | ICD-10-CM | POA: Diagnosis not present

## 2022-05-18 DIAGNOSIS — K649 Unspecified hemorrhoids: Secondary | ICD-10-CM | POA: Diagnosis not present

## 2022-05-18 DIAGNOSIS — M549 Dorsalgia, unspecified: Secondary | ICD-10-CM | POA: Diagnosis not present

## 2022-05-18 DIAGNOSIS — I10 Essential (primary) hypertension: Secondary | ICD-10-CM | POA: Diagnosis not present

## 2022-05-18 DIAGNOSIS — I700 Atherosclerosis of aorta: Secondary | ICD-10-CM | POA: Diagnosis not present

## 2022-05-18 DIAGNOSIS — R471 Dysarthria and anarthria: Secondary | ICD-10-CM | POA: Diagnosis not present

## 2022-05-18 DIAGNOSIS — Z7951 Long term (current) use of inhaled steroids: Secondary | ICD-10-CM | POA: Diagnosis not present

## 2022-05-18 DIAGNOSIS — R32 Unspecified urinary incontinence: Secondary | ICD-10-CM | POA: Diagnosis not present

## 2022-05-18 DIAGNOSIS — F17210 Nicotine dependence, cigarettes, uncomplicated: Secondary | ICD-10-CM | POA: Diagnosis not present

## 2022-05-18 DIAGNOSIS — E640 Sequelae of protein-calorie malnutrition: Secondary | ICD-10-CM | POA: Diagnosis not present

## 2022-05-18 DIAGNOSIS — I69354 Hemiplegia and hemiparesis following cerebral infarction affecting left non-dominant side: Secondary | ICD-10-CM | POA: Diagnosis not present

## 2022-05-18 DIAGNOSIS — G4089 Other seizures: Secondary | ICD-10-CM | POA: Diagnosis not present

## 2022-05-24 ENCOUNTER — Other Ambulatory Visit: Payer: Self-pay | Admitting: Family Medicine

## 2022-05-24 DIAGNOSIS — G40909 Epilepsy, unspecified, not intractable, without status epilepticus: Secondary | ICD-10-CM

## 2022-05-25 DIAGNOSIS — R471 Dysarthria and anarthria: Secondary | ICD-10-CM | POA: Diagnosis not present

## 2022-05-25 DIAGNOSIS — J441 Chronic obstructive pulmonary disease with (acute) exacerbation: Secondary | ICD-10-CM | POA: Diagnosis not present

## 2022-05-25 DIAGNOSIS — Z9981 Dependence on supplemental oxygen: Secondary | ICD-10-CM | POA: Diagnosis not present

## 2022-05-25 DIAGNOSIS — F17210 Nicotine dependence, cigarettes, uncomplicated: Secondary | ICD-10-CM | POA: Diagnosis not present

## 2022-05-25 DIAGNOSIS — I700 Atherosclerosis of aorta: Secondary | ICD-10-CM | POA: Diagnosis not present

## 2022-05-25 DIAGNOSIS — I69354 Hemiplegia and hemiparesis following cerebral infarction affecting left non-dominant side: Secondary | ICD-10-CM | POA: Diagnosis not present

## 2022-05-25 DIAGNOSIS — Z7951 Long term (current) use of inhaled steroids: Secondary | ICD-10-CM | POA: Diagnosis not present

## 2022-05-25 DIAGNOSIS — K649 Unspecified hemorrhoids: Secondary | ICD-10-CM | POA: Diagnosis not present

## 2022-05-25 DIAGNOSIS — E640 Sequelae of protein-calorie malnutrition: Secondary | ICD-10-CM | POA: Diagnosis not present

## 2022-05-25 DIAGNOSIS — M1990 Unspecified osteoarthritis, unspecified site: Secondary | ICD-10-CM | POA: Diagnosis not present

## 2022-05-25 DIAGNOSIS — M549 Dorsalgia, unspecified: Secondary | ICD-10-CM | POA: Diagnosis not present

## 2022-05-25 DIAGNOSIS — G9340 Encephalopathy, unspecified: Secondary | ICD-10-CM | POA: Diagnosis not present

## 2022-05-25 DIAGNOSIS — J9601 Acute respiratory failure with hypoxia: Secondary | ICD-10-CM | POA: Diagnosis not present

## 2022-05-25 DIAGNOSIS — R32 Unspecified urinary incontinence: Secondary | ICD-10-CM | POA: Diagnosis not present

## 2022-05-25 DIAGNOSIS — I10 Essential (primary) hypertension: Secondary | ICD-10-CM | POA: Diagnosis not present

## 2022-05-25 DIAGNOSIS — Z853 Personal history of malignant neoplasm of breast: Secondary | ICD-10-CM | POA: Diagnosis not present

## 2022-05-25 DIAGNOSIS — G4089 Other seizures: Secondary | ICD-10-CM | POA: Diagnosis not present

## 2022-05-25 DIAGNOSIS — K5904 Chronic idiopathic constipation: Secondary | ICD-10-CM | POA: Diagnosis not present

## 2022-05-25 DIAGNOSIS — I4821 Permanent atrial fibrillation: Secondary | ICD-10-CM | POA: Diagnosis not present

## 2022-05-28 DIAGNOSIS — M1990 Unspecified osteoarthritis, unspecified site: Secondary | ICD-10-CM | POA: Diagnosis not present

## 2022-05-28 DIAGNOSIS — R32 Unspecified urinary incontinence: Secondary | ICD-10-CM | POA: Diagnosis not present

## 2022-05-28 DIAGNOSIS — E640 Sequelae of protein-calorie malnutrition: Secondary | ICD-10-CM | POA: Diagnosis not present

## 2022-05-28 DIAGNOSIS — I69354 Hemiplegia and hemiparesis following cerebral infarction affecting left non-dominant side: Secondary | ICD-10-CM | POA: Diagnosis not present

## 2022-05-28 DIAGNOSIS — K649 Unspecified hemorrhoids: Secondary | ICD-10-CM | POA: Diagnosis not present

## 2022-05-28 DIAGNOSIS — F17210 Nicotine dependence, cigarettes, uncomplicated: Secondary | ICD-10-CM | POA: Diagnosis not present

## 2022-05-28 DIAGNOSIS — G9340 Encephalopathy, unspecified: Secondary | ICD-10-CM | POA: Diagnosis not present

## 2022-05-28 DIAGNOSIS — I4821 Permanent atrial fibrillation: Secondary | ICD-10-CM | POA: Diagnosis not present

## 2022-05-28 DIAGNOSIS — J9601 Acute respiratory failure with hypoxia: Secondary | ICD-10-CM | POA: Diagnosis not present

## 2022-05-28 DIAGNOSIS — G4089 Other seizures: Secondary | ICD-10-CM | POA: Diagnosis not present

## 2022-05-28 DIAGNOSIS — Z7951 Long term (current) use of inhaled steroids: Secondary | ICD-10-CM | POA: Diagnosis not present

## 2022-05-28 DIAGNOSIS — Z853 Personal history of malignant neoplasm of breast: Secondary | ICD-10-CM | POA: Diagnosis not present

## 2022-05-28 DIAGNOSIS — I10 Essential (primary) hypertension: Secondary | ICD-10-CM | POA: Diagnosis not present

## 2022-05-28 DIAGNOSIS — Z9981 Dependence on supplemental oxygen: Secondary | ICD-10-CM | POA: Diagnosis not present

## 2022-05-28 DIAGNOSIS — K5904 Chronic idiopathic constipation: Secondary | ICD-10-CM | POA: Diagnosis not present

## 2022-05-28 DIAGNOSIS — R471 Dysarthria and anarthria: Secondary | ICD-10-CM | POA: Diagnosis not present

## 2022-05-28 DIAGNOSIS — M549 Dorsalgia, unspecified: Secondary | ICD-10-CM | POA: Diagnosis not present

## 2022-05-28 DIAGNOSIS — I700 Atherosclerosis of aorta: Secondary | ICD-10-CM | POA: Diagnosis not present

## 2022-05-28 DIAGNOSIS — J441 Chronic obstructive pulmonary disease with (acute) exacerbation: Secondary | ICD-10-CM | POA: Diagnosis not present

## 2022-06-02 DIAGNOSIS — Z853 Personal history of malignant neoplasm of breast: Secondary | ICD-10-CM | POA: Diagnosis not present

## 2022-06-02 DIAGNOSIS — J9601 Acute respiratory failure with hypoxia: Secondary | ICD-10-CM | POA: Diagnosis not present

## 2022-06-02 DIAGNOSIS — K5904 Chronic idiopathic constipation: Secondary | ICD-10-CM | POA: Diagnosis not present

## 2022-06-02 DIAGNOSIS — Z9981 Dependence on supplemental oxygen: Secondary | ICD-10-CM | POA: Diagnosis not present

## 2022-06-02 DIAGNOSIS — E640 Sequelae of protein-calorie malnutrition: Secondary | ICD-10-CM | POA: Diagnosis not present

## 2022-06-02 DIAGNOSIS — M549 Dorsalgia, unspecified: Secondary | ICD-10-CM | POA: Diagnosis not present

## 2022-06-02 DIAGNOSIS — G9340 Encephalopathy, unspecified: Secondary | ICD-10-CM | POA: Diagnosis not present

## 2022-06-02 DIAGNOSIS — R471 Dysarthria and anarthria: Secondary | ICD-10-CM | POA: Diagnosis not present

## 2022-06-02 DIAGNOSIS — I69354 Hemiplegia and hemiparesis following cerebral infarction affecting left non-dominant side: Secondary | ICD-10-CM | POA: Diagnosis not present

## 2022-06-02 DIAGNOSIS — I10 Essential (primary) hypertension: Secondary | ICD-10-CM | POA: Diagnosis not present

## 2022-06-02 DIAGNOSIS — I700 Atherosclerosis of aorta: Secondary | ICD-10-CM | POA: Diagnosis not present

## 2022-06-02 DIAGNOSIS — F17210 Nicotine dependence, cigarettes, uncomplicated: Secondary | ICD-10-CM | POA: Diagnosis not present

## 2022-06-02 DIAGNOSIS — K649 Unspecified hemorrhoids: Secondary | ICD-10-CM | POA: Diagnosis not present

## 2022-06-02 DIAGNOSIS — I4821 Permanent atrial fibrillation: Secondary | ICD-10-CM | POA: Diagnosis not present

## 2022-06-02 DIAGNOSIS — M1990 Unspecified osteoarthritis, unspecified site: Secondary | ICD-10-CM | POA: Diagnosis not present

## 2022-06-02 DIAGNOSIS — Z7951 Long term (current) use of inhaled steroids: Secondary | ICD-10-CM | POA: Diagnosis not present

## 2022-06-02 DIAGNOSIS — J441 Chronic obstructive pulmonary disease with (acute) exacerbation: Secondary | ICD-10-CM | POA: Diagnosis not present

## 2022-06-02 DIAGNOSIS — R32 Unspecified urinary incontinence: Secondary | ICD-10-CM | POA: Diagnosis not present

## 2022-06-02 DIAGNOSIS — G4089 Other seizures: Secondary | ICD-10-CM | POA: Diagnosis not present

## 2022-06-09 DIAGNOSIS — R32 Unspecified urinary incontinence: Secondary | ICD-10-CM | POA: Diagnosis not present

## 2022-06-09 DIAGNOSIS — I69354 Hemiplegia and hemiparesis following cerebral infarction affecting left non-dominant side: Secondary | ICD-10-CM | POA: Diagnosis not present

## 2022-06-09 DIAGNOSIS — M549 Dorsalgia, unspecified: Secondary | ICD-10-CM | POA: Diagnosis not present

## 2022-06-09 DIAGNOSIS — Z853 Personal history of malignant neoplasm of breast: Secondary | ICD-10-CM | POA: Diagnosis not present

## 2022-06-09 DIAGNOSIS — G4089 Other seizures: Secondary | ICD-10-CM | POA: Diagnosis not present

## 2022-06-09 DIAGNOSIS — I700 Atherosclerosis of aorta: Secondary | ICD-10-CM | POA: Diagnosis not present

## 2022-06-09 DIAGNOSIS — I4821 Permanent atrial fibrillation: Secondary | ICD-10-CM | POA: Diagnosis not present

## 2022-06-09 DIAGNOSIS — R471 Dysarthria and anarthria: Secondary | ICD-10-CM | POA: Diagnosis not present

## 2022-06-09 DIAGNOSIS — Z7951 Long term (current) use of inhaled steroids: Secondary | ICD-10-CM | POA: Diagnosis not present

## 2022-06-09 DIAGNOSIS — E640 Sequelae of protein-calorie malnutrition: Secondary | ICD-10-CM | POA: Diagnosis not present

## 2022-06-09 DIAGNOSIS — Z9981 Dependence on supplemental oxygen: Secondary | ICD-10-CM | POA: Diagnosis not present

## 2022-06-09 DIAGNOSIS — K649 Unspecified hemorrhoids: Secondary | ICD-10-CM | POA: Diagnosis not present

## 2022-06-09 DIAGNOSIS — I10 Essential (primary) hypertension: Secondary | ICD-10-CM | POA: Diagnosis not present

## 2022-06-09 DIAGNOSIS — J9601 Acute respiratory failure with hypoxia: Secondary | ICD-10-CM | POA: Diagnosis not present

## 2022-06-09 DIAGNOSIS — K5904 Chronic idiopathic constipation: Secondary | ICD-10-CM | POA: Diagnosis not present

## 2022-06-09 DIAGNOSIS — G9340 Encephalopathy, unspecified: Secondary | ICD-10-CM | POA: Diagnosis not present

## 2022-06-09 DIAGNOSIS — M1990 Unspecified osteoarthritis, unspecified site: Secondary | ICD-10-CM | POA: Diagnosis not present

## 2022-06-09 DIAGNOSIS — F17210 Nicotine dependence, cigarettes, uncomplicated: Secondary | ICD-10-CM | POA: Diagnosis not present

## 2022-06-09 DIAGNOSIS — J441 Chronic obstructive pulmonary disease with (acute) exacerbation: Secondary | ICD-10-CM | POA: Diagnosis not present

## 2022-06-11 DIAGNOSIS — Z7951 Long term (current) use of inhaled steroids: Secondary | ICD-10-CM | POA: Diagnosis not present

## 2022-06-11 DIAGNOSIS — I700 Atherosclerosis of aorta: Secondary | ICD-10-CM | POA: Diagnosis not present

## 2022-06-11 DIAGNOSIS — I10 Essential (primary) hypertension: Secondary | ICD-10-CM | POA: Diagnosis not present

## 2022-06-11 DIAGNOSIS — F17210 Nicotine dependence, cigarettes, uncomplicated: Secondary | ICD-10-CM | POA: Diagnosis not present

## 2022-06-11 DIAGNOSIS — J9601 Acute respiratory failure with hypoxia: Secondary | ICD-10-CM | POA: Diagnosis not present

## 2022-06-11 DIAGNOSIS — Z9981 Dependence on supplemental oxygen: Secondary | ICD-10-CM | POA: Diagnosis not present

## 2022-06-11 DIAGNOSIS — G9340 Encephalopathy, unspecified: Secondary | ICD-10-CM | POA: Diagnosis not present

## 2022-06-11 DIAGNOSIS — J441 Chronic obstructive pulmonary disease with (acute) exacerbation: Secondary | ICD-10-CM | POA: Diagnosis not present

## 2022-06-11 DIAGNOSIS — K5904 Chronic idiopathic constipation: Secondary | ICD-10-CM | POA: Diagnosis not present

## 2022-06-11 DIAGNOSIS — M549 Dorsalgia, unspecified: Secondary | ICD-10-CM | POA: Diagnosis not present

## 2022-06-11 DIAGNOSIS — Z853 Personal history of malignant neoplasm of breast: Secondary | ICD-10-CM | POA: Diagnosis not present

## 2022-06-11 DIAGNOSIS — E640 Sequelae of protein-calorie malnutrition: Secondary | ICD-10-CM | POA: Diagnosis not present

## 2022-06-11 DIAGNOSIS — K649 Unspecified hemorrhoids: Secondary | ICD-10-CM | POA: Diagnosis not present

## 2022-06-11 DIAGNOSIS — G4089 Other seizures: Secondary | ICD-10-CM | POA: Diagnosis not present

## 2022-06-11 DIAGNOSIS — R32 Unspecified urinary incontinence: Secondary | ICD-10-CM | POA: Diagnosis not present

## 2022-06-11 DIAGNOSIS — I4821 Permanent atrial fibrillation: Secondary | ICD-10-CM | POA: Diagnosis not present

## 2022-06-11 DIAGNOSIS — M1990 Unspecified osteoarthritis, unspecified site: Secondary | ICD-10-CM | POA: Diagnosis not present

## 2022-06-11 DIAGNOSIS — R471 Dysarthria and anarthria: Secondary | ICD-10-CM | POA: Diagnosis not present

## 2022-06-11 DIAGNOSIS — I69354 Hemiplegia and hemiparesis following cerebral infarction affecting left non-dominant side: Secondary | ICD-10-CM | POA: Diagnosis not present

## 2022-06-16 DIAGNOSIS — F17210 Nicotine dependence, cigarettes, uncomplicated: Secondary | ICD-10-CM | POA: Diagnosis not present

## 2022-06-16 DIAGNOSIS — I4821 Permanent atrial fibrillation: Secondary | ICD-10-CM | POA: Diagnosis not present

## 2022-06-16 DIAGNOSIS — G9340 Encephalopathy, unspecified: Secondary | ICD-10-CM | POA: Diagnosis not present

## 2022-06-16 DIAGNOSIS — Z853 Personal history of malignant neoplasm of breast: Secondary | ICD-10-CM | POA: Diagnosis not present

## 2022-06-16 DIAGNOSIS — E640 Sequelae of protein-calorie malnutrition: Secondary | ICD-10-CM | POA: Diagnosis not present

## 2022-06-16 DIAGNOSIS — G4089 Other seizures: Secondary | ICD-10-CM | POA: Diagnosis not present

## 2022-06-16 DIAGNOSIS — I10 Essential (primary) hypertension: Secondary | ICD-10-CM | POA: Diagnosis not present

## 2022-06-16 DIAGNOSIS — J441 Chronic obstructive pulmonary disease with (acute) exacerbation: Secondary | ICD-10-CM | POA: Diagnosis not present

## 2022-06-16 DIAGNOSIS — K649 Unspecified hemorrhoids: Secondary | ICD-10-CM | POA: Diagnosis not present

## 2022-06-16 DIAGNOSIS — I69354 Hemiplegia and hemiparesis following cerebral infarction affecting left non-dominant side: Secondary | ICD-10-CM | POA: Diagnosis not present

## 2022-06-16 DIAGNOSIS — R471 Dysarthria and anarthria: Secondary | ICD-10-CM | POA: Diagnosis not present

## 2022-06-16 DIAGNOSIS — K5904 Chronic idiopathic constipation: Secondary | ICD-10-CM | POA: Diagnosis not present

## 2022-06-16 DIAGNOSIS — M549 Dorsalgia, unspecified: Secondary | ICD-10-CM | POA: Diagnosis not present

## 2022-06-16 DIAGNOSIS — Z9981 Dependence on supplemental oxygen: Secondary | ICD-10-CM | POA: Diagnosis not present

## 2022-06-16 DIAGNOSIS — R32 Unspecified urinary incontinence: Secondary | ICD-10-CM | POA: Diagnosis not present

## 2022-06-16 DIAGNOSIS — Z7951 Long term (current) use of inhaled steroids: Secondary | ICD-10-CM | POA: Diagnosis not present

## 2022-06-16 DIAGNOSIS — J9601 Acute respiratory failure with hypoxia: Secondary | ICD-10-CM | POA: Diagnosis not present

## 2022-06-16 DIAGNOSIS — M1990 Unspecified osteoarthritis, unspecified site: Secondary | ICD-10-CM | POA: Diagnosis not present

## 2022-06-16 DIAGNOSIS — I700 Atherosclerosis of aorta: Secondary | ICD-10-CM | POA: Diagnosis not present

## 2022-06-17 DIAGNOSIS — J9601 Acute respiratory failure with hypoxia: Secondary | ICD-10-CM | POA: Diagnosis not present

## 2022-06-17 DIAGNOSIS — I69354 Hemiplegia and hemiparesis following cerebral infarction affecting left non-dominant side: Secondary | ICD-10-CM | POA: Diagnosis not present

## 2022-06-17 DIAGNOSIS — K649 Unspecified hemorrhoids: Secondary | ICD-10-CM | POA: Diagnosis not present

## 2022-06-17 DIAGNOSIS — R32 Unspecified urinary incontinence: Secondary | ICD-10-CM | POA: Diagnosis not present

## 2022-06-17 DIAGNOSIS — I4821 Permanent atrial fibrillation: Secondary | ICD-10-CM | POA: Diagnosis not present

## 2022-06-17 DIAGNOSIS — R471 Dysarthria and anarthria: Secondary | ICD-10-CM | POA: Diagnosis not present

## 2022-06-17 DIAGNOSIS — I700 Atherosclerosis of aorta: Secondary | ICD-10-CM | POA: Diagnosis not present

## 2022-06-17 DIAGNOSIS — J441 Chronic obstructive pulmonary disease with (acute) exacerbation: Secondary | ICD-10-CM | POA: Diagnosis not present

## 2022-06-17 DIAGNOSIS — M1990 Unspecified osteoarthritis, unspecified site: Secondary | ICD-10-CM | POA: Diagnosis not present

## 2022-06-17 DIAGNOSIS — M549 Dorsalgia, unspecified: Secondary | ICD-10-CM | POA: Diagnosis not present

## 2022-06-17 DIAGNOSIS — Z9981 Dependence on supplemental oxygen: Secondary | ICD-10-CM | POA: Diagnosis not present

## 2022-06-17 DIAGNOSIS — I10 Essential (primary) hypertension: Secondary | ICD-10-CM | POA: Diagnosis not present

## 2022-06-17 DIAGNOSIS — G4089 Other seizures: Secondary | ICD-10-CM | POA: Diagnosis not present

## 2022-06-17 DIAGNOSIS — G9340 Encephalopathy, unspecified: Secondary | ICD-10-CM | POA: Diagnosis not present

## 2022-06-17 DIAGNOSIS — E640 Sequelae of protein-calorie malnutrition: Secondary | ICD-10-CM | POA: Diagnosis not present

## 2022-06-17 DIAGNOSIS — F17210 Nicotine dependence, cigarettes, uncomplicated: Secondary | ICD-10-CM | POA: Diagnosis not present

## 2022-06-17 DIAGNOSIS — Z853 Personal history of malignant neoplasm of breast: Secondary | ICD-10-CM | POA: Diagnosis not present

## 2022-06-17 DIAGNOSIS — K5904 Chronic idiopathic constipation: Secondary | ICD-10-CM | POA: Diagnosis not present

## 2022-06-17 DIAGNOSIS — Z7951 Long term (current) use of inhaled steroids: Secondary | ICD-10-CM | POA: Diagnosis not present

## 2022-06-21 ENCOUNTER — Other Ambulatory Visit: Payer: Self-pay | Admitting: Family Medicine

## 2022-06-21 ENCOUNTER — Other Ambulatory Visit: Payer: Self-pay

## 2022-06-21 DIAGNOSIS — M1990 Unspecified osteoarthritis, unspecified site: Secondary | ICD-10-CM | POA: Diagnosis not present

## 2022-06-21 DIAGNOSIS — Z853 Personal history of malignant neoplasm of breast: Secondary | ICD-10-CM | POA: Diagnosis not present

## 2022-06-21 DIAGNOSIS — G4089 Other seizures: Secondary | ICD-10-CM | POA: Diagnosis not present

## 2022-06-21 DIAGNOSIS — J9601 Acute respiratory failure with hypoxia: Secondary | ICD-10-CM | POA: Diagnosis not present

## 2022-06-21 DIAGNOSIS — Z9981 Dependence on supplemental oxygen: Secondary | ICD-10-CM | POA: Diagnosis not present

## 2022-06-21 DIAGNOSIS — M549 Dorsalgia, unspecified: Secondary | ICD-10-CM | POA: Diagnosis not present

## 2022-06-21 DIAGNOSIS — I10 Essential (primary) hypertension: Secondary | ICD-10-CM | POA: Diagnosis not present

## 2022-06-21 DIAGNOSIS — J432 Centrilobular emphysema: Secondary | ICD-10-CM

## 2022-06-21 DIAGNOSIS — K649 Unspecified hemorrhoids: Secondary | ICD-10-CM | POA: Diagnosis not present

## 2022-06-21 DIAGNOSIS — R32 Unspecified urinary incontinence: Secondary | ICD-10-CM | POA: Diagnosis not present

## 2022-06-21 DIAGNOSIS — K5904 Chronic idiopathic constipation: Secondary | ICD-10-CM | POA: Diagnosis not present

## 2022-06-21 DIAGNOSIS — R471 Dysarthria and anarthria: Secondary | ICD-10-CM | POA: Diagnosis not present

## 2022-06-21 DIAGNOSIS — Z7951 Long term (current) use of inhaled steroids: Secondary | ICD-10-CM | POA: Diagnosis not present

## 2022-06-21 DIAGNOSIS — I700 Atherosclerosis of aorta: Secondary | ICD-10-CM | POA: Diagnosis not present

## 2022-06-21 DIAGNOSIS — I4821 Permanent atrial fibrillation: Secondary | ICD-10-CM | POA: Diagnosis not present

## 2022-06-21 DIAGNOSIS — I69354 Hemiplegia and hemiparesis following cerebral infarction affecting left non-dominant side: Secondary | ICD-10-CM | POA: Diagnosis not present

## 2022-06-21 DIAGNOSIS — J441 Chronic obstructive pulmonary disease with (acute) exacerbation: Secondary | ICD-10-CM | POA: Diagnosis not present

## 2022-06-21 DIAGNOSIS — G9340 Encephalopathy, unspecified: Secondary | ICD-10-CM | POA: Diagnosis not present

## 2022-06-21 DIAGNOSIS — F17210 Nicotine dependence, cigarettes, uncomplicated: Secondary | ICD-10-CM | POA: Diagnosis not present

## 2022-06-21 DIAGNOSIS — E640 Sequelae of protein-calorie malnutrition: Secondary | ICD-10-CM | POA: Diagnosis not present

## 2022-06-21 MED ORDER — ARFORMOTEROL TARTRATE 15 MCG/2ML IN NEBU: 15.0000 ug | INHALATION_SOLUTION | Freq: Two times a day (BID) | RESPIRATORY_TRACT | 0 refills | Status: DC

## 2022-06-22 DIAGNOSIS — F17210 Nicotine dependence, cigarettes, uncomplicated: Secondary | ICD-10-CM | POA: Diagnosis not present

## 2022-06-22 DIAGNOSIS — K649 Unspecified hemorrhoids: Secondary | ICD-10-CM | POA: Diagnosis not present

## 2022-06-22 DIAGNOSIS — R471 Dysarthria and anarthria: Secondary | ICD-10-CM | POA: Diagnosis not present

## 2022-06-22 DIAGNOSIS — E640 Sequelae of protein-calorie malnutrition: Secondary | ICD-10-CM | POA: Diagnosis not present

## 2022-06-22 DIAGNOSIS — G4089 Other seizures: Secondary | ICD-10-CM | POA: Diagnosis not present

## 2022-06-22 DIAGNOSIS — I10 Essential (primary) hypertension: Secondary | ICD-10-CM | POA: Diagnosis not present

## 2022-06-22 DIAGNOSIS — G9340 Encephalopathy, unspecified: Secondary | ICD-10-CM | POA: Diagnosis not present

## 2022-06-22 DIAGNOSIS — R32 Unspecified urinary incontinence: Secondary | ICD-10-CM | POA: Diagnosis not present

## 2022-06-22 DIAGNOSIS — Z7951 Long term (current) use of inhaled steroids: Secondary | ICD-10-CM | POA: Diagnosis not present

## 2022-06-22 DIAGNOSIS — M1990 Unspecified osteoarthritis, unspecified site: Secondary | ICD-10-CM | POA: Diagnosis not present

## 2022-06-22 DIAGNOSIS — I4821 Permanent atrial fibrillation: Secondary | ICD-10-CM | POA: Diagnosis not present

## 2022-06-22 DIAGNOSIS — Z853 Personal history of malignant neoplasm of breast: Secondary | ICD-10-CM | POA: Diagnosis not present

## 2022-06-22 DIAGNOSIS — I69354 Hemiplegia and hemiparesis following cerebral infarction affecting left non-dominant side: Secondary | ICD-10-CM | POA: Diagnosis not present

## 2022-06-22 DIAGNOSIS — Z9981 Dependence on supplemental oxygen: Secondary | ICD-10-CM | POA: Diagnosis not present

## 2022-06-22 DIAGNOSIS — J9601 Acute respiratory failure with hypoxia: Secondary | ICD-10-CM | POA: Diagnosis not present

## 2022-06-22 DIAGNOSIS — I700 Atherosclerosis of aorta: Secondary | ICD-10-CM | POA: Diagnosis not present

## 2022-06-22 DIAGNOSIS — M549 Dorsalgia, unspecified: Secondary | ICD-10-CM | POA: Diagnosis not present

## 2022-06-22 DIAGNOSIS — K5904 Chronic idiopathic constipation: Secondary | ICD-10-CM | POA: Diagnosis not present

## 2022-06-22 DIAGNOSIS — J441 Chronic obstructive pulmonary disease with (acute) exacerbation: Secondary | ICD-10-CM | POA: Diagnosis not present

## 2022-06-22 NOTE — Telephone Encounter (Signed)
Note from pharmacy advised med is not in Formulary and is requesting another med to be prescribed.  Requested Prescriptions  Pending Prescriptions Disp Refills   BROVANA 15 MCG/2ML NEBU [Pharmacy Med Name: BROVANA 15 MCG/2 ML SOLUTION] 120 mL 0    Sig: INHALE 2 MLS (15 MCG TOTAL) INTO THE LUNGS 2 (TWO) TIMES DAILY.     Pulmonology:  Beta Agonists Passed - 06/21/2022 10:38 AM      Passed - Valid encounter within last 12 months    Recent Outpatient Visits           1 month ago Centrilobular emphysema ([place])   [place] [facility] [place], Deanna C, MD   2 years ago Centrilobular emphysema [facility])   [place] [facility] [place], Deanna C, MD   2 years ago Centrilobular emphysema [facility])   [place] [facility] [place], Deanna C, MD   3 years ago Pain of upper abdomen    [facility] at [place], Deanna C, MD   4 years ago Weight loss   [place] [facility] at [facility] Fabian, MD

## 2022-06-23 DIAGNOSIS — K649 Unspecified hemorrhoids: Secondary | ICD-10-CM | POA: Diagnosis not present

## 2022-06-23 DIAGNOSIS — E640 Sequelae of protein-calorie malnutrition: Secondary | ICD-10-CM | POA: Diagnosis not present

## 2022-06-23 DIAGNOSIS — R471 Dysarthria and anarthria: Secondary | ICD-10-CM | POA: Diagnosis not present

## 2022-06-23 DIAGNOSIS — Z853 Personal history of malignant neoplasm of breast: Secondary | ICD-10-CM | POA: Diagnosis not present

## 2022-06-23 DIAGNOSIS — K5904 Chronic idiopathic constipation: Secondary | ICD-10-CM | POA: Diagnosis not present

## 2022-06-23 DIAGNOSIS — I10 Essential (primary) hypertension: Secondary | ICD-10-CM | POA: Diagnosis not present

## 2022-06-23 DIAGNOSIS — M1990 Unspecified osteoarthritis, unspecified site: Secondary | ICD-10-CM | POA: Diagnosis not present

## 2022-06-23 DIAGNOSIS — Z7951 Long term (current) use of inhaled steroids: Secondary | ICD-10-CM | POA: Diagnosis not present

## 2022-06-23 DIAGNOSIS — R32 Unspecified urinary incontinence: Secondary | ICD-10-CM | POA: Diagnosis not present

## 2022-06-23 DIAGNOSIS — J441 Chronic obstructive pulmonary disease with (acute) exacerbation: Secondary | ICD-10-CM | POA: Diagnosis not present

## 2022-06-23 DIAGNOSIS — G9340 Encephalopathy, unspecified: Secondary | ICD-10-CM | POA: Diagnosis not present

## 2022-06-23 DIAGNOSIS — I700 Atherosclerosis of aorta: Secondary | ICD-10-CM | POA: Diagnosis not present

## 2022-06-23 DIAGNOSIS — M549 Dorsalgia, unspecified: Secondary | ICD-10-CM | POA: Diagnosis not present

## 2022-06-23 DIAGNOSIS — I69354 Hemiplegia and hemiparesis following cerebral infarction affecting left non-dominant side: Secondary | ICD-10-CM | POA: Diagnosis not present

## 2022-06-23 DIAGNOSIS — Z9981 Dependence on supplemental oxygen: Secondary | ICD-10-CM | POA: Diagnosis not present

## 2022-06-23 DIAGNOSIS — F17210 Nicotine dependence, cigarettes, uncomplicated: Secondary | ICD-10-CM | POA: Diagnosis not present

## 2022-06-23 DIAGNOSIS — I4821 Permanent atrial fibrillation: Secondary | ICD-10-CM | POA: Diagnosis not present

## 2022-06-23 DIAGNOSIS — J9601 Acute respiratory failure with hypoxia: Secondary | ICD-10-CM | POA: Diagnosis not present

## 2022-06-23 DIAGNOSIS — G4089 Other seizures: Secondary | ICD-10-CM | POA: Diagnosis not present

## 2022-06-27 ENCOUNTER — Inpatient Hospital Stay
Admission: EM | Admit: 2022-06-27 | Discharge: 2022-06-30 | DRG: 562 | Disposition: A | Discharge status: Hospice | Payer: PPO | Attending: Internal Medicine | Admitting: Internal Medicine

## 2022-06-27 ENCOUNTER — Emergency Department: Payer: PPO

## 2022-06-27 ENCOUNTER — Other Ambulatory Visit: Payer: Self-pay

## 2022-06-27 DIAGNOSIS — Z9981 Dependence on supplemental oxygen: Secondary | ICD-10-CM | POA: Diagnosis not present

## 2022-06-27 DIAGNOSIS — M4802 Spinal stenosis, cervical region: Secondary | ICD-10-CM | POA: Diagnosis not present

## 2022-06-27 DIAGNOSIS — Z91013 Allergy to seafood: Secondary | ICD-10-CM

## 2022-06-27 DIAGNOSIS — Z923 Personal history of irradiation: Secondary | ICD-10-CM | POA: Diagnosis not present

## 2022-06-27 DIAGNOSIS — N390 Urinary tract infection, site not specified: Secondary | ICD-10-CM | POA: Diagnosis present

## 2022-06-27 DIAGNOSIS — Z66 Do not resuscitate: Secondary | ICD-10-CM | POA: Diagnosis not present

## 2022-06-27 DIAGNOSIS — F445 Conversion disorder with seizures or convulsions: Secondary | ICD-10-CM

## 2022-06-27 DIAGNOSIS — W19XXXA Unspecified fall, initial encounter: Secondary | ICD-10-CM | POA: Diagnosis present

## 2022-06-27 DIAGNOSIS — I25119 Atherosclerotic heart disease of native coronary artery with unspecified angina pectoris: Secondary | ICD-10-CM | POA: Diagnosis present

## 2022-06-27 DIAGNOSIS — Z853 Personal history of malignant neoplasm of breast: Secondary | ICD-10-CM | POA: Diagnosis not present

## 2022-06-27 DIAGNOSIS — N179 Acute kidney failure, unspecified: Secondary | ICD-10-CM | POA: Diagnosis present

## 2022-06-27 DIAGNOSIS — G40909 Epilepsy, unspecified, not intractable, without status epilepticus: Secondary | ICD-10-CM

## 2022-06-27 DIAGNOSIS — S42291A Other displaced fracture of upper end of right humerus, initial encounter for closed fracture: Secondary | ICD-10-CM | POA: Diagnosis present

## 2022-06-27 DIAGNOSIS — Z1152 Encounter for screening for COVID-19: Secondary | ICD-10-CM

## 2022-06-27 DIAGNOSIS — Z681 Body mass index (BMI) 19 or less, adult: Secondary | ICD-10-CM | POA: Diagnosis not present

## 2022-06-27 DIAGNOSIS — E43 Unspecified severe protein-calorie malnutrition: Secondary | ICD-10-CM | POA: Diagnosis not present

## 2022-06-27 DIAGNOSIS — Z880 Allergy status to penicillin: Secondary | ICD-10-CM | POA: Diagnosis not present

## 2022-06-27 DIAGNOSIS — Z87828 Personal history of other (healed) physical injury and trauma: Secondary | ICD-10-CM | POA: Diagnosis not present

## 2022-06-27 DIAGNOSIS — N1831 Chronic kidney disease, stage 3a: Secondary | ICD-10-CM | POA: Diagnosis present

## 2022-06-27 DIAGNOSIS — W1830XA Fall on same level, unspecified, initial encounter: Secondary | ICD-10-CM | POA: Diagnosis present

## 2022-06-27 DIAGNOSIS — R569 Unspecified convulsions: Secondary | ICD-10-CM

## 2022-06-27 DIAGNOSIS — F0390 Unspecified dementia without behavioral disturbance: Secondary | ICD-10-CM | POA: Diagnosis not present

## 2022-06-27 DIAGNOSIS — I700 Atherosclerosis of aorta: Secondary | ICD-10-CM | POA: Diagnosis not present

## 2022-06-27 DIAGNOSIS — E860 Dehydration: Secondary | ICD-10-CM | POA: Diagnosis present

## 2022-06-27 DIAGNOSIS — Z87891 Personal history of nicotine dependence: Secondary | ICD-10-CM

## 2022-06-27 DIAGNOSIS — R531 Weakness: Secondary | ICD-10-CM

## 2022-06-27 DIAGNOSIS — J439 Emphysema, unspecified: Secondary | ICD-10-CM | POA: Diagnosis not present

## 2022-06-27 DIAGNOSIS — N3000 Acute cystitis without hematuria: Secondary | ICD-10-CM | POA: Diagnosis not present

## 2022-06-27 DIAGNOSIS — J9601 Acute respiratory failure with hypoxia: Secondary | ICD-10-CM | POA: Diagnosis not present

## 2022-06-27 DIAGNOSIS — S42251A Displaced fracture of greater tuberosity of right humerus, initial encounter for closed fracture: Secondary | ICD-10-CM | POA: Diagnosis not present

## 2022-06-27 DIAGNOSIS — M47812 Spondylosis without myelopathy or radiculopathy, cervical region: Secondary | ICD-10-CM | POA: Diagnosis not present

## 2022-06-27 DIAGNOSIS — J9611 Chronic respiratory failure with hypoxia: Secondary | ICD-10-CM

## 2022-06-27 DIAGNOSIS — I701 Atherosclerosis of renal artery: Secondary | ICD-10-CM | POA: Diagnosis not present

## 2022-06-27 DIAGNOSIS — S42301A Unspecified fracture of shaft of humerus, right arm, initial encounter for closed fracture: Secondary | ICD-10-CM

## 2022-06-27 DIAGNOSIS — J432 Centrilobular emphysema: Secondary | ICD-10-CM | POA: Diagnosis present

## 2022-06-27 DIAGNOSIS — S199XXA Unspecified injury of neck, initial encounter: Secondary | ICD-10-CM | POA: Diagnosis not present

## 2022-06-27 DIAGNOSIS — S42211A Unspecified displaced fracture of surgical neck of right humerus, initial encounter for closed fracture: Principal | ICD-10-CM | POA: Diagnosis present

## 2022-06-27 DIAGNOSIS — S0990XA Unspecified injury of head, initial encounter: Secondary | ICD-10-CM | POA: Diagnosis not present

## 2022-06-27 DIAGNOSIS — Y92009 Unspecified place in unspecified non-institutional (private) residence as the place of occurrence of the external cause: Secondary | ICD-10-CM | POA: Diagnosis not present

## 2022-06-27 HISTORY — DX: Unspecified convulsions: R56.9

## 2022-06-27 HISTORY — DX: Chronic respiratory failure with hypoxia: J96.11

## 2022-06-27 LAB — CBC WITH DIFFERENTIAL/PLATELET
Abs Immature Granulocytes: 0.03 10*3/uL (ref 0.00–0.07)
Basophils Absolute: 0 10*3/uL (ref 0.0–0.1)
Basophils Relative: 0 %
Eosinophils Absolute: 0 10*3/uL (ref 0.0–0.5)
Eosinophils Relative: 0 %
HCT: 31.6 % — ABNORMAL LOW (ref 36.0–46.0)
Hemoglobin: 9.6 g/dL — ABNORMAL LOW (ref 12.0–15.0)
Immature Granulocytes: 0 %
Lymphocytes Relative: 5 %
Lymphs Abs: 0.5 10*3/uL — ABNORMAL LOW (ref 0.7–4.0)
MCH: 30.6 pg (ref 26.0–34.0)
MCHC: 30.4 g/dL (ref 30.0–36.0)
MCV: 100.6 fL — ABNORMAL HIGH (ref 80.0–100.0)
Monocytes Absolute: 0.7 10*3/uL (ref 0.1–1.0)
Monocytes Relative: 7 %
Neutro Abs: 8.8 10*3/uL — ABNORMAL HIGH (ref 1.7–7.7)
Neutrophils Relative %: 88 %
Platelets: 213 10*3/uL (ref 150–400)
RBC: 3.14 MIL/uL — ABNORMAL LOW (ref 3.87–5.11)
RDW: 12.9 % (ref 11.5–15.5)
WBC: 10 10*3/uL (ref 4.0–10.5)
nRBC: 0 % (ref 0.0–0.2)

## 2022-06-27 LAB — COMPREHENSIVE METABOLIC PANEL
ALT: 19 U/L (ref 0–44)
AST: 28 U/L (ref 15–41)
Albumin: 4 g/dL (ref 3.5–5.0)
Alkaline Phosphatase: 101 U/L (ref 38–126)
Anion gap: 7 (ref 5–15)
BUN: 34 mg/dL — ABNORMAL HIGH (ref 8–23)
CO2: 37 mmol/L — ABNORMAL HIGH (ref 22–32)
Calcium: 9.4 mg/dL (ref 8.9–10.3)
Chloride: 96 mmol/L — ABNORMAL LOW (ref 98–111)
Creatinine, Ser: 1.11 mg/dL — ABNORMAL HIGH (ref 0.44–1.00)
GFR, Estimated: 51 mL/min — ABNORMAL LOW (ref >60)
Glucose, Bld: 116 mg/dL — ABNORMAL HIGH (ref 70–99)
Potassium: 4.7 mmol/L (ref 3.5–5.1)
Sodium: 140 mmol/L (ref 135–145)
Total Bilirubin: 1 mg/dL (ref 0.3–1.2)
Total Protein: 7.4 g/dL (ref 6.5–8.1)

## 2022-06-27 LAB — URINALYSIS, ROUTINE W REFLEX MICROSCOPIC
Bilirubin Urine: NEGATIVE
Glucose, UA: NEGATIVE mg/dL
Hgb urine dipstick: NEGATIVE
Ketones, ur: NEGATIVE mg/dL
Nitrite: NEGATIVE
Protein, ur: 100 mg/dL — AB
Specific Gravity, Urine: 1.016 (ref 1.005–1.030)
pH: 6 (ref 5.0–8.0)

## 2022-06-27 LAB — TROPONIN I (HIGH SENSITIVITY): Troponin I (High Sensitivity): 37 ng/L — ABNORMAL HIGH (ref <18)

## 2022-06-27 LAB — SARS CORONAVIRUS 2 BY RT PCR: SARS Coronavirus 2 by RT PCR: NEGATIVE

## 2022-06-27 LAB — LACTIC ACID, PLASMA
Lactic Acid, Venous: 0.8 mmol/L (ref 0.5–1.9)
Lactic Acid, Venous: 1.2 mmol/L (ref 0.5–1.9)

## 2022-06-27 LAB — BRAIN NATRIURETIC PEPTIDE: B Natriuretic Peptide: 352.6 pg/mL — ABNORMAL HIGH (ref 0.0–100.0)

## 2022-06-27 LAB — MAGNESIUM: Magnesium: 2.2 mg/dL (ref 1.7–2.4)

## 2022-06-27 LAB — CK: Total CK: 100 U/L (ref 38–234)

## 2022-06-27 MED ORDER — HEPARIN SODIUM (PORCINE) 5000 UNIT/ML IJ SOLN
5000.0000 [IU] | Freq: Three times a day (TID) | INTRAMUSCULAR | Status: DC
  Administered 2022-06-27 – 2022-06-30 (×9): 5000 [IU] via SUBCUTANEOUS
  Filled 2022-06-27 (×9): qty 1

## 2022-06-27 MED ORDER — ACETAMINOPHEN 325 MG PO TABS: 325.0000 mg | ORAL_TABLET | Freq: Four times a day (QID) | ORAL | Status: DC | PRN

## 2022-06-27 MED ORDER — ALBUTEROL SULFATE HFA 108 (90 BASE) MCG/ACT IN AERS: 1.0000 | INHALATION_SPRAY | RESPIRATORY_TRACT | Status: DC | PRN

## 2022-06-27 MED ORDER — FAMOTIDINE 20 MG PO TABS
20.0000 mg | ORAL_TABLET | Freq: Two times a day (BID) | ORAL | Status: DC
  Administered 2022-06-27 – 2022-06-30 (×6): 20 mg via ORAL
  Filled 2022-06-27 (×6): qty 1

## 2022-06-27 MED ORDER — VITAMIN B-6 100 MG PO TABS
200.0000 mg | ORAL_TABLET | Freq: Every day | ORAL | Status: DC
  Administered 2022-06-28 – 2022-06-30 (×3): 200 mg via ORAL
  Filled 2022-06-27 (×3): qty 2

## 2022-06-27 MED ORDER — ACETAMINOPHEN 325 MG PO TABS
650.0000 mg | ORAL_TABLET | Freq: Once | ORAL | Status: AC
  Administered 2022-06-27: 650 mg via ORAL
  Filled 2022-06-27: qty 2

## 2022-06-27 MED ORDER — EZETIMIBE 10 MG PO TABS
10.0000 mg | ORAL_TABLET | Freq: Every day | ORAL | Status: DC
  Administered 2022-06-28 – 2022-06-30 (×3): 10 mg via ORAL
  Filled 2022-06-27 (×3): qty 1

## 2022-06-27 MED ORDER — SODIUM CHLORIDE 0.9 % IV SOLN
1.0000 g | Freq: Once | INTRAVENOUS | Status: AC
  Administered 2022-06-27: 1 g via INTRAVENOUS
  Filled 2022-06-27: qty 10

## 2022-06-27 MED ORDER — ACETAMINOPHEN 325 MG PO TABS
650.0000 mg | ORAL_TABLET | Freq: Four times a day (QID) | ORAL | Status: DC | PRN
  Administered 2022-06-28 – 2022-06-30 (×3): 650 mg via ORAL
  Filled 2022-06-27 (×3): qty 2

## 2022-06-27 MED ORDER — LEVETIRACETAM 750 MG PO TABS
750.0000 mg | ORAL_TABLET | Freq: Two times a day (BID) | ORAL | Status: DC
  Administered 2022-06-27 – 2022-06-30 (×6): 750 mg via ORAL
  Filled 2022-06-27 (×7): qty 1

## 2022-06-27 MED ORDER — IPRATROPIUM-ALBUTEROL 20-100 MCG/ACT IN AERS: 1.0000 | INHALATION_SPRAY | Freq: Four times a day (QID) | RESPIRATORY_TRACT | Status: DC

## 2022-06-27 MED ORDER — IPRATROPIUM-ALBUTEROL 0.5-2.5 (3) MG/3ML IN SOLN: 3.0000 mL | Freq: Four times a day (QID) | RESPIRATORY_TRACT | Status: DC | PRN

## 2022-06-27 MED ORDER — SODIUM CHLORIDE 0.9 % IV SOLN
1.0000 g | INTRAVENOUS | Status: DC
  Administered 2022-06-28 – 2022-06-29 (×2): 1 g via INTRAVENOUS
  Filled 2022-06-27: qty 10
  Filled 2022-06-27 (×2): qty 1

## 2022-06-27 MED ORDER — FLUTICASONE FUROATE-VILANTEROL 100-25 MCG/ACT IN AEPB
1.0000 | INHALATION_SPRAY | Freq: Every day | RESPIRATORY_TRACT | Status: DC
  Administered 2022-06-28 – 2022-06-30 (×3): 1 via RESPIRATORY_TRACT
  Filled 2022-06-27: qty 28

## 2022-06-27 MED ORDER — POLYVINYL ALCOHOL 1.4 % OP SOLN: 1.0000 [drp] | Freq: Two times a day (BID) | OPHTHALMIC | Status: DC | PRN

## 2022-06-27 MED ORDER — BUDESONIDE 0.5 MG/2ML IN SUSP
0.5000 mg | Freq: Two times a day (BID) | RESPIRATORY_TRACT | Status: DC
  Administered 2022-06-27 – 2022-06-30 (×6): 0.5 mg via RESPIRATORY_TRACT
  Filled 2022-06-27 (×6): qty 2

## 2022-06-27 MED ORDER — SODIUM CHLORIDE 0.9 % IV SOLN: INTRAVENOUS | Status: DC

## 2022-06-27 MED ORDER — ARFORMOTEROL TARTRATE 15 MCG/2ML IN NEBU
15.0000 ug | INHALATION_SOLUTION | Freq: Two times a day (BID) | RESPIRATORY_TRACT | Status: DC
  Administered 2022-06-28 – 2022-06-30 (×5): 15 ug via RESPIRATORY_TRACT
  Filled 2022-06-27 (×6): qty 2

## 2022-06-27 MED ORDER — ASPIRIN 81 MG PO TBEC
81.0000 mg | DELAYED_RELEASE_TABLET | ORAL | Status: DC
  Administered 2022-06-29: 81 mg via ORAL
  Filled 2022-06-27: qty 1

## 2022-06-27 MED ORDER — LACTATED RINGERS IV SOLN: INTRAVENOUS | Status: DC

## 2022-06-27 MED ORDER — IPRATROPIUM-ALBUTEROL 0.5-2.5 (3) MG/3ML IN SOLN
3.0000 mL | Freq: Once | RESPIRATORY_TRACT | Status: AC
  Administered 2022-06-27: 3 mL via RESPIRATORY_TRACT
  Filled 2022-06-27: qty 3

## 2022-06-27 MED ORDER — ACETAMINOPHEN 650 MG RE SUPP: 650.0000 mg | Freq: Four times a day (QID) | RECTAL | Status: DC | PRN

## 2022-06-27 NOTE — Assessment & Plan Note (Signed)
Patient has a history of emphysema and history of tobacco abuse.  Patient was last seen by pulmonologist in 2019. Patient is currently on home O2 at 4 L nasal cannula respiratory failure and hypoxia. Patient also has an upcoming pulmonology appointment. Currently patient is not baseline O2 4 L. We will continue her home regimen with Breo Ellipta, profile and, Pulmicort, Combivent, DuoNeb, as needed ProAir.

## 2022-06-27 NOTE — ED Notes (Signed)
Pt placed on purewick 

## 2022-06-27 NOTE — ED Notes (Signed)
Patient transported to X-ray 

## 2022-06-27 NOTE — Assessment & Plan Note (Signed)
Urinalysis today shows small leukocytes with 11-20 WBCs and has been started on Rocephin in the ED. We will continue and follow cultures. Urinalysis    Component Value Date/Time   COLORURINE YELLOW (A) 06/27/2022 1707   APPEARANCEUR CLEAR (A) 06/27/2022 1707   LABSPEC 1.016 06/27/2022 1707   PHURINE 6.0 06/27/2022 1707   GLUCOSEU NEGATIVE 06/27/2022 1707   HGBUR NEGATIVE 06/27/2022 1707   BILIRUBINUR NEGATIVE 06/27/2022 1707   BILIRUBINUR negative 05/21/2019 1502   KETONESUR NEGATIVE 06/27/2022 1707   PROTEINUR 100 (A) 06/27/2022 1707   UROBILINOGEN 0.2 05/21/2019 1502   NITRITE NEGATIVE 06/27/2022 1707   LEUKOCYTESUR SMALL (A) 06/27/2022 1707

## 2022-06-27 NOTE — Assessment & Plan Note (Signed)
Patient has acute kidney injury with a creatinine of 1.11 with a EGFR 51 which is new. See trend below. Lab Results  Component Value Date   CREATININE 1.11 (H) 06/27/2022   CREATININE 0.82 05/21/2019   CREATININE 0.96 09/02/2018  Suspect this is from UTI and dehydration and diuretic therapy combination. We will currently hold patient's home regimen with Lasix. We will renally dose all needed medications and avoid contrast and unless necessary. Patient initially given IV fluids in the emergency room, and I will cut down the rate to [facility] due to patient's frail state.  Also because currently holding Lasix.  Intake/Output Summary (Last 24 hours) at 06/27/2022 1954 Last data filed at 06/27/2022 1950 Gross per 24 hour  Intake 278.33 ml  Output 150 ml  Net 128.33 ml   Monitor I's and O's.  Pure wick in place.

## 2022-06-27 NOTE — H&P (Signed)
History and Physical    Chief Complaint: Fall    HISTORY OF PRESENT ILLNESS: Gabriella Wiggins is an 81 y.o. female  seen for rt shoulder pain from a fall right-sided.  Patient states that she was using her rollator, walking down the hallway when she is out she is not able to describe any other symptoms or prodrome or dizziness states that when she fell her husband came to help her.  Patient is a frail severely malnourished lives at home with her husband helps her.  Daughter lives  only a few blocks away.  Patient fell on Friday did not come in because she did not have any pain.  Daughter is at bedside.   Pt has PMH as below  Past Medical History:  Diagnosis Date   Acute respiratory failure with hypoxia ([place]) 08/31/2018   Aortic atherosclerosis ([place])    a. 08/2018 noted on CT   Atherosclerosis of aorta ([place]) 06/06/2018   Breast cancer ([place]) 2004   left lumpectomy   Cachexia ([place])    Chronic respiratory failure with hypoxia, on home O2 therapy (HCC)    COPD (chronic obstructive pulmonary disease) (HCC)    Coronary artery calcification seen on CT scan    a. 08/2018 seen on CT.   Generalized edema 10/24/2019   Genetic testing 06/08/2017   Hematuria, gross 07/24/2015   Personal history of radiation therapy    PMB (postmenopausal bleeding) 06/27/2017   Post-menopause bleeding    Right renal artery stenosis ([place])    a. 08/2018 CTA Abd/Lower ext: Moderate R RA stenosis @ origin.   Seizures ([place])    Tobacco abuse    a. at least 33 y pack history.    Review Of Systems: Review of Systems  Constitutional:  Positive for fatigue.  Neurological:  Positive for weakness.  All other systems reviewed and are negative.    ALLERGIES: Allergies  Allergen Reactions   Penicillin G Other (See Comments)   Penicillins Hives    Has patient had a PCN reaction causing immediate rash, facial/tongue/throat swelling, SOB or lightheadedness with hypotension: No Has patient had a PCN reaction causing  severe rash involving mucus membranes or skin necrosis: No Has patient had a PCN reaction that required hospitalization: No Has patient had a PCN reaction occurring within the last 10 years: No If all of the above answers are "NO", then may proceed with Cephalosporin use.    Shrimp [Shellfish Allergy] Swelling    PAST SURGICAL HISTORY: Past Surgical History:  Procedure Laterality Date   APPENDECTOMY     bilateral tubal     BREAST BIOPSY Right    milk duct removed -neg   BREAST BIOPSY Left 2004   lumpectomy/rad   BREAST LUMPECTOMY Left    DILATATION & CURETTAGE/HYSTEROSCOPY WITH MYOSURE N/A 06/20/2017   Procedure: DILATATION & CURETTAGE/HYSTEROSCOPY WITH MYOSURE;  Surgeon: Schermerhorn, Gwen Her, MD;  Location: [facility] ORS;  Service: Gynecology;  Laterality: N/A;   HYSTEROSCOPY WITH D & C N/A 06/20/2017   Procedure: DILATATION AND CURETTAGE /HYSTEROSCOPY;  Surgeon: Schermerhorn, Gwen Her, MD;  Location: [facility] OR[facility];  Service: Gynecology;  Laterality: N/A;   KYPHOPLASTY N/A 05/29/2019   Procedure: T11 KYPHOPLASTY;  Surgeon: Hessie Knows, MD;  Location: [facility] OR[facility];  Service: Orthopedics;  Laterality: N/A;   OVARIAN CYST SURGERY       SOCIAL HISTORY: Social History   Socioeconomic History   Marital status: Married    Spouse name: Not on file   Number of children: 2  Years of education: Not on file   Highest education level: Not on file  Occupational History   Occupation: retired  Tobacco Use   Smoking status: Former    Packs/day: 1.00    Years: 55.00    Total pack years: 55.00    Types: Cigarettes    Quit date: 08/27/2018    Years since quitting: 3.8   Smokeless tobacco: Never  Vaping Use   Vaping Use: Never used  Substance and Sexual Activity   Alcohol use: No    Alcohol/week: 0.0 standard drinks of alcohol   Drug use: No   Sexual activity: Never  Other Topics Concern   Not on file  Social History Narrative   Lives in [place] with her husband.  Does not routinely  exercise.   Social Determinants of Health   Financial Resource Strain: Low Risk  (02/18/2020)   Overall Financial Resource Strain (CARDIA)    Difficulty of Paying Living Expenses: Not hard at all  Food Insecurity: No Food Insecurity (02/18/2020)   Hunger Vital Sign    Worried About Running Out of Food in the Last Year: Never true    Ran Out of Food in the Last Year: Never true  Transportation Needs: No Transportation Needs (02/18/2020)   PRAPARE - Hydrologist (Medical): No    Lack of Transportation (Non-Medical): No  Physical Activity: Inactive (02/18/2020)   Exercise Vital Sign    Days of Exercise per Week: 0 days    Minutes of Exercise per Session: 0 min  Stress: No Stress Concern Present (02/18/2020)   [place]    Feeling of Stress : Not at all  Social Connections: [place] (02/18/2020)   Social Connection and Isolation Panel [NHANES]    Frequency of Communication with Friends and Family: More than three times a week    Frequency of Social Gatherings with Friends and Family: Three times a week    Attends Religious Services: 1 to 4 times per year    Active Member of Clubs or Organizations: Yes    Attends Music therapist: More than 4 times per year    Marital Status: Married      CURRENT MEDS:    Current Facility-Administered Medications (Cardiovascular):    [START ON 06/28/2022] ezetimibe (ZETIA) tablet 10 mg   Current Facility-Administered Medications (Respiratory):    [START ON 06/28/2022] arformoterol (BROVANA) nebulizer solution 15 mcg   budesonide (PULMICORT) nebulizer solution 0.5 mg   fluticasone furoate-vilanterol (BREO ELLIPTA) 100-25 MCG/ACT 1 puff   ipratropium-albuterol (DUONEB) 0.5-2.5 (3) MG/3ML nebulizer solution 3 mL   Current Facility-Administered Medications (Analgesics):    acetaminophen (TYLENOL) tablet 650 mg **OR** acetaminophen  (TYLENOL) suppository 650 mg   aspirin EC tablet 81 mg   Current Facility-Administered Medications (Hematological):    heparin injection 5,000 Units   Current Facility-Administered Medications (Other):    0.9 %  sodium chloride infusion   [START ON 06/28/2022] cefTRIAXone (ROCEPHIN) 1 g in sodium chloride 0.9 % 100 mL IVPB   famotidine (PEPCID) tablet 20 mg   levETIRAcetam (KEPPRA) tablet 750 mg   polyvinyl alcohol (LIQUIFILM TEARS) 1.4 % ophthalmic solution 1 drop   [START ON 06/28/2022] pyridOXINE (VITAMIN B6) tablet 200 mg  No current outpatient medications on file.    ED Course: Pt in Ed patient is frail-appearing, on nasal cannula no distress. Vitals:   06/27/22 1700 06/27/22 1830 06/27/22 1929 06/27/22 1958  BP: Marland Kitchen)  160/73 138/82 (!) 142/61 130/82  Pulse: (!) 109 (!) 105 (!) 103 (!) 101  Resp:  (!) 25 (!) 24 20  Temp:   98.1 F (36.7 C) 99 F (37.2 C)  TempSrc:   Oral Oral  SpO2: 100% 100% 99% 98%  Weight:      Height:       Total I/O In: 278.3 [I.V.:178.3; IV Piggyback:100] Out: 150 [Urine:150] SpO2: 98 % O2 Flow Rate (L/min): 4 L/min Blood work in ed shows anemia with a hemoglobin of 9.6, AKI with a creatinine of 1.11, urinalysis shows 11-20 WBCs, COVID-negative, lactic negative, CK normal. Results for orders placed or performed during the hospital encounter of 06/27/22 (from the past 24 hour(s))  Magnesium     Status: None   Collection Time: 06/27/22  5:06 PM  Result Value Ref Range   Magnesium 2.2 1.7 - 2.4 mg/dL  CBC with Differential     Status: Abnormal   Collection Time: 06/27/22  5:07 PM  Result Value Ref Range   WBC 10.0 4.0 - 10.5 K/uL   RBC 3.14 (L) 3.87 - 5.11 MIL/uL   Hemoglobin 9.6 (L) 12.0 - 15.0 g/dL   HCT 31.6 (L) 36.0 - 46.0 %   MCV 100.6 (H) 80.0 - 100.0 fL   MCH 30.6 26.0 - 34.0 pg   MCHC 30.4 30.0 - 36.0 g/dL   RDW 12.9 11.5 - 15.5 %   Platelets 213 150 - 400 K/uL   nRBC 0.0 0.0 - 0.2 %   Neutrophils Relative % 88 %   Neutro Abs 8.8  (H) 1.7 - 7.7 K/uL   Lymphocytes Relative 5 %   Lymphs Abs 0.5 (L) 0.7 - 4.0 K/uL   Monocytes Relative 7 %   Monocytes Absolute 0.7 0.1 - 1.0 K/uL   Eosinophils Relative 0 %   Eosinophils Absolute 0.0 0.0 - 0.5 K/uL   Basophils Relative 0 %   Basophils Absolute 0.0 0.0 - 0.1 K/uL   Immature Granulocytes 0 %   Abs Immature Granulocytes 0.03 0.00 - 0.07 K/uL  Comprehensive metabolic panel     Status: Abnormal   Collection Time: 06/27/22  5:07 PM  Result Value Ref Range   Sodium 140 135 - 145 mmol/L   Potassium 4.7 3.5 - 5.1 mmol/L   Chloride 96 (L) 98 - 111 mmol/L   CO2 37 (H) 22 - 32 mmol/L   Glucose, Bld 116 (H) 70 - 99 mg/dL   BUN 34 (H) 8 - 23 mg/dL   Creatinine, Ser 1.11 (H) 0.44 - 1.00 mg/dL   Calcium 9.4 8.9 - 10.3 mg/dL   Total Protein 7.4 6.5 - 8.1 g/dL   Albumin 4.0 3.5 - 5.0 g/dL   AST 28 15 - 41 U/L   ALT 19 0 - 44 U/L   Alkaline Phosphatase 101 38 - 126 U/L   Total Bilirubin 1.0 0.3 - 1.2 mg/dL   GFR, Estimated 51 (L) >60 mL/min   Anion gap 7 5 - 15  Urinalysis, Routine w reflex microscopic Urine, In & Out Cath     Status: Abnormal   Collection Time: 06/27/22  5:07 PM  Result Value Ref Range   Color, Urine YELLOW (A) YELLOW   APPearance CLEAR (A) CLEAR   Specific Gravity, Urine 1.016 1.005 - 1.030   pH 6.0 5.0 - 8.0   Glucose, UA NEGATIVE NEGATIVE mg/dL   Hgb urine dipstick NEGATIVE NEGATIVE   Bilirubin Urine NEGATIVE NEGATIVE   Ketones, ur NEGATIVE NEGATIVE  mg/dL   Protein, ur 100 (A) NEGATIVE mg/dL   Nitrite NEGATIVE NEGATIVE   Leukocytes,Ua SMALL (A) NEGATIVE   RBC / HPF 0-5 0 - 5 RBC/hpf   WBC, UA 11-20 0 - 5 WBC/hpf   Bacteria, UA RARE (A) NONE SEEN   Squamous Epithelial / LPF 0-5 0 - 5   Hyaline Casts, UA PRESENT   Lactic acid, plasma     Status: None   Collection Time: 06/27/22  5:07 PM  Result Value Ref Range   Lactic Acid, Venous 0.8 0.5 - 1.9 mmol/L  Troponin I (High Sensitivity)     Status: Abnormal   Collection Time: 06/27/22  5:07 PM   Result Value Ref Range   Troponin I (High Sensitivity) 37 (H) <18 ng/L  CK     Status: None   Collection Time: 06/27/22  5:07 PM  Result Value Ref Range   Total CK 100 38 - 234 U/L  Brain natriuretic peptide     Status: Abnormal   Collection Time: 06/27/22  5:07 PM  Result Value Ref Range   B Natriuretic Peptide 352.6 (H) 0.0 - 100.0 pg/mL  SARS Coronavirus 2 by RT PCR (hospital order, performed in [facility] hospital lab) *cepheid single result test* Anterior Nasal Swab     Status: None   Collection Time: 06/27/22  6:18 PM   Specimen: Anterior Nasal Swab  Result Value Ref Range   SARS Coronavirus 2 by RT PCR NEGATIVE NEGATIVE   In Ed pt received nebulizers, Tylenol, antibiotics for UTI. Meds ordered this encounter  Medications   acetaminophen (TYLENOL) tablet 650 mg   ipratropium-albuterol (DUONEB) 0.5-2.5 (3) MG/3ML nebulizer solution 3 mL   0.9 %  sodium chloride infusion   cefTRIAXone (ROCEPHIN) 1 g in sodium chloride 0.9 % 100 mL IVPB    Order Specific Question:   Antibiotic Indication:    Answer:   UTI   cefTRIAXone (ROCEPHIN) 1 g in sodium chloride 0.9 % 100 mL IVPB    Order Specific Question:   Antibiotic Indication:    Answer:   UTI   aspirin EC tablet 81 mg   fluticasone furoate-vilanterol (BREO ELLIPTA) 100-25 MCG/ACT 1 puff   DISCONTD: acetaminophen (TYLENOL) tablet 325 mg   budesonide (PULMICORT) nebulizer solution 0.5 mg   arformoterol (BROVANA) nebulizer solution 15 mcg   DISCONTD: Ipratropium-Albuterol (COMBIVENT) respimat 1 puff   ipratropium-albuterol (DUONEB) 0.5-2.5 (3) MG/3ML nebulizer solution 3 mL   levETIRAcetam (KEPPRA) tablet 750 mg   DISCONTD: albuterol (VENTOLIN HFA) 108 (90 Base) MCG/ACT inhaler 1 puff   ezetimibe (ZETIA) tablet 10 mg   polyvinyl alcohol (LIQUIFILM TEARS) 1.4 % ophthalmic solution 1 drop   pyridOXINE (VITAMIN B6) tablet 200 mg   heparin injection 5,000 Units   DISCONTD: lactated ringers infusion   OR Linked Order Group     acetaminophen (TYLENOL) tablet 650 mg    acetaminophen (TYLENOL) suppository 650 mg   famotidine (PEPCID) tablet 20 mg    Unresulted Labs (From admission, onward)     Start     Ordered   06/28/22 0500  CBC  Tomorrow morning,   STAT        06/27/22 1907   06/28/22 0500  Comprehensive metabolic panel  Tomorrow morning,   STAT        06/27/22 1907   06/27/22 1906  Lactic acid, plasma  Once,   STAT        06/27/22 1906  Admission Imaging : CT HEAD WO CONTRAST (5MM)  Result Date: 06/27/2022 CLINICAL DATA:  Head trauma, moderate-severe; Neck trauma (Age >= 65y) EXAM: CT HEAD WITHOUT CONTRAST CT CERVICAL SPINE WITHOUT CONTRAST TECHNIQUE: Multidetector CT imaging of the head and cervical spine was performed following the standard protocol without intravenous contrast. Multiplanar CT image reconstructions of the cervical spine were also generated. RADIATION DOSE REDUCTION: This exam was performed according to the departmental dose-optimization program which includes automated exposure control, adjustment of the mA and/or kV according to patient size and/or use of iterative reconstruction technique. COMPARISON:  None Available. FINDINGS: CT HEAD FINDINGS BRAIN: BRAIN Prominence of the lateral ventricles may be related to central predominant atrophy, although a component of normal pressure/communicating hydrocephalus cannot be excluded. Patchy and confluent areas of decreased attenuation are noted throughout the deep and periventricular white matter of the cerebral hemispheres bilaterally, compatible with chronic microvascular ischemic disease. No evidence of large-territorial acute infarction. No parenchymal hemorrhage. No mass lesion. No extra-axial collection. No mass effect or midline shift. No hydrocephalus. Basilar cisterns are patent. Vascular: No hyperdense vessel. Skull: No acute fracture or focal lesion. Sinuses/Orbits: Paranasal sinuses and mastoid air cells are clear. Bilateral  lens replacements. Otherwise the orbits are unremarkable. Other: None. CT CERVICAL SPINE FINDINGS Alignment: Exaggerated lordotic curvature the cervical spine centered at the C6 level. Skull base and vertebrae: Multilevel mild-to-moderate degenerative changes of the spine. Associated multilevel moderate severe osseous neural foraminal stenosis. No severe osseous central canal stenosis. No acute fracture. No aggressive appearing focal osseous lesion or focal pathologic process. Soft tissues and spinal canal: No prevertebral fluid or swelling. No visible canal hematoma. Upper chest: Partially visualized biapical, left greater than right, pleural/pulmonary scarring with associated calcifications. Severe edematous changes. Other: None. IMPRESSION: 1. No acute intracranial abnormality. 2. Prominence of the lateral ventricles may be related to central predominant atrophy, although a component of normal pressure/communicating hydrocephalus cannot be excluded. 3. No acute displaced fracture or traumatic listhesis of the cervical spine. 4. Exaggerated lordotic curvature of the cervical spine and multilevel mild-to-moderate degenerative changes of the spine with associated multilevel moderate severe osseous neural foraminal stenosis. 5. Emphysema (ICD10-J43.9) - severe. Partially visualized biapical, left greater than right, pleural/pulmonary scarring with associated calcifications. Electronically Signed   By: Iven Finn M.D.   On: 06/27/2022 16:54   CT Cervical Spine Wo Contrast  Result Date: 06/27/2022 CLINICAL DATA:  Head trauma, moderate-severe; Neck trauma (Age >= 65y) EXAM: CT HEAD WITHOUT CONTRAST CT CERVICAL SPINE WITHOUT CONTRAST TECHNIQUE: Multidetector CT imaging of the head and cervical spine was performed following the standard protocol without intravenous contrast. Multiplanar CT image reconstructions of the cervical spine were also generated. RADIATION DOSE REDUCTION: This exam was performed according  to the departmental dose-optimization program which includes automated exposure control, adjustment of the mA and/or kV according to patient size and/or use of iterative reconstruction technique. COMPARISON:  None Available. FINDINGS: CT HEAD FINDINGS BRAIN: BRAIN Prominence of the lateral ventricles may be related to central predominant atrophy, although a component of normal pressure/communicating hydrocephalus cannot be excluded. Patchy and confluent areas of decreased attenuation are noted throughout the deep and periventricular white matter of the cerebral hemispheres bilaterally, compatible with chronic microvascular ischemic disease. No evidence of large-territorial acute infarction. No parenchymal hemorrhage. No mass lesion. No extra-axial collection. No mass effect or midline shift. No hydrocephalus. Basilar cisterns are patent. Vascular: No hyperdense vessel. Skull: No acute fracture or focal lesion. Sinuses/Orbits: Paranasal sinuses and mastoid air cells are  clear. Bilateral lens replacements. Otherwise the orbits are unremarkable. Other: None. CT CERVICAL SPINE FINDINGS Alignment: Exaggerated lordotic curvature the cervical spine centered at the C6 level. Skull base and vertebrae: Multilevel mild-to-moderate degenerative changes of the spine. Associated multilevel moderate severe osseous neural foraminal stenosis. No severe osseous central canal stenosis. No acute fracture. No aggressive appearing focal osseous lesion or focal pathologic process. Soft tissues and spinal canal: No prevertebral fluid or swelling. No visible canal hematoma. Upper chest: Partially visualized biapical, left greater than right, pleural/pulmonary scarring with associated calcifications. Severe edematous changes. Other: None. IMPRESSION: 1. No acute intracranial abnormality. 2. Prominence of the lateral ventricles may be related to central predominant atrophy, although a component of normal pressure/communicating hydrocephalus  cannot be excluded. 3. No acute displaced fracture or traumatic listhesis of the cervical spine. 4. Exaggerated lordotic curvature of the cervical spine and multilevel mild-to-moderate degenerative changes of the spine with associated multilevel moderate severe osseous neural foraminal stenosis. 5. Emphysema (ICD10-J43.9) - severe. Partially visualized biapical, left greater than right, pleural/pulmonary scarring with associated calcifications. Electronically Signed   By: Iven Finn M.D.   On: 06/27/2022 16:54   DG Chest Portable 1 View  Result Date: 06/27/2022 CLINICAL DATA:  Fall with bruising to right upper arm 2 days ago. EXAM: PORTABLE CHEST 1 VIEW COMPARISON:  Chest two views 06/06/2018 and 09/22/2017; CT chest 08/31/2018 FINDINGS: The patient's chin obscures the superomedial aspect of the bilateral lungs. There is flattening of the diaphragms and high-grade hyperinflation. Increased lucencies within the bilateral lungs are again seen consistent with the high-grade centrilobular emphysematous changes on prior CT. Moderate bilateral apical pleural thickening/scarring. Cardiac silhouette and mediastinal contours are within normal limits. Moderate vascular calcifications. Unchanged mild bibasilar interstitial thickening. No pleural effusion or pneumothorax. Severe left L2-3 disc space narrowing is similar to prior. Approximately 12 mm right lateral listhesis of L3 on L4 is unchanged. There appears to be cement density overlying the midthoracic spine on single frontal view, possibly kyphoplasty cement. IMPRESSION: 1. No acute cardiopulmonary disease. 2. Unchanged high-grade hyperinflation and emphysema. 3. Unchanged bilateral apical pleural thickening/scarring. 4. 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 Electronically Signed   By: Yvonne Kendall M.D.   On: 06/27/2022 15:53   DG Humerus Right  Result Date: 06/27/2022 CLINICAL DATA:  Bruising to right upper arm. Right shoulder appears possibly out of  place, lower than left shoulder. EXAM: RIGHT HUMERUS - 2+ VIEW COMPARISON:  None Available. FINDINGS: There is diffuse decreased bone mineralization. There is an oblique fracture within the proximal humeral surgical neck with mild approximate 3 mm cortical step-off at the medial aspect. There is also a fracture of the greater tuberosity with slightly greater, likely 9 mm displacement. The glenohumeral joint remains normally located. Mild acromioclavicular joint space narrowing and peripheral osteophytosis. IMPRESSION: 1. Minimally displaced oblique fracture of the proximal humeral surgical neck. 2. Mildly displaced fracture of the humeral greater tuberosity. Electronically Signed   By: Yvonne Kendall M.D.   On: 06/27/2022 15:48      Physical Examination: Vitals:   06/27/22 1700 06/27/22 1830 06/27/22 1929 06/27/22 1958  BP: (!) 160/73 138/82 (!) 142/61 130/82  Pulse: (!) 109 (!) 105 (!) 103 (!) 101  Temp:   98.1 F (36.7 C) 99 F (37.2 C)  Resp:  (!) 25 (!) 24 20  Height:      Weight:      SpO2: 100% 100% 99% 98%  TempSrc:   Oral Oral  BMI (Calculated):  Physical Exam Vitals reviewed.  Constitutional:      General: She is not in acute distress. HENT:     Head: Normocephalic and atraumatic.     Right Ear: External ear normal.     Left Ear: External ear normal.     Nose: Nose normal.  Eyes:     Extraocular Movements: Extraocular movements intact.     Pupils: Pupils are equal, round, and reactive to light.  Cardiovascular:     Rate and Rhythm: Normal rate and regular rhythm.     Pulses: Normal pulses.     Heart sounds: Normal heart sounds.  Pulmonary:     Effort: Pulmonary effort is normal.     Breath sounds: Wheezing present.  Abdominal:     General: Bowel sounds are normal.     Palpations: Abdomen is soft.  Musculoskeletal:        General: Tenderness and signs of injury present.     Right shoulder: Tenderness present. Decreased range of motion. Normal pulse.     Right  lower leg: No edema.     Left lower leg: No edema.  Skin:    General: Skin is warm.  Neurological:     General: No focal deficit present.     Mental Status: She is alert and oriented to person, place, and time.  Psychiatric:        Mood and Affect: Mood normal.        Behavior: Behavior normal.    Assessment and Plan: * Fall at home, initial encounter Patient fell at home and is at high risk for falls due to malnutrition and status. Fall precautions. Physical therapy.   Humerus head fracture, right, closed, initial encounter Currently patient in a sling with right shoulder. As needed Tylenol for pain. Orthopedic consulted patient to follow-up outpatient upon discharge.   AKI (acute kidney injury) ([place]) Patient has acute kidney injury with a creatinine of 1.11 with a EGFR 51 which is new. See trend below. Lab Results  Component Value Date   CREATININE 1.11 (H) 06/27/2022   CREATININE 0.82 05/21/2019   CREATININE 0.96 09/02/2018  Suspect this is from UTI and dehydration and diuretic therapy combination. We will currently hold patient's home regimen with Lasix. We will renally dose all needed medications and avoid contrast and unless necessary. Patient initially given IV fluids in the emergency room, and I will cut down the rate to [facility] due to patient's frail state.  Also because currently holding Lasix.  Intake/Output Summary (Last 24 hours) at 06/27/2022 1954 Last data filed at 06/27/2022 1950 Gross per 24 hour  Intake 278.33 ml  Output 150 ml  Net 128.33 ml   Monitor I's and O's.  Pure wick in place.   UTI (urinary tract infection) Urinalysis today shows small leukocytes with 11-20 WBCs and has been started on Rocephin in the ED. We will continue and follow cultures. Urinalysis    Component Value Date/Time   COLORURINE YELLOW (A) 06/27/2022 1707   APPEARANCEUR CLEAR (A) 06/27/2022 1707   LABSPEC 1.016 06/27/2022 1707   PHURINE 6.0 06/27/2022 1707   GLUCOSEU  NEGATIVE 06/27/2022 1707   HGBUR NEGATIVE 06/27/2022 1707   BILIRUBINUR NEGATIVE 06/27/2022 1707   BILIRUBINUR negative 05/21/2019 1502   KETONESUR NEGATIVE 06/27/2022 1707   PROTEINUR 100 (A) 06/27/2022 1707   UROBILINOGEN 0.2 05/21/2019 1502   NITRITE NEGATIVE 06/27/2022 1707   LEUKOCYTESUR SMALL (A) 06/27/2022 1707     Centrilobular emphysema ([place]) Patient has a history of  emphysema and history of tobacco abuse.  Patient was last seen by pulmonologist in 2019. Patient is currently on home O2 at 4 L nasal cannula respiratory failure and hypoxia. Patient also has an upcoming pulmonology appointment. Currently patient is not baseline O2 4 L. We will continue her home regimen with Breo Ellipta, profile and, Pulmicort, Combivent, DuoNeb, as needed ProAir.  Atherosclerosis of native coronary artery of native heart with angina pectoris [facility]) Atherosclerosis noted on imaging study the past. We will continue patient, Patient is clinically does not have any chest pain or chest pressure. Zetia and aspirin.  Protein-calorie malnutrition, severe Patient has severe protein calorie malnutrition. She does state that she does not have an appetite. Daughter states that she eats like a plan but has a very good metabolism and does not gain weight.  And always has been thin.  Nutritional consult.  Seizure [facility]) Patient has been started on Keppra for seizure disorder. Seizure precautions.  Aspiration precautions. Patient was admitted to [facility] and was diagnosed with seizures or seizure-like activity.  Chronic respiratory failure with hypoxia, on home O2 therapy ([place]) Patient continued on oxygen at 4 L of nasal cannula Which is her baseline. Patient also continue patient on her home regimen of MDIs inhalers and nebulizers.    DVT prophylaxis:  Heparin    Code Status:  Full code    Family Communication:  Krontz,Donald (Spouse)  [phone] (Home Phone)   Disposition Plan:  Home     Consults called:  Ortho   Admission status: observation   Unit/ Expected LOS: Med tele.   Para Skeans MD [facility]  6 PM- 2 AM. Please contact me via secure Chat 6 PM-2 AM. [phone] ( Pager ) To contact the [facility] Attending or Consulting provider [place] or covering provider during after hours [place], for this patient.   Check the care team in [facility] and look for a) attending/consulting [facility] provider listed and b) the [facility] team listed Log into [email or web address] and use [place]'s universal password to access. If you do not have the password, please contact the hospital operator. Locate the [facility] provider you are looking for under [facility] and page to a number that you can be directly reached. If you still have difficulty reaching the provider, please page the [facility] (Director on Call) for the Hospitalists listed on amion for assistance. [email or web address] 06/27/2022, 8:00 PM

## 2022-06-27 NOTE — Assessment & Plan Note (Signed)
Currently patient in a sling with right shoulder. As needed Tylenol for pain. Orthopedic consulted patient to follow-up outpatient upon discharge.

## 2022-06-27 NOTE — Progress Notes (Signed)
Patient stated she would like to change her code status to DNR. Josefa Half, NP made aware.

## 2022-06-27 NOTE — ED Triage Notes (Signed)
Pt to ED POV for R arm injury after mechanical fall on Friday. R shoulder appears possibly out of place, lower than L shoulder, but pt may be sitting in this position due to pain. No LOC. Pt endorses Extensive bruising noted to anterior upper arm. Pt states she may have also hit R side/rib area with the fall. Bruising noted to R breast. Pt denies pain at this time but states R arm and R rib area are painful with movement.   Pt wears 4L oxygen at baseline. Breathing appears labored, which is her baseline according to daughter.   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 Hx COPD and R HF. Pt weighs 88 pounds.

## 2022-06-27 NOTE — Hospital Course (Signed)
Rt humerus fracture/ uti/ aki.

## 2022-06-27 NOTE — Assessment & Plan Note (Addendum)
Atherosclerosis noted on imaging study the past. We will continue patient, Patient is clinically does not have any chest pain or chest pressure. Zetia and aspirin.

## 2022-06-27 NOTE — Assessment & Plan Note (Addendum)
Patient continued on oxygen at 4 L of nasal cannula Which is her baseline. Patient also continue patient on her home regimen of MDIs inhalers and nebulizers.

## 2022-06-27 NOTE — ED Notes (Signed)
Patient taken to imaging. 

## 2022-06-27 NOTE — ED Notes (Signed)
Patient is resting on the stretcher. Daughter at bedside. Patient denies pain at this time. IV NS infusing without difficulty. Sling is in place.

## 2022-06-27 NOTE — Assessment & Plan Note (Signed)
Patient has severe protein calorie malnutrition. She does state that she does not have an appetite. Daughter states that she eats like a plan but has a very good metabolism and does not gain weight.  And always has been thin.  Nutritional consult.

## 2022-06-27 NOTE — Assessment & Plan Note (Addendum)
Patient has been started on Keppra for seizure disorder. Seizure precautions.  Aspiration precautions. Patient was admitted to [facility] and was diagnosed with seizures or seizure-like activity.

## 2022-06-27 NOTE — ED Provider Notes (Signed)
[facility] Provider Note    Event Date/Time   First MD Initiated Contact with Patient 06/27/22 1524     (approximate)   History   R arm injury   HPI  Gabriella Wiggins is a 81 y.o. female here with fall and bruising to her right arm.  The patient reportedly fell over the weekend.  She initially did not have a significant amount of pain.  However, over the last 24 hours, she has had significant increase in bruising throughout her right arm and marked pain with any kind of movement.  Patient has also had some generalized weakness and she tells me that she feels "shaky."  Denies any other major complaints.  No recent medication changes.  Denies any urinary symptoms.  No head injury.     Physical Exam   Triage Vital Signs: ED Triage Vitals  Enc Vitals Group     BP 06/27/22 1518 (!) 179/78     Pulse Rate 06/27/22 1518 (!) 112     Resp 06/27/22 1518 (!) 24     Temp 06/27/22 1518 97.7 F (36.5 C)     Temp Source 06/27/22 1518 Oral     SpO2 06/27/22 1518 100 %     Weight 06/27/22 1523 88 lb (39.9 kg)     Height 06/27/22 1523 '5\' 3"'$  (1.6 m)     Head Circumference --      Peak Flow --      Pain Score 06/27/22 1523 0     Pain Loc --      Pain Edu? --      Excl. in [place]? --     Most recent vital signs: Vitals:   06/27/22 1700 06/27/22 1830  BP: (!) 160/73 138/82  Pulse: (!) 109 (!) 105  Resp:  (!) 25  Temp:    SpO2: 100% 100%     General: Awake, no distress.  CV:  Good peripheral perfusion.  Mildly tachycardic. Resp:  Normal effort.  Tenderness to palpation over the right chest wall. Abd:  No distention.  Minimal tenderness in the suprapubic area. Other:  Bruising and ecchymosis to the right upper extremity, from the shoulder to the elbow.  Exquisite tenderness over the humeral head.  No obvious deformity.  Distal strength and sensation is intact.   ED Results / Procedures / Treatments   Labs (all labs ordered are listed, but only abnormal results  are displayed) Labs Reviewed  CBC WITH DIFFERENTIAL/PLATELET - Abnormal; Notable for the following components:      Result Value   RBC 3.14 (*)    Hemoglobin 9.6 (*)    HCT 31.6 (*)    MCV 100.6 (*)    Neutro Abs 8.8 (*)    Lymphs Abs 0.5 (*)    All other components within normal limits  COMPREHENSIVE METABOLIC PANEL - Abnormal; Notable for the following components:   Chloride 96 (*)    CO2 37 (*)    Glucose, Bld 116 (*)    BUN 34 (*)    Creatinine, Ser 1.11 (*)    GFR, Estimated 51 (*)    All other components within normal limits  URINALYSIS, ROUTINE W REFLEX MICROSCOPIC - Abnormal; Notable for the following components:   Color, Urine YELLOW (*)    APPearance CLEAR (*)    Protein, ur 100 (*)    Leukocytes,Ua SMALL (*)    Bacteria, UA RARE (*)    All other components within normal limits  BRAIN NATRIURETIC  PEPTIDE - Abnormal; Notable for the following components:   B Natriuretic Peptide 352.6 (*)    All other components within normal limits  TROPONIN I (HIGH SENSITIVITY) - Abnormal; Notable for the following components:   Troponin I (High Sensitivity) 37 (*)    All other components within normal limits  SARS CORONAVIRUS 2 BY RT PCR  LACTIC ACID, PLASMA  CK  LACTIC ACID, PLASMA     EKG    RADIOLOGY Chest x-ray: Biapical pleural thickening, no acute disease CT head: No acute intracranial normality CT C-spine: No acute injury   I also independently reviewed and agree with radiologist interpretations.   PROCEDURES:  Critical Care performed: No     MEDICATIONS ORDERED IN ED: Medications  0.9 %  sodium chloride infusion ( Intravenous New Bag/Given 06/27/22 1756)  acetaminophen (TYLENOL) tablet 650 mg (650 mg Oral Given 06/27/22 1712)  ipratropium-albuterol (DUONEB) 0.5-2.5 (3) MG/3ML nebulizer solution 3 mL (3 mLs Nebulization Given 06/27/22 1712)  cefTRIAXone (ROCEPHIN) 1 g in sodium chloride 0.9 % 100 mL IVPB (1 g Intravenous New Bag/Given 06/27/22 1811)      IMPRESSION / MDM / ASSESSMENT AND PLAN / ED COURSE  I reviewed the triage vital signs and the nursing notes.                               The patient is on the cardiac monitor to evaluate for evidence of arrhythmia and/or significant heart rate changes.   Ddx:  Differential includes the following, with pertinent life- or limb-threatening emergencies considered:  Mechanical fall, fall due to generalized weakness from UTI, pneumonia, anemia, ACS, electrolyte abnormality, polypharmacy  Patient's presentation is most consistent with acute presentation with potential threat to life or bodily function.  MDM:  81 year old female here with fall and generalized weakness.  Regarding her fall, imaging shows a minimally displaced oblique fracture of the proximal humeral surgical neck and greater tuberosity.  This was discussed with Dr. Posey Pronto of orthopedics.  Patient will be placed in a sling and will need follow-up in a week.  Otherwise, regarding the etiology of her fall, patient does state that she has felt generally weak.  Screening lab work shows dehydration with elevated BUN and creatinine above her baseline.  No significant leukocytosis.  Lactic acid is normal.  Troponin and BNP both minimally elevated, likely demand related.  She does not appear hypervolemic clinically.  UA on cath sample is concerning for UTI.  Will add on culture, start Rocephin, and admit.   MEDICATIONS GIVEN IN ED: Medications  0.9 %  sodium chloride infusion ( Intravenous New Bag/Given 06/27/22 1756)  acetaminophen (TYLENOL) tablet 650 mg (650 mg Oral Given 06/27/22 1712)  ipratropium-albuterol (DUONEB) 0.5-2.5 (3) MG/3ML nebulizer solution 3 mL (3 mLs Nebulization Given 06/27/22 1712)  cefTRIAXone (ROCEPHIN) 1 g in sodium chloride 0.9 % 100 mL IVPB (1 g Intravenous New Bag/Given 06/27/22 1811)     Consults:  Dr. Posey Pronto, orthopedics Hospitalist   EMR reviewed       FINAL CLINICAL IMPRESSION(S) / ED  DIAGNOSES   Final diagnoses:  Closed fracture of right upper extremity, initial encounter  Acute cystitis without hematuria  Generalized weakness     Rx / DC Orders   ED Discharge Orders     None        Note:  This document was prepared using Dragon voice recognition software and may include unintentional dictation errors.   Ellender Hose,  Lysbeth Galas, MD 06/27/22 [phone]

## 2022-06-27 NOTE — Progress Notes (Signed)
       CROSS COVER NOTE  NAME: Gabriella Wiggins MRN: [id number] DOB : Apr 02, 1941    Date of Service   06/27/2022   HPI/Events of Note   Notified by nursing staff patient states she would like to change code status to DNR. On my arrival to bedside tonight she is confused and tells me she is at home, I asked specifically if the room she's in looks like her bedroom or the hospital and M(r)s Wiggins states her bedroom. She recalls she came in for a fall but is unable to tell me any additional details. Called husband at [phone] to discuss code status, no answer. HIPAA compliant voicemail left.  Interventions   Assessment/Plan:  Remain Full Code, revisit tomorrow      This document was prepared using Dragon voice recognition software and may include unintentional dictation errors.  Neomia Glass DNP, MHA, FNP-BC Nurse Practitioner [facility] Pager [phone]

## 2022-06-27 NOTE — Assessment & Plan Note (Signed)
Patient fell at home and is at high risk for falls due to malnutrition and status. Fall precautions. Physical therapy.

## 2022-06-28 ENCOUNTER — Encounter: Payer: Self-pay | Admitting: Internal Medicine

## 2022-06-28 DIAGNOSIS — Z9981 Dependence on supplemental oxygen: Secondary | ICD-10-CM

## 2022-06-28 DIAGNOSIS — W1830XA Fall on same level, unspecified, initial encounter: Secondary | ICD-10-CM | POA: Diagnosis present

## 2022-06-28 DIAGNOSIS — N3000 Acute cystitis without hematuria: Secondary | ICD-10-CM

## 2022-06-28 DIAGNOSIS — S42291A Other displaced fracture of upper end of right humerus, initial encounter for closed fracture: Secondary | ICD-10-CM | POA: Diagnosis present

## 2022-06-28 DIAGNOSIS — F0390 Unspecified dementia without behavioral disturbance: Secondary | ICD-10-CM | POA: Diagnosis present

## 2022-06-28 DIAGNOSIS — J432 Centrilobular emphysema: Secondary | ICD-10-CM | POA: Diagnosis present

## 2022-06-28 DIAGNOSIS — I701 Atherosclerosis of renal artery: Secondary | ICD-10-CM | POA: Diagnosis present

## 2022-06-28 DIAGNOSIS — Z681 Body mass index (BMI) 19 or less, adult: Secondary | ICD-10-CM | POA: Diagnosis not present

## 2022-06-28 DIAGNOSIS — W19XXXA Unspecified fall, initial encounter: Secondary | ICD-10-CM | POA: Diagnosis not present

## 2022-06-28 DIAGNOSIS — Y92009 Unspecified place in unspecified non-institutional (private) residence as the place of occurrence of the external cause: Secondary | ICD-10-CM | POA: Diagnosis not present

## 2022-06-28 DIAGNOSIS — R569 Unspecified convulsions: Secondary | ICD-10-CM

## 2022-06-28 DIAGNOSIS — Z66 Do not resuscitate: Secondary | ICD-10-CM | POA: Diagnosis present

## 2022-06-28 DIAGNOSIS — I25119 Atherosclerotic heart disease of native coronary artery with unspecified angina pectoris: Secondary | ICD-10-CM | POA: Diagnosis present

## 2022-06-28 DIAGNOSIS — E860 Dehydration: Secondary | ICD-10-CM | POA: Diagnosis present

## 2022-06-28 DIAGNOSIS — J9611 Chronic respiratory failure with hypoxia: Secondary | ICD-10-CM

## 2022-06-28 DIAGNOSIS — Z1152 Encounter for screening for COVID-19: Secondary | ICD-10-CM | POA: Diagnosis not present

## 2022-06-28 DIAGNOSIS — E43 Unspecified severe protein-calorie malnutrition: Secondary | ICD-10-CM

## 2022-06-28 DIAGNOSIS — Z91013 Allergy to seafood: Secondary | ICD-10-CM | POA: Diagnosis not present

## 2022-06-28 DIAGNOSIS — Z880 Allergy status to penicillin: Secondary | ICD-10-CM | POA: Diagnosis not present

## 2022-06-28 DIAGNOSIS — S42211A Unspecified displaced fracture of surgical neck of right humerus, initial encounter for closed fracture: Secondary | ICD-10-CM | POA: Diagnosis present

## 2022-06-28 DIAGNOSIS — Z923 Personal history of irradiation: Secondary | ICD-10-CM | POA: Diagnosis not present

## 2022-06-28 DIAGNOSIS — Z853 Personal history of malignant neoplasm of breast: Secondary | ICD-10-CM | POA: Diagnosis not present

## 2022-06-28 DIAGNOSIS — J9601 Acute respiratory failure with hypoxia: Secondary | ICD-10-CM | POA: Diagnosis present

## 2022-06-28 DIAGNOSIS — N1831 Chronic kidney disease, stage 3a: Secondary | ICD-10-CM | POA: Diagnosis present

## 2022-06-28 DIAGNOSIS — N179 Acute kidney failure, unspecified: Secondary | ICD-10-CM | POA: Diagnosis present

## 2022-06-28 DIAGNOSIS — G40909 Epilepsy, unspecified, not intractable, without status epilepticus: Secondary | ICD-10-CM | POA: Diagnosis present

## 2022-06-28 DIAGNOSIS — I700 Atherosclerosis of aorta: Secondary | ICD-10-CM | POA: Diagnosis present

## 2022-06-28 DIAGNOSIS — Z87891 Personal history of nicotine dependence: Secondary | ICD-10-CM | POA: Diagnosis not present

## 2022-06-28 LAB — COMPREHENSIVE METABOLIC PANEL
ALT: 16 U/L (ref 0–44)
AST: 22 U/L (ref 15–41)
Albumin: 3.4 g/dL — ABNORMAL LOW (ref 3.5–5.0)
Alkaline Phosphatase: 79 U/L (ref 38–126)
Anion gap: 3 — ABNORMAL LOW (ref 5–15)
BUN: 28 mg/dL — ABNORMAL HIGH (ref 8–23)
CO2: 36 mmol/L — ABNORMAL HIGH (ref 22–32)
Calcium: 8.8 mg/dL — ABNORMAL LOW (ref 8.9–10.3)
Chloride: 98 mmol/L (ref 98–111)
Creatinine, Ser: 1.03 mg/dL — ABNORMAL HIGH (ref 0.44–1.00)
GFR, Estimated: 55 mL/min — ABNORMAL LOW (ref >60)
Glucose, Bld: 91 mg/dL (ref 70–99)
Potassium: 3.5 mmol/L (ref 3.5–5.1)
Sodium: 137 mmol/L (ref 135–145)
Total Bilirubin: 1.1 mg/dL (ref 0.3–1.2)
Total Protein: 6.2 g/dL — ABNORMAL LOW (ref 6.5–8.1)

## 2022-06-28 LAB — CBC
HCT: 26.5 % — ABNORMAL LOW (ref 36.0–46.0)
Hemoglobin: 7.9 g/dL — ABNORMAL LOW (ref 12.0–15.0)
MCH: 30.2 pg (ref 26.0–34.0)
MCHC: 29.8 g/dL — ABNORMAL LOW (ref 30.0–36.0)
MCV: 101.1 fL — ABNORMAL HIGH (ref 80.0–100.0)
Platelets: 184 10*3/uL (ref 150–400)
RBC: 2.62 MIL/uL — ABNORMAL LOW (ref 3.87–5.11)
RDW: 12.9 % (ref 11.5–15.5)
WBC: 6.2 10*3/uL (ref 4.0–10.5)
nRBC: 0 % (ref 0.0–0.2)

## 2022-06-28 MED ORDER — ADULT MULTIVITAMIN W/MINERALS CH
1.0000 | ORAL_TABLET | Freq: Every day | ORAL | Status: DC
  Administered 2022-06-29 – 2022-06-30 (×2): 1 via ORAL
  Filled 2022-06-28 (×2): qty 1

## 2022-06-28 NOTE — Progress Notes (Signed)
No orders for PT and OT eval, Dr Mal Misty notifed, orders placed.

## 2022-06-28 NOTE — Progress Notes (Addendum)
Progress Note    Gabriella Wiggins  CVE:[id number] DOB: October 27, 1940  DOA: 06/27/2022 PCP: Juline Patch, MD      Brief Narrative:    Medical records reviewed and are as summarized below:  Gabriella Wiggins is a 81 y.o. female with past medical history significant for dementia, COPD, chronic hypoxic respiratory failure on 4 L/min oxygen via nasal cannula, renal artery stenosis, tobacco use disorder, history of breast cancer status post left lumpectomy, aortic atherosclerosis, who presented to the hospital with right arm injury and pain following a mechanical fall at home (Friday, 06/26/2022).  She was found to have minimally displaced oblique fracture of the proximal humeral surgical neck.  She was treated with analgesics.  There was concern for AKI so she was also treated with IV fluids.  However it appears patient has underlying CKD stage IIIa.  She was treated with IV ceftriaxone for suspected UTI.        Assessment/Plan:   Principal Problem:   Fall at home, initial encounter Active Problems:   Humerus head fracture, right, closed, initial encounter   AKI (acute kidney injury) ([place])   UTI (urinary tract infection)   Centrilobular emphysema ([place])   Atherosclerosis of native coronary artery of native heart with angina pectoris ([place])   Protein-calorie malnutrition, severe   Seizure ([place])   Chronic respiratory failure with hypoxia, on home O2 therapy ([place])    Body mass index is 14.38 kg/m.  (Underweight)  Closed right humeral head fracture, s/p fall at home: Analgesics as needed for pain.  Right arm is supported in a sling.  Outpatient follow-up with orthopedic surgeon.  Dr. Duffy Bruce, ED physician, had discussed the case with Dr. Posey Pronto, orthopedic surgeon, on 06/27/2022 and he recommended outpatient follow-up in a week.  PT and OT evaluation.  Suspected UTI: Continue IV ceftriaxone.  Urine culture has been  COPD with chronic hypoxic respiratory failure: Continue 4 L/min  oxygen by nasal cannula ordered  Seizure disorder: Continue Keppra  CKD stage IIIa: Creatinine is stable.  No AKI.  Last creatinine on record prior to admission was 0.82 on 05/21/2019.  Plan discussed with her husband at the bedside.  CODE STATUS changed from full code to DNR per his request.    Diet Order             Diet Heart Room service appropriate? Yes; Fluid consistency: Thin  Diet effective now                            Consultants: None  Procedures: None    Medications:    arformoterol  15 mcg Nebulization BID   [START ON 06/29/2022] aspirin EC  81 mg Oral QODAY   budesonide  0.5 mg Nebulization BID   ezetimibe  10 mg Oral Daily   famotidine  20 mg Oral BID   fluticasone furoate-vilanterol  1 puff Inhalation Daily   heparin  5,000 Units Subcutaneous Q8H   levETIRAcetam  750 mg Oral BID   pyridOXINE  200 mg Oral Daily   Continuous Infusions:  sodium chloride 10 mL/hr at 06/27/22 2209   cefTRIAXone (ROCEPHIN)  IV       Anti-infectives (From admission, onward)    Start     Dose/Rate Route Frequency Ordered Stop   06/28/22 1800  cefTRIAXone (ROCEPHIN) 1 g in sodium chloride 0.9 % 100 mL IVPB        1 g 200 mL/hr over  30 Minutes Intravenous Every 24 hours 06/27/22 1907     06/27/22 1815  cefTRIAXone (ROCEPHIN) 1 g in sodium chloride 0.9 % 100 mL IVPB        1 g 200 mL/hr over 30 Minutes Intravenous  Once 06/27/22 1802 06/27/22 1911              Family Communication/Anticipated D/C date and plan/Code Status   DVT prophylaxis: heparin injection 5,000 Units Start: 06/27/22 2200 SCDs Start: 06/27/22 1905    Code Status: DNR.  Her husband said patient is DNR and they have the papers at home.  Family Communication: Plan discussed with her husband at the bedside Disposition Plan: To be determined pending PT and OT evaluation   Status is: Observation The patient will require care spanning > 2 midnights and should be moved to  inpatient because: May need discharge to SNF       Subjective:   Interval events noted.  She does not provide much history.  She does not report any pain in the right arm.  She does not report any urinary symptoms.  She is unable to provide an adequate history.  She is not forthcoming with answers and she becomes irritated with repeated questioning.  Objective:    Vitals:   06/28/22 0500 06/28/22 0750 06/28/22 0814 06/28/22 0857  BP: 132/88 (!) 157/102  139/86  Pulse: 99 (!) 105  99  Resp: (!) 23   20  Temp: 99 F (37.2 C) 97.6 F (36.4 C)  97.9 F (36.6 C)  TempSrc: Oral     SpO2: 100% 99% 99% 100%  Weight:      Height:       No data found.   Intake/Output Summary (Last 24 hours) at 06/28/2022 1039 Last data filed at 06/28/2022 0742 Gross per 24 hour  Intake 493.83 ml  Output 150 ml  Net 343.83 ml   Filed Weights   06/27/22 1523 06/27/22 2026  Weight: 39.9 kg 36.8 kg    Exam:  GEN: NAD SKIN: Warm and dry EYES: No pallor or icterus ENT: MMM CV: RRR PULM: CTA B ABD: soft, ND, NT, +BS CNS: AAO x 3, non focal EXT: Bruising/ecchymosis on right arm.  Right arm is in a sling.       Data Reviewed:   I have personally reviewed following labs and imaging studies:  Labs: Labs show the following:   Basic Metabolic Panel: Recent Labs  Lab 06/27/22 1706 06/27/22 1707 06/28/22 0525  NA  --  140 137  K  --  4.7 3.5  CL  --  96* 98  CO2  --  37* 36*  GLUCOSE  --  116* 91  BUN  --  34* 28*  CREATININE  --  1.11* 1.03*  CALCIUM  --  9.4 8.8*  MG 2.2  --   --    GFR Estimated Creatinine Clearance: 25.7 mL/min (A) (by C-G formula based on SCr of 1.03 mg/dL (H)). Liver Function Tests: Recent Labs  Lab 06/27/22 1707 06/28/22 0525  AST 28 22  ALT 19 16  ALKPHOS 101 79  BILITOT 1.0 1.1  PROT 7.4 6.2*  ALBUMIN 4.0 3.4*   No results for input(s): "LIPASE", "AMYLASE" in the last 168 hours. No results for input(s): "AMMONIA" in the last 168  hours. Coagulation profile No results for input(s): "INR", "PROTIME" in the last 168 hours.  CBC: Recent Labs  Lab 06/27/22 1707 06/28/22 0525  WBC 10.0 6.2  NEUTROABS 8.8*  --  HGB 9.6* 7.9*  HCT 31.6* 26.5*  MCV 100.6* 101.1*  PLT 213 184   Cardiac Enzymes: Recent Labs  Lab 06/27/22 1707  CKTOTAL 100   BNP (last 3 results) No results for input(s): "PROBNP" in the last 8760 hours. CBG: No results for input(s): "GLUCAP" in the last 168 hours. D-Dimer: No results for input(s): "DDIMER" in the last 72 hours. Hgb A1c: No results for input(s): "HGBA1C" in the last 72 hours. Lipid Profile: No results for input(s): "CHOL", "HDL", "LDLCALC", "TRIG", "CHOLHDL", "LDLDIRECT" in the last 72 hours. Thyroid function studies: No results for input(s): "TSH", "T4TOTAL", "T3FREE", "THYROIDAB" in the last 72 hours.  Invalid input(s): "FREET3" Anemia work up: No results for input(s): "VITAMINB12", "FOLATE", "FERRITIN", "TIBC", "IRON", "RETICCTPCT" in the last 72 hours. Sepsis Labs: Recent Labs  Lab 06/27/22 1707 06/27/22 2038 06/28/22 0525  WBC 10.0  --  6.2  LATICACIDVEN 0.8 1.2  --     Microbiology Recent Results (from the past 240 hour(s))  SARS Coronavirus 2 by RT PCR (hospital order, performed in [facility] hospital lab) *cepheid single result test* Anterior Nasal Swab     Status: None   Collection Time: 06/27/22  6:18 PM   Specimen: Anterior Nasal Swab  Result Value Ref Range Status   SARS Coronavirus 2 by RT PCR NEGATIVE NEGATIVE Final    Comment: (NOTE) SARS-CoV-2 target nucleic acids are NOT DETECTED.  The SARS-CoV-2 RNA is generally detectable in upper and lower respiratory specimens during the acute phase of infection. The lowest concentration of SARS-CoV-2 viral copies this assay can detect is 250 copies / mL. A negative result does not preclude SARS-CoV-2 infection and should not be used as the sole basis for treatment or other patient management decisions.   A negative result may occur with improper specimen collection / handling, submission of specimen other than nasopharyngeal swab, presence of viral mutation(s) within the areas targeted by this assay, and inadequate number of viral copies (<250 copies / mL). A negative result must be combined with clinical observations, patient history, and epidemiological information.  Fact Sheet for Patients:   https://www.patel.info/  Fact Sheet for Healthcare Providers: https://hall.com/  This test is not yet approved or  cleared by the [place] FDA and has been authorized for detection and/or diagnosis of SARS-CoV-2 by FDA under an Emergency Use Authorization (EUA).  This EUA will remain in effect (meaning this test can be used) for the duration of the COVID-19 declaration under Section 564(b)(1) of the Act, 21 U.S.C. section 360bbb-3(b)(1), unless the authorization is terminated or revoked sooner.  Performed at [facility], [address] [place], [place] [address]     Procedures and diagnostic studies:  CT HEAD WO CONTRAST (5MM)  Result Date: 06/27/2022 CLINICAL DATA:  Head trauma, moderate-severe; Neck trauma (Age >= 65y) EXAM: CT HEAD WITHOUT CONTRAST CT CERVICAL SPINE WITHOUT CONTRAST TECHNIQUE: Multidetector CT imaging of the head and cervical spine was performed following the standard protocol without intravenous contrast. Multiplanar CT image reconstructions of the cervical spine were also generated. RADIATION DOSE REDUCTION: This exam was performed according to the departmental dose-optimization program which includes automated exposure control, adjustment of the mA and/or kV according to patient size and/or use of iterative reconstruction technique. COMPARISON:  None Available. FINDINGS: CT HEAD FINDINGS BRAIN: BRAIN Prominence of the lateral ventricles may be related to central predominant atrophy, although a component of normal  pressure/communicating hydrocephalus cannot be excluded. Patchy and confluent areas of decreased attenuation are noted throughout  the deep and periventricular white matter of the cerebral hemispheres bilaterally, compatible with chronic microvascular ischemic disease. No evidence of large-territorial acute infarction. No parenchymal hemorrhage. No mass lesion. No extra-axial collection. No mass effect or midline shift. No hydrocephalus. Basilar cisterns are patent. Vascular: No hyperdense vessel. Skull: No acute fracture or focal lesion. Sinuses/Orbits: Paranasal sinuses and mastoid air cells are clear. Bilateral lens replacements. Otherwise the orbits are unremarkable. Other: None. CT CERVICAL SPINE FINDINGS Alignment: Exaggerated lordotic curvature the cervical spine centered at the C6 level. Skull base and vertebrae: Multilevel mild-to-moderate degenerative changes of the spine. Associated multilevel moderate severe osseous neural foraminal stenosis. No severe osseous central canal stenosis. No acute fracture. No aggressive appearing focal osseous lesion or focal pathologic process. Soft tissues and spinal canal: No prevertebral fluid or swelling. No visible canal hematoma. Upper chest: Partially visualized biapical, left greater than right, pleural/pulmonary scarring with associated calcifications. Severe edematous changes. Other: None. IMPRESSION: 1. No acute intracranial abnormality. 2. Prominence of the lateral ventricles may be related to central predominant atrophy, although a component of normal pressure/communicating hydrocephalus cannot be excluded. 3. No acute displaced fracture or traumatic listhesis of the cervical spine. 4. Exaggerated lordotic curvature of the cervical spine and multilevel mild-to-moderate degenerative changes of the spine with associated multilevel moderate severe osseous neural foraminal stenosis. 5. Emphysema (ICD10-J43.9) - severe. Partially visualized biapical, left greater  than right, pleural/pulmonary scarring with associated calcifications. Electronically Signed   By: Iven Finn M.D.   On: 06/27/2022 16:54   CT Cervical Spine Wo Contrast  Result Date: 06/27/2022 CLINICAL DATA:  Head trauma, moderate-severe; Neck trauma (Age >= 65y) EXAM: CT HEAD WITHOUT CONTRAST CT CERVICAL SPINE WITHOUT CONTRAST TECHNIQUE: Multidetector CT imaging of the head and cervical spine was performed following the standard protocol without intravenous contrast. Multiplanar CT image reconstructions of the cervical spine were also generated. RADIATION DOSE REDUCTION: This exam was performed according to the departmental dose-optimization program which includes automated exposure control, adjustment of the mA and/or kV according to patient size and/or use of iterative reconstruction technique. COMPARISON:  None Available. FINDINGS: CT HEAD FINDINGS BRAIN: BRAIN Prominence of the lateral ventricles may be related to central predominant atrophy, although a component of normal pressure/communicating hydrocephalus cannot be excluded. Patchy and confluent areas of decreased attenuation are noted throughout the deep and periventricular white matter of the cerebral hemispheres bilaterally, compatible with chronic microvascular ischemic disease. No evidence of large-territorial acute infarction. No parenchymal hemorrhage. No mass lesion. No extra-axial collection. No mass effect or midline shift. No hydrocephalus. Basilar cisterns are patent. Vascular: No hyperdense vessel. Skull: No acute fracture or focal lesion. Sinuses/Orbits: Paranasal sinuses and mastoid air cells are clear. Bilateral lens replacements. Otherwise the orbits are unremarkable. Other: None. CT CERVICAL SPINE FINDINGS Alignment: Exaggerated lordotic curvature the cervical spine centered at the C6 level. Skull base and vertebrae: Multilevel mild-to-moderate degenerative changes of the spine. Associated multilevel moderate severe osseous  neural foraminal stenosis. No severe osseous central canal stenosis. No acute fracture. No aggressive appearing focal osseous lesion or focal pathologic process. Soft tissues and spinal canal: No prevertebral fluid or swelling. No visible canal hematoma. Upper chest: Partially visualized biapical, left greater than right, pleural/pulmonary scarring with associated calcifications. Severe edematous changes. Other: None. IMPRESSION: 1. No acute intracranial abnormality. 2. Prominence of the lateral ventricles may be related to central predominant atrophy, although a component of normal pressure/communicating hydrocephalus cannot be excluded. 3. No acute displaced fracture or traumatic listhesis of the cervical spine.  4. Exaggerated lordotic curvature of the cervical spine and multilevel mild-to-moderate degenerative changes of the spine with associated multilevel moderate severe osseous neural foraminal stenosis. 5. Emphysema (ICD10-J43.9) - severe. Partially visualized biapical, left greater than right, pleural/pulmonary scarring with associated calcifications. Electronically Signed   By: Iven Finn M.D.   On: 06/27/2022 16:54   DG Chest Portable 1 View  Result Date: 06/27/2022 CLINICAL DATA:  Fall with bruising to right upper arm 2 days ago. EXAM: PORTABLE CHEST 1 VIEW COMPARISON:  Chest two views 06/06/2018 and 09/22/2017; CT chest 08/31/2018 FINDINGS: The patient's chin obscures the superomedial aspect of the bilateral lungs. There is flattening of the diaphragms and high-grade hyperinflation. Increased lucencies within the bilateral lungs are again seen consistent with the high-grade centrilobular emphysematous changes on prior CT. Moderate bilateral apical pleural thickening/scarring. Cardiac silhouette and mediastinal contours are within normal limits. Moderate vascular calcifications. Unchanged mild bibasilar interstitial thickening. No pleural effusion or pneumothorax. Severe left L2-3 disc space  narrowing is similar to prior. Approximately 12 mm right lateral listhesis of L3 on L4 is unchanged. There appears to be cement density overlying the midthoracic spine on single frontal view, possibly kyphoplasty cement. IMPRESSION: 1. No acute cardiopulmonary disease. 2. Unchanged high-grade hyperinflation and emphysema. 3. Unchanged bilateral apical pleural thickening/scarring. 4. 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 Electronically Signed   By: Yvonne Kendall M.D.   On: 06/27/2022 15:53   DG Humerus Right  Result Date: 06/27/2022 CLINICAL DATA:  Bruising to right upper arm. Right shoulder appears possibly out of place, lower than left shoulder. EXAM: RIGHT HUMERUS - 2+ VIEW COMPARISON:  None Available. FINDINGS: There is diffuse decreased bone mineralization. There is an oblique fracture within the proximal humeral surgical neck with mild approximate 3 mm cortical step-off at the medial aspect. There is also a fracture of the greater tuberosity with slightly greater, likely 9 mm displacement. The glenohumeral joint remains normally located. Mild acromioclavicular joint space narrowing and peripheral osteophytosis. IMPRESSION: 1. Minimally displaced oblique fracture of the proximal humeral surgical neck. 2. Mildly displaced fracture of the humeral greater tuberosity. Electronically Signed   By: Yvonne Kendall M.D.   On: 06/27/2022 15:48               LOS: 0 days   Bradely Rudin  [facility]   Pager on [email or web address]. If 7PM-7AM, please contact night-coverage at [email or web address]     06/28/2022, 10:39 AM

## 2022-06-28 NOTE — Progress Notes (Signed)
Initial Nutrition Assessment  DOCUMENTATION CODES:   Severe malnutrition in context of chronic illness, Underweight  INTERVENTION:   -Liberalize diet to regular for widest variety of meal selections -MVI with minerals daily -Magic cup TID with meals, each supplement provides 290 kcal and 9 grams of protein   NUTRITION DIAGNOSIS:   Severe Malnutrition related to chronic illness (COPD) as evidenced by severe fat depletion, severe muscle depletion.  GOAL:   Patient will meet greater than or equal to 90% of their needs  MONITOR:   PO intake, Supplement acceptance  REASON FOR ASSESSMENT:   Malnutrition Screening Tool    ASSESSMENT:   Pt admitted with rt shlulder pain s/p fall. PMH of COPD, seizures, and breast cancer.  Pt admitted with rt humerus fracture s/p fall.   Reviewed I/O's: +317 ml x 24 hours  UOP: 150 ml x 24 hours  Spoke with pt, who was sitting in recliner chair at time of visit. Pt not very talkative with this RD. She reports her appetite is fair at home and consumes mostly soups and stews. Observed breakfast tray- pt consumed about 50% of oatmeal. Pt reports that she drinks mostly juice at home and dislikes Ensure and Boost supplements (milky and clear based).   Reviewed wt hx; pt has experienced a 1.1% wt loss over the past 2 months, which is not significant for time frame.   Discussed importance of good meal and supplement intake to promote healing. Pt reports she likes ice cream and amenable to [employer].   Medications reviewed and include keppra and vitamin B6.   Labs reviewed: CBGS: 94.   NUTRITION - FOCUSED PHYSICAL EXAM:    Diet Order:   Diet Order             Diet regular Room service appropriate? Yes; Fluid consistency: Thin  Diet effective now                   EDUCATION NEEDS:   Education needs have been addressed  Skin:  Skin Assessment: Reviewed RN Assessment  Last BM:  Unknown  Height:   Ht Readings from Last 1  Encounters:  06/27/22 '5\' 3"'$  (1.6 m)    Weight:   Wt Readings from Last 1 Encounters:  06/27/22 36.8 kg    Ideal Body Weight:  52.3 kg  BMI:  Body mass index is 14.38 kg/m.  Estimated Nutritional Needs:   Kcal:  1450-1650  Protein:  60-75 grams  Fluid:  > 1.4 L    Loistine Chance, RD, LDN, [place] Registered Dietitian II Certified Diabetes Care and Education Specialist Please refer to [facility] for RD and/or RD on-call/weekend/after hours pager

## 2022-06-28 NOTE — Progress Notes (Signed)
   06/28/22 0857  Assess: MEWS Score  Temp 97.9 F (36.6 C)  BP 139/86  MAP (mmHg) 102  Pulse Rate 99  Resp 20  SpO2 100 %  O2 Device Nasal Cannula  O2 Flow Rate (L/min) 4 L/min  Assess: MEWS Score  MEWS Temp 0  MEWS Systolic 0  MEWS Pulse 0  MEWS RR 0  MEWS LOC 0  MEWS Score 0  MEWS Score Color Green  Assess: if the MEWS score is Yellow or Red  Were vital signs taken at a resting state? Yes  Focused Assessment No change from prior assessment  Does the patient meet 2 or more of the SIRS criteria? No  MEWS guidelines implemented *See Row Information* No, vital signs rechecked  Assess: SIRS CRITERIA  SIRS Temperature  0  SIRS Pulse 1  SIRS Respirations  0  SIRS WBC 1  SIRS Score Sum  2

## 2022-06-28 NOTE — Plan of Care (Signed)

## 2022-06-28 NOTE — TOC Progression Note (Signed)
Transition of Care [facility]) - Progression Note    Patient Details  Name: Gabriella Wiggins MRN: [id number] Date of Birth: June 14, 1941  Transition of Care [facility]) CM/SW [place], RN Phone Number: 06/28/2022, 2:25 PM  Clinical Narrative:     Patient is from home, PT to Eval and provide recommendation, TOC to follow and assist with DC planning       Expected Discharge Plan and Services                                                 Social Determinants of Health (SDOH) Interventions    Readmission Risk Interventions     No data to display

## 2022-06-29 LAB — URINE CULTURE: Culture: NO GROWTH

## 2022-06-29 LAB — BASIC METABOLIC PANEL
Anion gap: 7 (ref 5–15)
BUN: 22 mg/dL (ref 8–23)
CO2: 39 mmol/L — ABNORMAL HIGH (ref 22–32)
Calcium: 9.3 mg/dL (ref 8.9–10.3)
Chloride: 93 mmol/L — ABNORMAL LOW (ref 98–111)
Creatinine, Ser: 1.11 mg/dL — ABNORMAL HIGH (ref 0.44–1.00)
GFR, Estimated: 51 mL/min — ABNORMAL LOW (ref >60)
Glucose, Bld: 112 mg/dL — ABNORMAL HIGH (ref 70–99)
Potassium: 3.7 mmol/L (ref 3.5–5.1)
Sodium: 139 mmol/L (ref 135–145)

## 2022-06-29 LAB — CBC
HCT: 29.8 % — ABNORMAL LOW (ref 36.0–46.0)
Hemoglobin: 9.2 g/dL — ABNORMAL LOW (ref 12.0–15.0)
MCH: 31 pg (ref 26.0–34.0)
MCHC: 30.9 g/dL (ref 30.0–36.0)
MCV: 100.3 fL — ABNORMAL HIGH (ref 80.0–100.0)
Platelets: 213 10*3/uL (ref 150–400)
RBC: 2.97 MIL/uL — ABNORMAL LOW (ref 3.87–5.11)
RDW: 12.8 % (ref 11.5–15.5)
WBC: 6.2 10*3/uL (ref 4.0–10.5)
nRBC: 0 % (ref 0.0–0.2)

## 2022-06-29 NOTE — Evaluation (Signed)
Occupational Therapy Evaluation Patient Details Name: Gabriella Wiggins MRN: [id number] DOB: [birth year] Today's Date: 06/29/2022   History of Present Illness Per MD Notes: Gabriella Wiggins is a 81 y.o. female with past medical history significant for dementia, COPD, chronic hypoxic respiratory failure on 4 L/min oxygen via nasal cannula, renal artery stenosis, tobacco use disorder, history of breast cancer status post left lumpectomy, aortic atherosclerosis, who presented to the hospital with right arm injury and pain following a mechanical fall at home (Friday, 06/26/2022). She was found to have minimally displaced oblique fracture of the proximal humeral surgical neck.  She was treated with analgesics.  There was concern for AKI so she was also treated with IV fluids.  However it appears patient has underlying CKD stage IIIa.  She was treated with IV ceftriaxone for suspected UTI.   Clinical Impression   [name] was seen for OT evaluation this date. Prior to hospital admission, pt was MOD I for household mobility and basic transfers using a 4WW. Per pt spouse at bedside, pt has had multiple falls in the last 6 months along with multiple hospital admissions and was recently [facility] from [place] after a 4-5 day stay at [facility]. Pt lives with her spouse as her primary caregiver. Pt presents to acute OT demonstrating impaired ADL performance and functional mobility 2/2 decreased strength, safety awareness, and functional use of her RUE (See OT problem list). Pt currently requires MOD A for STS transfers, MAX A for UB dressing, bathing, and sling management. Pt would benefit from skilled OT services to address noted impairments and functional limitations (see below for any additional details) in order to maximize safety and independence while minimizing falls risk and caregiver burden. Upon hospital discharge, recommend HHOT to maximize pt safety and return to functional independence during meaningful occupations of  daily life.        Recommendations for follow up therapy are one component of a multi-disciplinary discharge planning process, led by the attending physician.  Recommendations may be updated based on patient status, additional functional criteria and insurance authorization.   Follow Up Recommendations  Home health OT    Assistance Recommended at Discharge Frequent or constant Supervision/Assistance  Patient can return home with the following A lot of help with walking and/or transfers;A lot of help with bathing/dressing/bathroom;Assist for transportation;Assistance with cooking/housework;Assistance with feeding;Help with stairs or ramp for entrance;Direct supervision/assist for medications management;Direct supervision/assist for financial management    Functional Status Assessment  Patient has had a recent decline in their functional status and/or demonstrates limited ability to make significant improvements in function in a reasonable and predictable amount of time  Equipment Recommendations  BSC/3in1    Recommendations for Other Services       Precautions / Restrictions Precautions Precautions: Fall Required Braces or Orthoses: Sling (RUE) Restrictions RUE Weight Bearing: Non weight bearing Other Position/Activity Restrictions: Awaiting Ortho consult. Treated as NWB with sling in place t/o session.      Mobility Bed Mobility Overal bed mobility: Needs Assistance             General bed mobility comments: Deferred. Pt in recliner at start/end of session.    Transfers Overall transfer level: Needs assistance Equipment used: 1 person hand held assist Transfers: Sit to/from Stand Sit to Stand: Mod assist                  Balance Overall balance assessment: Needs assistance Sitting-balance support: Feet supported, Single extremity supported Sitting balance-Leahy Scale: [place] Sitting  balance - Comments: Pt noted to have difficulty adjusting position. Increased  L lateral lean during static sitting.   Standing balance support: Single extremity supported, Reliant on assistive device for balance Standing balance-Leahy Scale: Fair                             ADL either performed or assessed with clinical judgement   ADL Overall ADL's : Needs assistance/impaired                                       General ADL Comments: Pt is singificantly functionally limited by generalized weakness, decreased activity tolerance, decreased functional use of her RUE, and decreased cognition. She requires MAX A to adjust sling for comfort/safety. MOD A for STS attempt from room recliner with max multimodal cueing to minimize use of her RUE.     Vision Baseline Vision/History: 1 Wears glasses Patient Visual Report: No change from baseline       Perception     Praxis      Pertinent Vitals/Pain Pain Assessment Pain Assessment: Faces Faces Pain Scale: Hurts little more Pain Location: RUE with movement. Pain Descriptors / Indicators: Guarding, Grimacing Pain Intervention(s): Limited activity within patient's tolerance, Monitored during session, Repositioned     Hand Dominance Right   Extremity/Trunk Assessment Upper Extremity Assessment Upper Extremity Assessment: Generalized weakness;RUE deficits/detail (LUE generally weak/deconditioned.) RUE Deficits / Details: RUE immobilized, treated as NWB. Awaiting ortho follow-up. RUE: Unable to fully assess due to immobilization       Cervical / Trunk Assessment Cervical / Trunk Assessment: Normal   Communication     Cognition Arousal/Alertness: Awake/alert Behavior During Therapy: Flat affect Overall Cognitive Status: Impaired/Different from baseline Area of Impairment: Following commands, Orientation, Memory, Safety/judgement                 Orientation Level: Disoriented to, Time   Memory: Decreased recall of precautions, Decreased short-term memory Following  Commands: Follows one step commands with increased time Safety/Judgement: Decreased awareness of safety, Decreased awareness of deficits     General Comments: Pt spouse at bedside states pt is not at baseline for cognition.     General Comments       Exercises Other Exercises Other Exercises: Pt/caregiver educated on role of OT in acute setting, safe use of AE/DME for ADL management, precautions for RUE, sling management, and extensive education re: DC recs this date.   Shoulder Instructions      Home Living Family/patient expects to be discharged to:: Private residence Living Arrangements: Spouse/significant other Available Help at Discharge: Available 24 hours/day;Family Type of Home: House Home Access: Level entry     Home Layout: Other (Comment) (3 steps up to kitchen)     Bathroom Shower/Tub: Teacher, early years/pre: Handicapped height     Home Equipment: [place] held shower head;Grab bars - tub/shower          Prior Functioning/Environment               Mobility Comments: Uses rollator for household distances. Doesn't do much community amb at baseline. ADLs Comments: husband assists with tub transfers, pt able to dress herself MOD I. Per spouse, pt recently DC'd from [place], and prior to this had been on hospice for several months before being DC'd. Her mobility and functional independence has become more limited  over last several months.        OT Problem List: Decreased strength;Decreased coordination;Decreased activity tolerance;Decreased safety awareness;Impaired balance (sitting and/or standing);Decreased knowledge of use of DME or AE;Decreased range of motion;Impaired UE functional use      OT Treatment/Interventions: Self-care/ADL training;Therapeutic exercise;Therapeutic activities;DME and/or AE instruction;Patient/family education;Balance training;Energy conservation    OT Goals(Current goals can be found in the care plan section)  Acute Rehab OT Goals Patient Stated Goal: To go home OT Goal Formulation: With patient Time For Goal Achievement: 07/13/22 Potential to Achieve Goals: Good ADL Goals Pt Will Perform Eating: sitting;with set-up Pt Will Perform Grooming: with set-up;sitting Pt Will Transfer to Toilet: bedside commode;with supervision;with set-up;stand pivot transfer Pt Will Perform Toileting - Clothing Manipulation and hygiene: sit to/from stand;with min assist;with caregiver independent in assisting  OT Frequency: Min 2X/week    Co-evaluation PT/OT/SLP Co-Evaluation/Treatment: Yes Reason for Co-Treatment: For patient/therapist safety;To address functional/ADL transfers PT goals addressed during session: Mobility/safety with mobility;Balance;Proper use of DME OT goals addressed during session: ADL's and self-care;Proper use of Adaptive equipment and DME      AM-PAC OT "6 Clicks" Daily Activity     Outcome Measure Help from another person eating meals?: A Little Help from another person taking care of personal grooming?: A Little Help from another person toileting, which includes using toliet, bedpan, or urinal?: A Little Help from another person bathing (including washing, rinsing, drying)?: A Little Help from another person to put on and taking off regular upper body clothing?: A Little Help from another person to put on and taking off regular lower body clothing?: A Little 6 Click Score: 18   End of Session Equipment Utilized During Treatment: Gait belt;Oxygen;Other (comment) (hemi-walker)  Activity Tolerance: Patient tolerated treatment well Patient left: with call bell/phone within reach;in chair;Other (comment);with family/visitor present (With PT in room to complete session.)  OT Visit Diagnosis: Other abnormalities of gait and mobility (R26.89);History of falling (Z91.81);Pain Pain - Right/Left: Right Pain - part of body: Arm;Shoulder                Time: 9604-5409 OT Time Calculation  (min): 48 min Charges:  OT General Charges $OT Visit: 1 Visit OT Evaluation $OT Eval Moderate Complexity: 1 Mod OT Treatments $Self Care/Home Management : 23-37 mins  Shara Blazing, M.S., OTR/L 06/29/22, 12:44 PM

## 2022-06-29 NOTE — Progress Notes (Addendum)
Progress Note    Gabriella Wiggins  IWL:[id number] DOB: Jun 14, 1941  DOA: 06/27/2022 PCP: Juline Patch, MD      Brief Narrative:    Medical records reviewed and are as summarized below:  Gabriella Wiggins is a 81 y.o. female with past medical history significant for dementia, COPD, chronic hypoxic respiratory failure on 4 L/min oxygen via nasal cannula, renal artery stenosis, tobacco use disorder, history of breast cancer status post left lumpectomy, aortic atherosclerosis, who presented to the hospital with right arm injury and pain following a mechanical fall at home (Friday, 06/26/2022).  She was found to have minimally displaced oblique fracture of the proximal humeral surgical neck.  She was treated with analgesics.  There was concern for AKI so she was also treated with IV fluids.  However, it appears patient has underlying CKD stage IIIa.  She was treated with IV ceftriaxone for suspected UTI.        Assessment/Plan:   Principal Problem:   Fall at home, initial encounter Active Problems:   Humerus head fracture, right, closed, initial encounter   UTI (urinary tract infection)   Centrilobular emphysema (HCC)   Atherosclerosis of native coronary artery of native heart with angina pectoris (HCC)   Protein-calorie malnutrition, severe   Seizure ([place])   Chronic respiratory failure with hypoxia, on home O2 therapy (HCC)    Body mass index is 14.38 kg/m.  (Underweight)  Closed right humeral head fracture, s/p fall at home: Analgesics as needed for pain.  Right arm is supported in a sling.  Outpatient follow-up with Dr. Leim Fabry, orthopedic surgeon, if desired.  Suspected UTI: Continue IV ceftriaxone.  Urine culture is pending.  COPD with chronic hypoxic respiratory failure: Continue 4 L/min oxygen via nasal cannula.  Seizure disorder: Continue Keppra  CKD stage IIIa: Creatinine is stable.  No AKI.  Last creatinine on record prior to admission was 0.82 on  05/21/2019.  Patient has been evaluated by the hospice team with plan to discharge home tomorrow with hospice.    Diet Order             Diet regular Room service appropriate? Yes; Fluid consistency: Thin  Diet effective now                            Consultants: None  Procedures: None    Medications:    arformoterol  15 mcg Nebulization BID   aspirin EC  81 mg Oral QODAY   budesonide  0.5 mg Nebulization BID   ezetimibe  10 mg Oral Daily   famotidine  20 mg Oral BID   fluticasone furoate-vilanterol  1 puff Inhalation Daily   heparin  5,000 Units Subcutaneous Q8H   levETIRAcetam  750 mg Oral BID   multivitamin with minerals  1 tablet Oral Daily   pyridOXINE  200 mg Oral Daily   Continuous Infusions:  cefTRIAXone (ROCEPHIN)  IV 1 g (06/28/22 1612)     Anti-infectives (From admission, onward)    Start     Dose/Rate Route Frequency Ordered Stop   06/28/22 1800  cefTRIAXone (ROCEPHIN) 1 g in sodium chloride 0.9 % 100 mL IVPB        1 g 200 mL/hr over 30 Minutes Intravenous Every 24 hours 06/27/22 1907     06/27/22 1815  cefTRIAXone (ROCEPHIN) 1 g in sodium chloride 0.9 % 100 mL IVPB        1 g 200  mL/hr over 30 Minutes Intravenous  Once 06/27/22 1802 06/27/22 1911              Family Communication/Anticipated D/C date and plan/Code Status   DVT prophylaxis: heparin injection 5,000 Units Start: 06/27/22 2200 SCDs Start: 06/27/22 1905    Code Status: DNR.  Her husband said patient is DNR and they have the papers at home.  Family Communication: I called her husband's phone [phone]) but it went straight to voicemail.  I left a voice message for him to call back if he has any questions. Disposition Plan: Plan to discharge home with hospice   Status is: Inpatient Remains inpatient appropriate because: On IV antibiotics for UTI, awaiting delivery of equipment for discharge to home with hospice         Subjective:   Interval  events noted.  She was resting comfortably in the chair at the bedside.  She said her right arm hurts only when she moves  Objective:    Vitals:   06/28/22 2330 06/29/22 0745 06/29/22 0837 06/29/22 1702  BP: (!) 152/91  (!) 155/87 (!) 157/96  Pulse: 97  91 97  Resp: '20  16 16  '$ Temp: 98.6 F (37 C)  97.8 F (36.6 C) 97.9 F (36.6 C)  TempSrc:      SpO2: 98% 100% 100% 100%  Weight:      Height:       No data found.   Intake/Output Summary (Last 24 hours) at 06/29/2022 1704 Last data filed at 06/29/2022 0746 Gross per 24 hour  Intake 58.78 ml  Output 550 ml  Net -491.22 ml   Filed Weights   06/27/22 1523 06/27/22 2026  Weight: 39.9 kg 36.8 kg    Exam:  GEN: NAD SKIN: No rash EYES: EOMI ENT: MMM CV: RRR PULM: CTA B ABD: soft, ND, NT, +BS CNS: AAO x 3, non focal EXT: Bruising on the right arm.  Right arm is in a sling.         Data Reviewed:   I have personally reviewed following labs and imaging studies:  Labs: Labs show the following:   Basic Metabolic Panel: Recent Labs  Lab 06/27/22 1706 06/27/22 1707 06/27/22 1707 06/28/22 0525 06/29/22 0920  NA  --  140  --  137 139  K  --  4.7   < > 3.5 3.7  CL  --  96*  --  98 93*  CO2  --  37*  --  36* 39*  GLUCOSE  --  116*  --  91 112*  BUN  --  34*  --  28* 22  CREATININE  --  1.11*  --  1.03* 1.11*  CALCIUM  --  9.4  --  8.8* 9.3  MG 2.2  --   --   --   --    < > = values in this interval not displayed.   GFR Estimated Creatinine Clearance: 23.9 mL/min (A) (by C-G formula based on SCr of 1.11 mg/dL (H)). Liver Function Tests: Recent Labs  Lab 06/27/22 1707 06/28/22 0525  AST 28 22  ALT 19 16  ALKPHOS 101 79  BILITOT 1.0 1.1  PROT 7.4 6.2*  ALBUMIN 4.0 3.4*   No results for input(s): "LIPASE", "AMYLASE" in the last 168 hours. No results for input(s): "AMMONIA" in the last 168 hours. Coagulation profile No results for input(s): "INR", "PROTIME" in the last 168 hours.  CBC: Recent  Labs  Lab 06/27/22 1707 06/28/22 0525  06/29/22 0928  WBC 10.0 6.2 6.2  NEUTROABS 8.8*  --   --   HGB 9.6* 7.9* 9.2*  HCT 31.6* 26.5* 29.8*  MCV 100.6* 101.1* 100.3*  PLT 213 184 213   Cardiac Enzymes: Recent Labs  Lab 06/27/22 1707  CKTOTAL 100   BNP (last 3 results) No results for input(s): "PROBNP" in the last 8760 hours. CBG: No results for input(s): "GLUCAP" in the last 168 hours. D-Dimer: No results for input(s): "DDIMER" in the last 72 hours. Hgb A1c: No results for input(s): "HGBA1C" in the last 72 hours. Lipid Profile: No results for input(s): "CHOL", "HDL", "LDLCALC", "TRIG", "CHOLHDL", "LDLDIRECT" in the last 72 hours. Thyroid function studies: No results for input(s): "TSH", "T4TOTAL", "T3FREE", "THYROIDAB" in the last 72 hours.  Invalid input(s): "FREET3" Anemia work up: No results for input(s): "VITAMINB12", "FOLATE", "FERRITIN", "TIBC", "IRON", "RETICCTPCT" in the last 72 hours. Sepsis Labs: Recent Labs  Lab 06/27/22 1707 06/27/22 2038 06/28/22 0525 06/29/22 0928  WBC 10.0  --  6.2 6.2  LATICACIDVEN 0.8 1.2  --   --     Microbiology Recent Results (from the past 240 hour(s))  SARS Coronavirus 2 by RT PCR (hospital order, performed in [facility] hospital lab) *cepheid single result test* Anterior Nasal Swab     Status: None   Collection Time: 06/27/22  6:18 PM   Specimen: Anterior Nasal Swab  Result Value Ref Range Status   SARS Coronavirus 2 by RT PCR NEGATIVE NEGATIVE Final    Comment: (NOTE) SARS-CoV-2 target nucleic acids are NOT DETECTED.  The SARS-CoV-2 RNA is generally detectable in upper and lower respiratory specimens during the acute phase of infection. The lowest concentration of SARS-CoV-2 viral copies this assay can detect is 250 copies / mL. A negative result does not preclude SARS-CoV-2 infection and should not be used as the sole basis for treatment or other patient management decisions.  A negative result may occur  with improper specimen collection / handling, submission of specimen other than nasopharyngeal swab, presence of viral mutation(s) within the areas targeted by this assay, and inadequate number of viral copies (<250 copies / mL). A negative result must be combined with clinical observations, patient history, and epidemiological information.  Fact Sheet for Patients:   https://www.patel.info/  Fact Sheet for Healthcare Providers: https://hall.com/  This test is not yet approved or  cleared by the [place] FDA and has been authorized for detection and/or diagnosis of SARS-CoV-2 by FDA under an Emergency Use Authorization (EUA).  This EUA will remain in effect (meaning this test can be used) for the duration of the COVID-19 declaration under Section 564(b)(1) of the Act, 21 U.S.C. section 360bbb-3(b)(1), unless the authorization is terminated or revoked sooner.  Performed at [facility], [address] [place],  [id number]     Procedures and diagnostic studies:  No results found.             LOS: 1 day   Gabriella Wiggins  [facility]   Pager on [email or web address]. If 7PM-7AM, please contact night-coverage at [email or web address]     06/29/2022, 5:04 PM

## 2022-06-29 NOTE — Plan of Care (Signed)

## 2022-06-29 NOTE — Progress Notes (Signed)
[facility]) Hospital Liaison Note   Received request from Transitions of Care Manager, Deliliah, for hospice services at home after discharge. Chart and patient information under review by [facility] physician. Hospice eligibility approved.   Spoke with spouse/Donald & patient to initiate education related to hospice philosophy, services, and team approach to care. Both verbalized understanding of information given. Per discussion, the plan is for patient to discharge home via TBD once cleared to DC.    DME needs discussed. Patient has the following equipment in the home (Purchased privately): O2 & walker Patient requests the following equipment for delivery: [facility] Bedside table  Address verified and is incorrect in the chart. Patient will be discharging to:  [place], [place] [address]  Please send signed and completed DNR home with patient/family. Please provide prescriptions at discharge as needed to ensure ongoing symptom management.    [facility] information and contact numbers given to family & above information shared with TOC.   Please call with any questions/concerns.    Thank you for the opportunity to participate in this patient's care.   Daphene Calamity, MSW [facility] Liaison  [phone]

## 2022-06-29 NOTE — Evaluation (Signed)
Physical Therapy Evaluation Patient Details Name: Gabriella Wiggins MRN: [id number] DOB: Jun 24, 1941 Today's Date: 06/29/2022  History of Present Illness  Per MD Notes: Gabriella Wiggins is a 81 y.o. female with past medical history significant for dementia, COPD, chronic hypoxic respiratory failure on 4 L/min oxygen via nasal cannula, renal artery stenosis, tobacco use disorder, history of breast cancer status post left lumpectomy, aortic atherosclerosis, who presented to the hospital with right arm injury and pain following a mechanical fall at home (Friday, 06/26/2022). She was found to have minimally displaced oblique fracture of the proximal humeral surgical neck.  She was treated with analgesics.  There was concern for AKI so she was also treated with IV fluids.  However it appears patient has underlying CKD stage IIIa.  She was treated with IV ceftriaxone for suspected UTI.  Clinical Impression  Pt received in chair with husband by her side and agreeable to  therapy evaluation. Pt lives with husband who is unable to provide care while pt needs one person assist with all ADLs and household level ambulation. Pt is now NWB to RUE. Pt in sling and Is awaiting immobilizer. PT assessment revealed pt needs min assist to transfer and ambulate in room with HW with VC for sequencing and safety. Pt balance is fair 2/2 to Cognition, NWB status and generalized weakness. Pt was receiving hospice care until 3 to4 months ago. Pt remains at high risk for falls. Pt will benefit from [place] assistance which includes PT/OT/ [facility] after acute care.  PT will continue providing Skilled interventions while in Acute.      Recommendations for follow up therapy are one component of a multi-disciplinary discharge planning process, led by the attending physician.  Recommendations may be updated based on patient status, additional functional criteria and insurance authorization.  Follow Up Recommendations  (max HH)       Assistance Recommended at Discharge Other (comment) (max HH ( PT/OT/ [facility]ide))  Patient can return home with the following  A little help with walking and/or transfers;A lot of help with bathing/dressing/bathroom;Assistance with cooking/housework;Assistance with feeding;Direct supervision/assist for medications management;Direct supervision/assist for financial management;Assist for transportation;Help with stairs or ramp for entrance    Equipment Recommendations Wheelchair (measurements PT)  Recommendations for Other Services       Functional Status Assessment Patient has had a recent decline in their functional status and/or demonstrates limited ability to make significant improvements in function in a reasonable and predictable amount of time     Precautions / Restrictions Precautions Precautions: Fall Required Braces or Orthoses: Sling (Immobilizer odered.) Restrictions Weight Bearing Restrictions: Yes RUE Weight Bearing: Non weight bearing Other Position/Activity Restrictions: Awaiting Ortho consult. Treated as NWB with sling in place t/o session.      Mobility  Bed Mobility Overal bed mobility: Needs Assistance             General bed mobility comments: received in chair    Transfers Overall transfer level: Needs assistance Equipment used: Hemi-walker Transfers: Sit to/from Stand Sit to Stand: Min assist           General transfer comment: cues for hand placement    Ambulation/Gait Ambulation/Gait assistance: Min assist Gait Distance (Feet): 8 Feet Assistive device: Hemi-walker Gait Pattern/deviations: Decreased stride length, Shuffle, Narrow base of support Gait velocity: dec        Stairs            Wheelchair Mobility    Modified Rankin (Stroke Patients Only)  Balance Overall balance assessment: Needs assistance Sitting-balance support: Feet supported, Single extremity supported Sitting balance-Leahy Scale: Fair     Standing  balance support: Single extremity supported, Reliant on assistive device for balance Standing balance-Leahy Scale: Fair                               Pertinent Vitals/Pain      Home Living Family/patient expects to be discharged to:: Private residence Living Arrangements: Spouse/significant other Available Help at Discharge: Available 24 hours/day;Family Type of Home: House Home Access: Level entry       Home Layout: Other (Comment) Home Equipment: Shower seat;Hand held shower head;Grab bars - tub/shower      Prior Function Prior Level of Function : Needs assist             Mobility Comments: Uses rollator for household distances. Doesn't do much community amb at baseline. Needs one person asssit with all mobility for safety. ADLs Comments: husband assists with tub transfers, pt able to dress herself MOD I. Per spouse, pt recently DC'd from [place], and prior to this had been on hospice for several months before being DC'd. Her mobility and functional independence has become more limited over last several months.     Hand Dominance   Dominant Hand: Right    Extremity/Trunk Assessment   Upper Extremity Assessment Upper Extremity Assessment: RUE deficits/detail;Generalized weakness RUE Deficits / Details: RUE immobilized, treated as NWB. Awaiting ortho follow-up. RUE: Unable to fully assess due to immobilization    Lower Extremity Assessment Lower Extremity Assessment: Generalized weakness    Cervical / Trunk Assessment Cervical / Trunk Assessment: Normal  Communication   Communication: Other (comment) (cognitive impairment so inconsistant with communication)  Cognition Arousal/Alertness: Awake/alert Behavior During Therapy: Flat affect Overall Cognitive Status: Impaired/Different from baseline Area of Impairment: Following commands, Orientation, Memory, Safety/judgement                 Orientation Level: Disoriented to, Time   Memory: Decreased  recall of precautions, Decreased short-term memory Following Commands: Follows one step commands with increased time Safety/Judgement: Decreased awareness of safety, Decreased awareness of deficits     General Comments: Pt spouse at bedside states pt is not at baseline for cognition.        General Comments      Exercises     Assessment/Plan    PT Assessment Patient needs continued PT services  PT Problem List Decreased strength;Decreased activity tolerance;Decreased range of motion;Decreased balance;Decreased mobility;Decreased cognition;Decreased safety awareness;Decreased knowledge of precautions       PT Treatment Interventions Gait training;Functional mobility training;Therapeutic activities;Therapeutic exercise;Neuromuscular re-education;Balance training;Cognitive remediation    PT Goals (Current goals can be found in the Care Plan section)  Acute Rehab PT Goals Patient Stated Goal: Spouse, I can't take care of her by myself." Pt want to return home with help. PT Goal Formulation: With patient/family Time For Goal Achievement: 07/13/22 Potential to Achieve Goals: Good    Frequency Min 2X/week     Co-evaluation   Reason for Co-Treatment: For patient/therapist safety;To address functional/ADL transfers PT goals addressed during session: Mobility/safety with mobility;Balance;Proper use of DME OT goals addressed during session: ADL's and self-care;Proper use of Adaptive equipment and DME       AM-PAC PT "6 Clicks" Mobility  Outcome Measure Help needed turning from your back to your side while in a flat bed without using bedrails?: A Lot Help needed moving from  lying on your back to sitting on the side of a flat bed without using bedrails?: A Little Help needed moving to and from a bed to a chair (including a wheelchair)?: A Little Help needed standing up from a chair using your arms (e.g., wheelchair or bedside chair)?: A Little Help needed to walk in hospital  room?: A Little Help needed climbing 3-5 steps with a railing? : A Lot 6 Click Score: 16    End of Session Equipment Utilized During Treatment: Gait belt (sling to RUE) Activity Tolerance: Patient tolerated treatment well Patient left: in chair;with call bell/phone within reach;with chair alarm set;with family/visitor present Nurse Communication: Mobility status;Weight bearing status PT Visit Diagnosis: Unsteadiness on feet (R26.81);History of falling (Z91.81);Muscle weakness (generalized) (M62.81);Difficulty in walking, not elsewhere classified (R26.2)    Time: 1133-1200 PT Time Calculation (min) (ACUTE ONLY): 27 min   Charges:   PT Evaluation $PT Eval Low Complexity: 1 Low PT Treatments $Gait Training: 8-22 mins       Alyana Kreiter PT DPT 1:36 PM,06/29/22

## 2022-06-29 NOTE — TOC Progression Note (Signed)
Transition of Care [facility]) - Progression Note    Patient Details  Name: Ceaira Ernster MRN: [id number] Date of Birth: Mar 04, 1941  [facility]) CM/SW [place], RN Phone Number: 06/29/2022, 12:12 PM  Clinical Narrative:     Met with the patient and her husband in the room She has been a hospice patient 3 years ago she is on 4 liters of Oxygen at baseline She has declined over the past year and does not appear to be rehabable She has been to [facility] in the past The Husband asked if Hospice could come to eval  [facility]  I reached out to [facility] and asked them to eval  Expected Discharge Plan: Home w Hospice Care Barriers to Discharge: Continued Medical Work up  Expected Discharge Plan and Services Expected Discharge Plan: [place] arrangements for the past 2 months: Single Family Home                                       Social Determinants of Health (SDOH) Interventions    Readmission Risk Interventions     No data to display

## 2022-06-30 ENCOUNTER — Telehealth: Payer: Self-pay | Admitting: Family Medicine

## 2022-06-30 MED ORDER — HYDRALAZINE HCL 20 MG/ML IJ SOLN
5.0000 mg | Freq: Once | INTRAMUSCULAR | Status: AC
  Administered 2022-06-30: 5 mg via INTRAVENOUS
  Filled 2022-06-30: qty 1

## 2022-06-30 MED ORDER — LEVETIRACETAM 750 MG PO TABS: 750.0000 mg | ORAL_TABLET | Freq: Two times a day (BID) | ORAL | 0 refills | Status: AC

## 2022-06-30 NOTE — Discharge Summary (Signed)
Physician Discharge Summary  Gabriella Wiggins ZSW:[id number] DOB: 26-Feb-1941 DOA: 06/27/2022  PCP: Juline Patch, MD  Admit date: 06/27/2022 Discharge date: 06/30/2022  Admitted From: Home Disposition:  Home with hospice  Recommendations for Outpatient Follow-up:  Per outpatient hospice MD   Home Health: N/A Equipment/Devices: Oxygen 3 L nasal cannula  Discharge Condition: Hospice CODE STATUS: DNR Diet recommendation: Comfort/regular  Brief/Interim Summary: Gabriella Wiggins is a 81 y.o. female with past medical history significant for dementia, COPD, chronic hypoxic respiratory failure on 4 L/min oxygen via nasal cannula, renal artery stenosis, tobacco use disorder, history of breast cancer status post left lumpectomy, aortic atherosclerosis, who presented to the hospital with right arm injury and pain following a mechanical fall at home (Friday, 06/26/2022).   She was found to have minimally displaced oblique fracture of the proximal humeral surgical neck.  She was treated with analgesics.  There was concern for AKI so she was also treated with IV fluids.  However, it appears patient has underlying CKD stage IIIa.  She was treated with IV ceftriaxone for suspected UTI.  10/4: Patient discharged home with hospice services    Discharge Diagnoses:  Principal Problem:   Fall at home, initial encounter Active Problems:   Humerus head fracture, right, closed, initial encounter   UTI (urinary tract infection)   Centrilobular emphysema ([place])   Atherosclerosis of native coronary artery of native heart with angina pectoris ([place])   Protein-calorie malnutrition, severe   Seizure ([place])   Chronic respiratory failure with hypoxia, on home O2 therapy ([place])  Closed right humeral head fracture, s/p fall at home: Analgesics as needed for pain.  Right arm is supported in a sling.  Outpatient follow-up with Dr. Leim Fabry, orthopedic surgeon, if desired.   Suspected UTI: Was on IV ceftriaxone.   Transition to p.o. ciprofloxacin.  Complete additional 2 days for total 3-day antibiotic course   COPD with chronic hypoxic respiratory failure: Continue 4 L/min oxygen via nasal cannula.   Seizure disorder: Continue Keppra   CKD stage IIIa: Creatinine is stable.  No AKI.  Last creatinine on record prior to admission was 0.82 on 05/21/2019.   Patient has been evaluated by the hospice team with plan to discharge home today with hospice.  Discharge Instructions  Discharge Instructions     Diet - low sodium heart healthy   Complete by: As directed    Increase activity slowly   Complete by: As directed       Allergies as of 06/30/2022       Reactions   Penicillin G Other (See Comments)   Penicillins Hives   Has patient had a PCN reaction causing immediate rash, facial/tongue/throat swelling, SOB or lightheadedness with hypotension: No Has patient had a PCN reaction causing severe rash involving mucus membranes or skin necrosis: No Has patient had a PCN reaction that required hospitalization: No Has patient had a PCN reaction occurring within the last 10 years: No If all of the above answers are "NO", then may proceed with Cephalosporin use.   Shrimp [shellfish Allergy] Swelling        Medication List     STOP taking these medications    aspirin EC 81 MG tablet   cholecalciferol 25 MCG (1000 UNIT) tablet Commonly known as: VITAMIN D3   ezetimibe 10 MG tablet Commonly known as: Zetia   furosemide 20 MG tablet Commonly known as: LASIX   pyridOXINE 100 MG tablet Commonly known as: VITAMIN B6  TAKE these medications    acetaminophen 325 MG tablet Commonly known as: TYLENOL Take 1 tablet (325 mg total) by mouth every 6 (six) hours as needed for mild pain (or Fever >/= 101).   Breo Ellipta 100-25 MCG/ACT Aepb Generic drug: fluticasone furoate-vilanterol Inhale 1 puff into the lungs daily.   Brovana 15 MCG/2ML Nebu Generic drug: arformoterol INHALE 2 MLS  (15 MCG TOTAL) INTO THE LUNGS 2 (TWO) TIMES DAILY.   budesonide 0.5 MG/2ML nebulizer solution Commonly known as: PULMICORT Inhale into the lungs.   carboxymethylcellulose 0.5 % Soln Commonly known as: REFRESH PLUS Place 1 drop into both eyes 2 (two) times daily as needed (dry eyes).   ipratropium-albuterol 0.5-2.5 (3) MG/3ML Soln Commonly known as: DUONEB USE 1 VIAL VIA NEBULIZER EVERY 6 HOURS AS NEEDED   Combivent Respimat 20-100 MCG/ACT Aers respimat Generic drug: Ipratropium-Albuterol INHALE 1 PUFF INTO THE LUNGS 4 TIMES A DAY   levETIRAcetam 750 MG tablet Commonly known as: KEPPRA Take 1 tablet (750 mg total) by mouth 2 (two) times daily.   ProAir HFA 108 (90 Base) MCG/ACT inhaler Generic drug: albuterol INHALE 1-2 PUFFS INTO THE LUNGS EVERY 6 HOURS AS NEEDED.        Allergies  Allergen Reactions   Penicillin G Other (See Comments)   Penicillins Hives    Has patient had a PCN reaction causing immediate rash, facial/tongue/throat swelling, SOB or lightheadedness with hypotension: No Has patient had a PCN reaction causing severe rash involving mucus membranes or skin necrosis: No Has patient had a PCN reaction that required hospitalization: No Has patient had a PCN reaction occurring within the last 10 years: No If all of the above answers are "NO", then may proceed with Cephalosporin use.    Shrimp [Shellfish Allergy] Swelling    Consultations: Orthopedics   Procedures/Studies: CT HEAD WO CONTRAST (5MM)  Result Date: 06/27/2022 CLINICAL DATA:  Head trauma, moderate-severe; Neck trauma (Age >= 65y) EXAM: CT HEAD WITHOUT CONTRAST CT CERVICAL SPINE WITHOUT CONTRAST TECHNIQUE: Multidetector CT imaging of the head and cervical spine was performed following the standard protocol without intravenous contrast. Multiplanar CT image reconstructions of the cervical spine were also generated. RADIATION DOSE REDUCTION: This exam was performed according to the departmental  dose-optimization program which includes automated exposure control, adjustment of the mA and/or kV according to patient size and/or use of iterative reconstruction technique. COMPARISON:  None Available. FINDINGS: CT HEAD FINDINGS BRAIN: BRAIN Prominence of the lateral ventricles may be related to central predominant atrophy, although a component of normal pressure/communicating hydrocephalus cannot be excluded. Patchy and confluent areas of decreased attenuation are noted throughout the deep and periventricular white matter of the cerebral hemispheres bilaterally, compatible with chronic microvascular ischemic disease. No evidence of large-territorial acute infarction. No parenchymal hemorrhage. No mass lesion. No extra-axial collection. No mass effect or midline shift. No hydrocephalus. Basilar cisterns are patent. Vascular: No hyperdense vessel. Skull: No acute fracture or focal lesion. Sinuses/Orbits: Paranasal sinuses and mastoid air cells are clear. Bilateral lens replacements. Otherwise the orbits are unremarkable. Other: None. CT CERVICAL SPINE FINDINGS Alignment: Exaggerated lordotic curvature the cervical spine centered at the C6 level. Skull base and vertebrae: Multilevel mild-to-moderate degenerative changes of the spine. Associated multilevel moderate severe osseous neural foraminal stenosis. No severe osseous central canal stenosis. No acute fracture. No aggressive appearing focal osseous lesion or focal pathologic process. Soft tissues and spinal canal: No prevertebral fluid or swelling. No visible canal hematoma. Upper chest: Partially visualized biapical, left greater than  right, pleural/pulmonary scarring with associated calcifications. Severe edematous changes. Other: None. IMPRESSION: 1. No acute intracranial abnormality. 2. Prominence of the lateral ventricles may be related to central predominant atrophy, although a component of normal pressure/communicating hydrocephalus cannot be excluded.  3. No acute displaced fracture or traumatic listhesis of the cervical spine. 4. Exaggerated lordotic curvature of the cervical spine and multilevel mild-to-moderate degenerative changes of the spine with associated multilevel moderate severe osseous neural foraminal stenosis. 5. Emphysema (ICD10-J43.9) - severe. Partially visualized biapical, left greater than right, pleural/pulmonary scarring with associated calcifications. Electronically Signed   By: Iven Finn M.D.   On: 06/27/2022 16:54   CT Cervical Spine Wo Contrast  Result Date: 06/27/2022 CLINICAL DATA:  Head trauma, moderate-severe; Neck trauma (Age >= 65y) EXAM: CT HEAD WITHOUT CONTRAST CT CERVICAL SPINE WITHOUT CONTRAST TECHNIQUE: Multidetector CT imaging of the head and cervical spine was performed following the standard protocol without intravenous contrast. Multiplanar CT image reconstructions of the cervical spine were also generated. RADIATION DOSE REDUCTION: This exam was performed according to the departmental dose-optimization program which includes automated exposure control, adjustment of the mA and/or kV according to patient size and/or use of iterative reconstruction technique. COMPARISON:  None Available. FINDINGS: CT HEAD FINDINGS BRAIN: BRAIN Prominence of the lateral ventricles may be related to central predominant atrophy, although a component of normal pressure/communicating hydrocephalus cannot be excluded. Patchy and confluent areas of decreased attenuation are noted throughout the deep and periventricular white matter of the cerebral hemispheres bilaterally, compatible with chronic microvascular ischemic disease. No evidence of large-territorial acute infarction. No parenchymal hemorrhage. No mass lesion. No extra-axial collection. No mass effect or midline shift. No hydrocephalus. Basilar cisterns are patent. Vascular: No hyperdense vessel. Skull: No acute fracture or focal lesion. Sinuses/Orbits: Paranasal sinuses and  mastoid air cells are clear. Bilateral lens replacements. Otherwise the orbits are unremarkable. Other: None. CT CERVICAL SPINE FINDINGS Alignment: Exaggerated lordotic curvature the cervical spine centered at the C6 level. Skull base and vertebrae: Multilevel mild-to-moderate degenerative changes of the spine. Associated multilevel moderate severe osseous neural foraminal stenosis. No severe osseous central canal stenosis. No acute fracture. No aggressive appearing focal osseous lesion or focal pathologic process. Soft tissues and spinal canal: No prevertebral fluid or swelling. No visible canal hematoma. Upper chest: Partially visualized biapical, left greater than right, pleural/pulmonary scarring with associated calcifications. Severe edematous changes. Other: None. IMPRESSION: 1. No acute intracranial abnormality. 2. Prominence of the lateral ventricles may be related to central predominant atrophy, although a component of normal pressure/communicating hydrocephalus cannot be excluded. 3. No acute displaced fracture or traumatic listhesis of the cervical spine. 4. Exaggerated lordotic curvature of the cervical spine and multilevel mild-to-moderate degenerative changes of the spine with associated multilevel moderate severe osseous neural foraminal stenosis. 5. Emphysema (ICD10-J43.9) - severe. Partially visualized biapical, left greater than right, pleural/pulmonary scarring with associated calcifications. Electronically Signed   By: Iven Finn M.D.   On: 06/27/2022 16:54   DG Chest Portable 1 View  Result Date: 06/27/2022 CLINICAL DATA:  Fall with bruising to right upper arm 2 days ago. EXAM: PORTABLE CHEST 1 VIEW COMPARISON:  Chest two views 06/06/2018 and 09/22/2017; CT chest 08/31/2018 FINDINGS: The patient's chin obscures the superomedial aspect of the bilateral lungs. There is flattening of the diaphragms and high-grade hyperinflation. Increased lucencies within the bilateral lungs are again seen  consistent with the high-grade centrilobular emphysematous changes on prior CT. Moderate bilateral apical pleural thickening/scarring. Cardiac silhouette and mediastinal contours are within normal  limits. Moderate vascular calcifications. Unchanged mild bibasilar interstitial thickening. No pleural effusion or pneumothorax. Severe left L2-3 disc space narrowing is similar to prior. Approximately 12 mm right lateral listhesis of L3 on L4 is unchanged. There appears to be cement density overlying the midthoracic spine on single frontal view, possibly kyphoplasty cement. IMPRESSION: 1. No acute cardiopulmonary disease. 2. Unchanged high-grade hyperinflation and emphysema. 3. Unchanged bilateral apical pleural thickening/scarring. 4. 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 Electronically Signed   By: Yvonne Kendall M.D.   On: 06/27/2022 15:53   DG Humerus Right  Result Date: 06/27/2022 CLINICAL DATA:  Bruising to right upper arm. Right shoulder appears possibly out of place, lower than left shoulder. EXAM: RIGHT HUMERUS - 2+ VIEW COMPARISON:  None Available. FINDINGS: There is diffuse decreased bone mineralization. There is an oblique fracture within the proximal humeral surgical neck with mild approximate 3 mm cortical step-off at the medial aspect. There is also a fracture of the greater tuberosity with slightly greater, likely 9 mm displacement. The glenohumeral joint remains normally located. Mild acromioclavicular joint space narrowing and peripheral osteophytosis. IMPRESSION: 1. Minimally displaced oblique fracture of the proximal humeral surgical neck. 2. Mildly displaced fracture of the humeral greater tuberosity. Electronically Signed   By: Yvonne Kendall M.D.   On: 06/27/2022 15:48      Subjective: Seen and examined on day of discharge.  Appropriate for discharge home with hospice services.  Discharge Exam: Vitals:   06/30/22 0912 06/30/22 1438  BP: 137/81 (!) 173/93  Pulse: (!) 56 (!) 51   Resp: 18 16  Temp: 97.9 F (36.6 C) 98.2 F (36.8 C)  SpO2: 98% 100%   Vitals:   06/30/22 0025 06/30/22 0728 06/30/22 0912 06/30/22 1438  BP: (!) 174/92  137/81 (!) 173/93  Pulse: 95  (!) 56 (!) 51  Resp: '18  18 16  '$ Temp: 97.9 F (36.6 C)  97.9 F (36.6 C) 98.2 F (36.8 C)  TempSrc:      SpO2: 100% 97% 98% 100%  Weight:      Height:          The results of significant diagnostics from this hospitalization (including imaging, microbiology, ancillary and laboratory) are listed below for reference.     Microbiology: Recent Results (from the past 240 hour(s))  Urine Culture     Status: None   Collection Time: 06/27/22  5:39 PM   Specimen: Urine, Random  Result Value Ref Range Status   Specimen Description   Final    URINE, RANDOM Performed at [facility], [address] [place], [place] [address]    Special Requests   Final    NONE Performed at [facility], [address] [place], [place] [address]    Culture   Final    NO GROWTH Performed at [facility], [place] [address] [place], [place] [address]    Report Status 06/29/2022 FINAL  Final  SARS Coronavirus 2 by RT PCR (hospital order, performed in [facility] hospital lab) *cepheid single result test* Anterior Nasal Swab     Status: None   Collection Time: 06/27/22  6:18 PM   Specimen: Anterior Nasal Swab  Result Value Ref Range Status   SARS Coronavirus 2 by RT PCR NEGATIVE NEGATIVE Final    Comment: (NOTE) SARS-CoV-2 target nucleic acids are NOT DETECTED.  The SARS-CoV-2 RNA is generally detectable in upper and lower respiratory specimens during the acute phase of infection. The lowest concentration of SARS-CoV-2  viral copies this assay can detect is 250 copies / mL. A negative result does not preclude SARS-CoV-2 infection and should not be used as the sole basis for treatment or other patient management decisions.  A negative result may occur with improper specimen collection /  handling, submission of specimen other than nasopharyngeal swab, presence of viral mutation(s) within the areas targeted by this assay, and inadequate number of viral copies (<250 copies / mL). A negative result must be combined with clinical observations, patient history, and epidemiological information.  Fact Sheet for Patients:   https://www.patel.info/  Fact Sheet for Healthcare Providers: https://hall.com/  This test is not yet approved or  cleared by the [place] FDA and has been authorized for detection and/or diagnosis of SARS-CoV-2 by FDA under an Emergency Use Authorization (EUA).  This EUA will remain in effect (meaning this test can be used) for the duration of the COVID-19 declaration under Section 564(b)(1) of the Act, 21 U.S.C. section 360bbb-3(b)(1), unless the authorization is terminated or revoked sooner.  Performed at [facility], [address] [place], [place] [address]      Labs: BNP (last 3 results) Recent Labs    06/27/22 1707  BNP 672.0*   Basic Metabolic Panel: Recent Labs  Lab 06/27/22 1706 06/27/22 1707 06/28/22 0525 06/29/22 0920  NA  --  140 137 139  K  --  4.7 3.5 3.7  CL  --  96* 98 93*  CO2  --  37* 36* 39*  GLUCOSE  --  116* 91 112*  BUN  --  34* 28* 22  CREATININE  --  1.11* 1.03* 1.11*  CALCIUM  --  9.4 8.8* 9.3  MG 2.2  --   --   --    Liver Function Tests: Recent Labs  Lab 06/27/22 1707 06/28/22 0525  AST 28 22  ALT 19 16  ALKPHOS 101 79  BILITOT 1.0 1.1  PROT 7.4 6.2*  ALBUMIN 4.0 3.4*   No results for input(s): "LIPASE", "AMYLASE" in the last 168 hours. No results for input(s): "AMMONIA" in the last 168 hours. CBC: Recent Labs  Lab 06/27/22 1707 06/28/22 0525 06/29/22 0928  WBC 10.0 6.2 6.2  NEUTROABS 8.8*  --   --   HGB 9.6* 7.9* 9.2*  HCT 31.6* 26.5* 29.8*  MCV 100.6* 101.1* 100.3*  PLT 213 184 213   Cardiac Enzymes: Recent Labs  Lab  06/27/22 1707  CKTOTAL 100   BNP: Invalid input(s): "POCBNP" CBG: No results for input(s): "GLUCAP" in the last 168 hours. D-Dimer No results for input(s): "DDIMER" in the last 72 hours. Hgb A1c No results for input(s): "HGBA1C" in the last 72 hours. Lipid Profile No results for input(s): "CHOL", "HDL", "LDLCALC", "TRIG", "CHOLHDL", "LDLDIRECT" in the last 72 hours. Thyroid function studies No results for input(s): "TSH", "T4TOTAL", "T3FREE", "THYROIDAB" in the last 72 hours.  Invalid input(s): "FREET3" Anemia work up No results for input(s): "VITAMINB12", "FOLATE", "FERRITIN", "TIBC", "IRON", "RETICCTPCT" in the last 72 hours. Urinalysis    Component Value Date/Time   COLORURINE YELLOW (A) 06/27/2022 1707   APPEARANCEUR CLEAR (A) 06/27/2022 1707   LABSPEC 1.016 06/27/2022 1707   PHURINE 6.0 06/27/2022 1707   GLUCOSEU NEGATIVE 06/27/2022 1707   HGBUR NEGATIVE 06/27/2022 1707   BILIRUBINUR NEGATIVE 06/27/2022 1707   BILIRUBINUR negative 05/21/2019 1502   KETONESUR NEGATIVE 06/27/2022 1707   PROTEINUR 100 (A) 06/27/2022 1707   UROBILINOGEN 0.2 05/21/2019 1502   NITRITE NEGATIVE 06/27/2022 1707  LEUKOCYTESUR SMALL (A) 06/27/2022 1707   Sepsis Labs Recent Labs  Lab 06/27/22 1707 06/28/22 0525 06/29/22 0928  WBC 10.0 6.2 6.2   Microbiology Recent Results (from the past 240 hour(s))  Urine Culture     Status: None   Collection Time: 06/27/22  5:39 PM   Specimen: Urine, Random  Result Value Ref Range Status   Specimen Description   Final    URINE, RANDOM Performed at [facility], [address] [place], [place] [address]    Special Requests   Final    NONE Performed at [facility], [address] [place], [place] [address]    Culture   Final    NO GROWTH Performed at [facility], [place] [address], [place], [place] [address]    Report Status 06/29/2022 FINAL  Final  SARS Coronavirus 2 by RT PCR (hospital order, performed in [facility] hospital lab) *cepheid single result test* Anterior Nasal Swab     Status: None   Collection Time: 06/27/22  6:18 PM   Specimen: Anterior Nasal Swab  Result Value Ref Range Status   SARS Coronavirus 2 by RT PCR NEGATIVE NEGATIVE Final    Comment: (NOTE) SARS-CoV-2 target nucleic acids are NOT DETECTED.  The SARS-CoV-2 RNA is generally detectable in upper and lower respiratory specimens during the acute phase of infection. The lowest concentration of SARS-CoV-2 viral copies this assay can detect is 250 copies / mL. A negative result does not preclude SARS-CoV-2 infection and should not be used as the sole basis for treatment or other patient management decisions.  A negative result may occur with improper specimen collection / handling, submission of specimen other than nasopharyngeal swab, presence of viral mutation(s) within the areas targeted by this assay, and inadequate number of viral copies (<250 copies / mL). A negative result must be combined with clinical observations, patient history, and epidemiological information.  Fact Sheet for Patients:   https://www.patel.info/  Fact Sheet for Healthcare Providers: https://hall.com/  This test is not yet approved or  cleared by the [place] FDA and has been authorized for detection and/or diagnosis of SARS-CoV-2 by FDA under an Emergency Use Authorization (EUA).  This EUA will remain in effect (meaning this test can be used) for the duration of the COVID-19 declaration under Section 564(b)(1) of the Act, 21 U.S.C. section 360bbb-3(b)(1), unless the authorization is terminated or revoked sooner.  Performed at [facility], [address], [place],  [address]      Time coordinating discharge: Over 30 minutes  SIGNED:   Sidney Ace, MD  [facility] 06/30/2022, 3:07 PM Pager   If 7PM-7AM, please contact night-coverage

## 2022-06-30 NOTE — Telephone Encounter (Signed)
Copied from [place] [phone]. Topic: General - Inquiry >> Jun 30, 2022 10:24 AM Erskine Squibb wrote: Reason for CRM: Judeen Hammans with [facility] called ins tating the family is requesting the patients provider to serve as the attending of record. This is time sensitive so please assist further.

## 2022-06-30 NOTE — Plan of Care (Signed)

## 2022-06-30 NOTE — Progress Notes (Signed)
[facility])    Per MD, Dr, Priscella Mann, patient medically clear for DC.  If applicable, please send signed and completed DNR with patient/family upon discharge. Please provide prescriptions at discharge as needed to ensure ongoing symptom management and a transport packet.   [facility] information and contact numbers given to family and above information shared with TOC.    Please call with any questions/concerns.    Thank you for the opportunity to participate in this patient's care   Daphene Calamity, MSW [facility] Liaison  [phone]

## 2022-06-30 NOTE — TOC Progression Note (Signed)
Transition of Care [facility]) - Progression Note    Patient Details  Name: Gabriella Wiggins MRN: [id number] Date of Birth: [birth year]  Transition of Care [facility]) CM/SW [place], RN Phone Number: 06/30/2022, 9:49 AM  Clinical Narrative:     Spoke with the husband and he is going to pick up the patient at 4 PM, the workers are at their house installing a new furnace She will go home with Hospice to follow They will provide any needed DME, She has oxygen at home She has a rolling walker and nebulizer Hospice will provide a BSC and bedside table   Expected Discharge Plan: Home w Hospice Care Barriers to Discharge: Continued Medical Work up  Expected Discharge Plan and Services Expected Discharge Plan: [place] arrangements for the past 2 months: Single Family Home Expected Discharge Date: 06/30/22                                     Social Determinants of Health (SDOH) Interventions    Readmission Risk Interventions     No data to display

## 2022-06-30 NOTE — Progress Notes (Signed)
Physical Therapy Treatment Patient Details Name: Gabriella Wiggins MRN: [id number] DOB: December 10, 1940 Today's Date: 06/30/2022   History of Present Illness Per MD Notes: Gabriella Wiggins is a 81 y.o. female with past medical history significant for dementia, COPD, chronic hypoxic respiratory failure on 4 L/min oxygen via nasal cannula, renal artery stenosis, tobacco use disorder, history of breast cancer status post left lumpectomy, aortic atherosclerosis, who presented to the hospital with right arm injury and pain following a mechanical fall at home (Friday, 06/26/2022). She was found to have minimally displaced oblique fracture of the proximal humeral surgical neck.  She was treated with analgesics.  There was concern for AKI so she was also treated with IV fluids.  However it appears patient has underlying CKD stage IIIa.  She was treated with IV ceftriaxone for suspected UTI.    PT Comments    Pt received in bed agreeable to PT interventions. Pt appeared weak but alert. Pt participated in bed mobility and transfer with HHA of 1 with Mod to max assist. Pt demonstrated R sided lean in sitting and standing with poor posture. Pt fatigued with transfers. SPO2 90% at all time. O2  via [place] in place. Pt tolerance ,limited due to fatigue and cognitive impairments. Pt will benefit from [facility] for safe household level ADLs and mobility.   Recommendations for follow up therapy are one component of a multi-disciplinary discharge planning process, led by the attending physician.  Recommendations may be updated based on patient status, additional functional criteria and insurance authorization.  Follow Up Recommendations  Other (comment) (Max HH)     Assistance Recommended at Discharge Other (comment)  Patient can return home with the following A little help with walking and/or transfers;A lot of help with bathing/dressing/bathroom;Assistance with cooking/housework;Assistance with feeding;Direct supervision/assist for  medications management;Direct supervision/assist for financial management;Assist for transportation;Help with stairs or ramp for entrance   Equipment Recommendations  Wheelchair (measurements PT)    Recommendations for Other Services       Precautions / Restrictions Precautions Precautions: Fall Required Braces or Orthoses: Sling Restrictions Weight Bearing Restrictions: Yes RUE Weight Bearing: Non weight bearing     Mobility  Bed Mobility Overal bed mobility: Needs Assistance Bed Mobility: Supine to Sit     Supine to sit: Max assist, Mod assist          Transfers Overall transfer level: Needs assistance Equipment used: 1 person hand held assist Transfers: Sit to/from Stand, Bed to chair/wheelchair/BSC Sit to Stand: Mod assist Stand pivot transfers: Mod assist         General transfer comment: cues for hand placement    Ambulation/Gait                   Stairs             Wheelchair Mobility    Modified Rankin (Stroke Patients Only)       Balance Overall balance assessment: Needs assistance Sitting-balance support: Feet supported, Single extremity supported Sitting balance-Leahy Scale: Fair Sitting balance - Comments: Pt noted to have difficulty adjusting position. Increased L lateral lean during static sitting. Postural control: Right lateral lean   Standing balance-Leahy Scale: Fair                              Cognition Arousal/Alertness: Awake/alert Behavior During Therapy: Flat affect Overall Cognitive Status: Impaired/Different from baseline Area of Impairment: Following commands, Orientation, Memory, Safety/judgement  Orientation Level: Disoriented to, Time, Place, Situation   Memory: Decreased recall of precautions, Decreased short-term memory Following Commands: Follows one step commands with increased time Safety/Judgement: Decreased awareness of safety, Decreased awareness of  deficits     General Comments: Pt spouse at bedside states pt is not at baseline for cognition.        Exercises      General Comments        Pertinent Vitals/Pain Pain Assessment Pain Assessment: No/denies pain    Home Living                          Prior Function            PT Goals (current goals can now be found in the care plan section) Acute Rehab PT Goals PT Goal Formulation: Patient unable to participate in goal setting Time For Goal Achievement: 07/13/22 Potential to Achieve Goals: Fair    Frequency    Min 2X/week      PT Plan Current plan remains appropriate    Co-evaluation              AM-PAC PT "6 Clicks" Mobility   Outcome Measure  Help needed turning from your back to your side while in a flat bed without using bedrails?: A Lot Help needed moving from lying on your back to sitting on the side of a flat bed without using bedrails?: A Lot Help needed moving to and from a bed to a chair (including a wheelchair)?: A Lot Help needed standing up from a chair using your arms (e.g., wheelchair or bedside chair)?: A Lot Help needed to walk in hospital room?: A Lot Help needed climbing 3-5 steps with a railing? : Total 6 Click Score: 11    End of Session Equipment Utilized During Treatment: Gait belt Activity Tolerance: Patient tolerated treatment well Patient left: in chair;with call bell/phone within reach;with chair alarm set;with family/visitor present Nurse Communication: Mobility status;Weight bearing status PT Visit Diagnosis: Unsteadiness on feet (R26.81);History of falling (Z91.81);Muscle weakness (generalized) (M62.81);Difficulty in walking, not elsewhere classified (R26.2)     Time: 1749-4496 PT Time Calculation (min) (ACUTE ONLY): 12 min  Charges:  $Therapeutic Activity: 8-22 mins                    Tailey Top PT DPT 11:23 AM,06/30/22

## 2022-07-01 ENCOUNTER — Telehealth: Payer: Self-pay

## 2022-07-01 NOTE — Telephone Encounter (Signed)
Called Sherry with Hospice and let her know Ronnald Ramp would be unable to be attending MD for the pt. She will proceed under care of facility MD

## 2022-07-31 ENCOUNTER — Other Ambulatory Visit: Payer: Self-pay | Admitting: Family Medicine

## 2022-07-31 DIAGNOSIS — Z789 Other specified health status: Secondary | ICD-10-CM

## 2022-07-31 DIAGNOSIS — E7801 Familial hypercholesterolemia: Secondary | ICD-10-CM

## 2022-08-02 NOTE — Telephone Encounter (Signed)
Discontinued 06/30/2022 not on med list. Requested Prescriptions  Pending Prescriptions Disp Refills   ezetimibe (ZETIA) 10 MG tablet [Pharmacy Med Name: EZETIMIBE 10 MG TABLET] 90 tablet 1    Sig: TAKE 1 TABLET BY MOUTH EVERY DAY     Cardiovascular:  Antilipid - Sterol Transport Inhibitors Failed - 07/31/2022  8:45 AM      Failed - Lipid Panel in normal range within the last 12 months    Cholesterol, Total  Date Value Ref Range Status  10/08/2015 244 (H) 100 - 199 mg/dL Final   Cholesterol  Date Value Ref Range Status  09/01/2018 150 0 - 200 mg/dL Final   LDL Calculated  Date Value Ref Range Status  10/08/2015 153 (H) 0 - 99 mg/dL Final   LDL Cholesterol  Date Value Ref Range Status  09/01/2018 111 (H) 0 - 99 mg/dL Final    Comment:           Total Cholesterol/HDL:CHD Risk Coronary Heart Disease Risk Table                     Men   Women  1/2 Average Risk   3.4   3.3  Average Risk       5.0   4.4  2 X Average Risk   9.6   7.1  3 X Average Risk  23.4   11.0        Use the calculated Patient Ratio above and the CHD Risk Table to determine the patient's CHD Risk.        ATP III CLASSIFICATION (LDL):  <100     mg/dL   Optimal  100-129  mg/dL   Near or Above                    Optimal  130-159  mg/dL   Borderline  160-189  mg/dL   High  >190     mg/dL   Very High Performed at [facility], [place], [place] [address]    HDL  Date Value Ref Range Status  09/01/2018 21 (L) >40 mg/dL Final  10/08/2015 71 >39 mg/dL Final   Triglycerides  Date Value Ref Range Status  09/01/2018 91 <150 mg/dL Final         Passed - AST in normal range and within 360 days    AST  Date Value Ref Range Status  06/28/2022 22 15 - 41 U/L Final         Passed - ALT in normal range and within 360 days    ALT  Date Value Ref Range Status  06/28/2022 16 0 - 44 U/L Final         Passed - Patient is not pregnant      Passed - Valid encounter within last 12  months    Recent Outpatient Visits           3 months ago Centrilobular emphysema ([place])   [place] [facility] [place], Deanna C, MD   2 years ago Centrilobular emphysema [facility])   [place] [facility] [place], Deanna C, MD   2 years ago Centrilobular emphysema [facility])   [place] [facility] [place], Deanna C, MD   3 years ago Pain of upper abdomen   [facility] at [place], Deanna C, MD  4 years ago Weight loss   [facility] at [facility], MD

## 2023-01-12 ENCOUNTER — Telehealth: Payer: Self-pay | Admitting: Family Medicine

## 2023-01-12 NOTE — Telephone Encounter (Signed)
Left voice mail to set up appt.

## 2023-03-28 DEATH — deceased

## 2023-05-09 ENCOUNTER — Encounter: Payer: Self-pay | Admitting: *Deleted
